# Patient Record
Sex: Female | Born: 1935 | Race: White | Hispanic: No | Smoking: Former smoker
Health system: Southern US, Community
[De-identification: ages and names within clinical notes are randomized; demographics above are authoritative.]

## PROBLEM LIST (undated history)

## (undated) DIAGNOSIS — D649 Anemia, unspecified: Secondary | ICD-10-CM

## (undated) DIAGNOSIS — I999 Unspecified disorder of circulatory system: Secondary | ICD-10-CM

## (undated) DIAGNOSIS — H9190 Unspecified hearing loss, unspecified ear: Secondary | ICD-10-CM

## (undated) DIAGNOSIS — R251 Tremor, unspecified: Secondary | ICD-10-CM

## (undated) DIAGNOSIS — M1990 Unspecified osteoarthritis, unspecified site: Secondary | ICD-10-CM

## (undated) DIAGNOSIS — R059 Cough, unspecified: Secondary | ICD-10-CM

## (undated) DIAGNOSIS — E039 Hypothyroidism, unspecified: Secondary | ICD-10-CM

## (undated) DIAGNOSIS — K219 Gastro-esophageal reflux disease without esophagitis: Secondary | ICD-10-CM

## (undated) DIAGNOSIS — I10 Essential (primary) hypertension: Secondary | ICD-10-CM

## (undated) DIAGNOSIS — F0390 Unspecified dementia without behavioral disturbance: Secondary | ICD-10-CM

## (undated) DIAGNOSIS — I4891 Unspecified atrial fibrillation: Secondary | ICD-10-CM

## (undated) DIAGNOSIS — J449 Chronic obstructive pulmonary disease, unspecified: Secondary | ICD-10-CM

## (undated) DIAGNOSIS — R05 Cough: Secondary | ICD-10-CM

## (undated) DIAGNOSIS — I872 Venous insufficiency (chronic) (peripheral): Secondary | ICD-10-CM

## (undated) HISTORY — DX: Unspecified disorder of circulatory system: I99.9

## (undated) HISTORY — DX: Venous insufficiency (chronic) (peripheral): I87.2

## (undated) HISTORY — PX: ABDOMINAL HYSTERECTOMY: SHX81

---

## 2004-01-20 ENCOUNTER — Ambulatory Visit: Payer: Self-pay | Admitting: Unknown Physician Specialty

## 2005-01-20 ENCOUNTER — Ambulatory Visit: Payer: Self-pay | Admitting: Unknown Physician Specialty

## 2005-02-11 ENCOUNTER — Ambulatory Visit: Payer: Self-pay | Admitting: Unknown Physician Specialty

## 2005-02-22 ENCOUNTER — Other Ambulatory Visit: Payer: Self-pay

## 2005-02-22 ENCOUNTER — Emergency Department: Payer: Self-pay | Admitting: Unknown Physician Specialty

## 2006-03-18 ENCOUNTER — Emergency Department: Payer: Self-pay | Admitting: Emergency Medicine

## 2006-03-18 ENCOUNTER — Other Ambulatory Visit: Payer: Self-pay

## 2007-01-01 ENCOUNTER — Emergency Department: Payer: Self-pay | Admitting: Emergency Medicine

## 2007-01-01 ENCOUNTER — Other Ambulatory Visit: Payer: Self-pay

## 2007-03-17 ENCOUNTER — Other Ambulatory Visit: Payer: Self-pay

## 2007-03-17 ENCOUNTER — Inpatient Hospital Stay: Payer: Self-pay | Admitting: Cardiology

## 2007-12-20 ENCOUNTER — Ambulatory Visit: Payer: Self-pay | Admitting: Unknown Physician Specialty

## 2011-05-24 ENCOUNTER — Ambulatory Visit: Payer: Self-pay | Admitting: Unknown Physician Specialty

## 2011-12-30 ENCOUNTER — Ambulatory Visit: Payer: Self-pay | Admitting: Unknown Physician Specialty

## 2013-05-21 ENCOUNTER — Inpatient Hospital Stay: Payer: Self-pay | Admitting: Internal Medicine

## 2013-05-21 ENCOUNTER — Ambulatory Visit: Payer: Self-pay

## 2013-05-21 LAB — URINALYSIS, COMPLETE
BILIRUBIN, UR: NEGATIVE
Glucose,UR: NEGATIVE mg/dL (ref 0–75)
KETONE: NEGATIVE
Leukocyte Esterase: NEGATIVE
Nitrite: NEGATIVE
PH: 6.5 (ref 4.5–8.0)
Protein: 30
SPECIFIC GRAVITY: 1.02 (ref 1.003–1.030)

## 2013-05-21 LAB — CBC
HCT: 32.5 % — AB (ref 35.0–47.0)
HGB: 11.2 g/dL — ABNORMAL LOW (ref 12.0–16.0)
MCH: 31.1 pg (ref 26.0–34.0)
MCHC: 34.6 g/dL (ref 32.0–36.0)
MCV: 90 fL (ref 80–100)
Platelet: 251 10*3/uL (ref 150–440)
RBC: 3.61 10*6/uL — ABNORMAL LOW (ref 3.80–5.20)
RDW: 12.5 % (ref 11.5–14.5)
WBC: 17.8 10*3/uL — ABNORMAL HIGH (ref 3.6–11.0)

## 2013-05-21 LAB — COMPREHENSIVE METABOLIC PANEL
ALK PHOS: 94 U/L
ALT: 18 U/L (ref 12–78)
AST: 15 U/L (ref 15–37)
Albumin: 2.9 g/dL — ABNORMAL LOW (ref 3.4–5.0)
Anion Gap: 5 — ABNORMAL LOW (ref 7–16)
BILIRUBIN TOTAL: 1 mg/dL (ref 0.2–1.0)
BUN: 15 mg/dL (ref 7–18)
CALCIUM: 8.9 mg/dL (ref 8.5–10.1)
CHLORIDE: 96 mmol/L — AB (ref 98–107)
CO2: 30 mmol/L (ref 21–32)
Creatinine: 0.61 mg/dL (ref 0.60–1.30)
EGFR (Non-African Amer.): >60
Glucose: 111 mg/dL — ABNORMAL HIGH (ref 65–99)
OSMOLALITY: 264 (ref 275–301)
Potassium: 2.9 mmol/L — ABNORMAL LOW (ref 3.5–5.1)
Sodium: 131 mmol/L — ABNORMAL LOW (ref 136–145)
Total Protein: 6.5 g/dL (ref 6.4–8.2)

## 2013-05-21 LAB — TSH: Thyroid Stimulating Horm: 0.211 u[IU]/mL — ABNORMAL LOW

## 2013-05-21 LAB — PRO B NATRIURETIC PEPTIDE: B-TYPE NATIURETIC PEPTID: 1760 pg/mL — AB (ref 0–450)

## 2013-05-21 LAB — TROPONIN I

## 2013-05-21 LAB — MAGNESIUM: MAGNESIUM: 1.4 mg/dL — AB

## 2013-05-21 LAB — DIGOXIN LEVEL: Digoxin: 0.82 ng/mL

## 2013-05-21 LAB — RAPID INFLUENZA A&B ANTIGENS (ARMC ONLY)

## 2013-05-21 LAB — T4, FREE: FREE THYROXINE: 1.27 ng/dL (ref 0.76–1.46)

## 2013-05-22 LAB — CBC WITH DIFFERENTIAL/PLATELET
BASOS PCT: 0.3 %
Basophil #: 0 10*3/uL (ref 0.0–0.1)
EOS PCT: 0.4 %
Eosinophil #: 0.1 10*3/uL (ref 0.0–0.7)
HCT: 31.7 % — ABNORMAL LOW (ref 35.0–47.0)
HGB: 11 g/dL — ABNORMAL LOW (ref 12.0–16.0)
LYMPHS ABS: 1.4 10*3/uL (ref 1.0–3.6)
Lymphocyte %: 9.6 %
MCH: 31 pg (ref 26.0–34.0)
MCHC: 34.8 g/dL (ref 32.0–36.0)
MCV: 89 fL (ref 80–100)
MONO ABS: 1.3 x10 3/mm — AB (ref 0.2–0.9)
Monocyte %: 9.3 %
Neutrophil #: 11.4 10*3/uL — ABNORMAL HIGH (ref 1.4–6.5)
Neutrophil %: 80.4 %
Platelet: 245 10*3/uL (ref 150–440)
RBC: 3.56 10*6/uL — ABNORMAL LOW (ref 3.80–5.20)
RDW: 12.5 % (ref 11.5–14.5)
WBC: 14.2 10*3/uL — AB (ref 3.6–11.0)

## 2013-05-22 LAB — BASIC METABOLIC PANEL
Anion Gap: 4 — ABNORMAL LOW (ref 7–16)
BUN: 9 mg/dL (ref 7–18)
CHLORIDE: 99 mmol/L (ref 98–107)
CREATININE: 0.58 mg/dL — AB (ref 0.60–1.30)
Calcium, Total: 8.1 mg/dL — ABNORMAL LOW (ref 8.5–10.1)
Co2: 31 mmol/L (ref 21–32)
EGFR (African American): >60
Glucose: 120 mg/dL — ABNORMAL HIGH (ref 65–99)
Osmolality: 268 (ref 275–301)
Potassium: 3.5 mmol/L (ref 3.5–5.1)
Sodium: 134 mmol/L — ABNORMAL LOW (ref 136–145)

## 2013-05-22 LAB — MAGNESIUM: MAGNESIUM: 2.1 mg/dL

## 2013-05-23 LAB — URINE CULTURE

## 2013-05-23 LAB — BASIC METABOLIC PANEL
ANION GAP: 3 — AB (ref 7–16)
BUN: 5 mg/dL — ABNORMAL LOW (ref 7–18)
CO2: 33 mmol/L — AB (ref 21–32)
CREATININE: 0.48 mg/dL — AB (ref 0.60–1.30)
Calcium, Total: 8.1 mg/dL — ABNORMAL LOW (ref 8.5–10.1)
Chloride: 102 mmol/L (ref 98–107)
EGFR (African American): >60
GLUCOSE: 117 mg/dL — AB (ref 65–99)
Osmolality: 274 (ref 275–301)
Potassium: 3.2 mmol/L — ABNORMAL LOW (ref 3.5–5.1)
Sodium: 138 mmol/L (ref 136–145)

## 2013-05-26 LAB — CULTURE, BLOOD (SINGLE)

## 2013-06-15 ENCOUNTER — Emergency Department: Payer: Self-pay | Admitting: Emergency Medicine

## 2013-06-22 ENCOUNTER — Emergency Department: Payer: Self-pay | Admitting: Emergency Medicine

## 2013-07-25 ENCOUNTER — Ambulatory Visit: Payer: Self-pay | Admitting: Internal Medicine

## 2013-10-11 DIAGNOSIS — Z720 Tobacco use: Secondary | ICD-10-CM | POA: Insufficient documentation

## 2014-01-08 ENCOUNTER — Emergency Department: Payer: Self-pay | Admitting: Emergency Medicine

## 2014-01-08 LAB — BASIC METABOLIC PANEL
Anion Gap: 6 — ABNORMAL LOW (ref 7–16)
BUN: 8 mg/dL (ref 7–18)
CREATININE: 0.54 mg/dL — AB (ref 0.60–1.30)
Calcium, Total: 8.7 mg/dL (ref 8.5–10.1)
Chloride: 102 mmol/L (ref 98–107)
Co2: 31 mmol/L (ref 21–32)
EGFR (African American): >60
EGFR (Non-African Amer.): >60
Glucose: 93 mg/dL (ref 65–99)
OSMOLALITY: 276 (ref 275–301)
Potassium: 3.1 mmol/L — ABNORMAL LOW (ref 3.5–5.1)
SODIUM: 139 mmol/L (ref 136–145)

## 2014-01-08 LAB — CBC WITH DIFFERENTIAL/PLATELET
Basophil #: 0.1 10*3/uL (ref 0.0–0.1)
Basophil %: 0.6 %
Eosinophil #: 0.1 10*3/uL (ref 0.0–0.7)
Eosinophil %: 0.8 %
HCT: 37.2 % (ref 35.0–47.0)
HGB: 12.3 g/dL (ref 12.0–16.0)
LYMPHS ABS: 1.5 10*3/uL (ref 1.0–3.6)
LYMPHS PCT: 16.5 %
MCH: 31.2 pg (ref 26.0–34.0)
MCHC: 33.2 g/dL (ref 32.0–36.0)
MCV: 94 fL (ref 80–100)
MONOS PCT: 10.7 %
Monocyte #: 1 x10 3/mm — ABNORMAL HIGH (ref 0.2–0.9)
NEUTROS ABS: 6.7 10*3/uL — AB (ref 1.4–6.5)
NEUTROS PCT: 71.4 %
Platelet: 181 10*3/uL (ref 150–440)
RBC: 3.95 10*6/uL (ref 3.80–5.20)
RDW: 12.8 % (ref 11.5–14.5)
WBC: 9.3 10*3/uL (ref 3.6–11.0)

## 2014-01-13 LAB — CULTURE, BLOOD (SINGLE)

## 2014-04-10 DIAGNOSIS — D3617 Benign neoplasm of peripheral nerves and autonomic nervous system of trunk, unspecified: Secondary | ICD-10-CM | POA: Diagnosis not present

## 2014-04-10 DIAGNOSIS — L280 Lichen simplex chronicus: Secondary | ICD-10-CM | POA: Diagnosis not present

## 2014-04-10 DIAGNOSIS — L720 Epidermal cyst: Secondary | ICD-10-CM | POA: Diagnosis not present

## 2014-04-10 DIAGNOSIS — D485 Neoplasm of uncertain behavior of skin: Secondary | ICD-10-CM | POA: Diagnosis not present

## 2014-05-08 DIAGNOSIS — H6123 Impacted cerumen, bilateral: Secondary | ICD-10-CM | POA: Diagnosis not present

## 2014-05-22 DIAGNOSIS — E041 Nontoxic single thyroid nodule: Secondary | ICD-10-CM | POA: Diagnosis not present

## 2014-05-29 DIAGNOSIS — E041 Nontoxic single thyroid nodule: Secondary | ICD-10-CM | POA: Diagnosis not present

## 2014-06-08 NOTE — Discharge Summary (Signed)
PATIENT NAME:  Patricia Foley, Patricia Foley MR#:  [id number] DATE OF BIRTH:  [birth year]  DATE OF ADMISSION:  05/21/2013 DATE OF DISCHARGE:  05/23/2013   ADMITTING PHYSICIAN: Ceasar Lund. Anselm Jungling, MD  DISCHARGING PHYSICIAN: Gladstone Lighter, MD  PRIMARY CARE PHYSICIAN: Used to be Dr. Kem Kays in the past, but currently will be seeing a physician from [facility].  CONSULTATIONS IN THE HOSPITAL: Cardiology consultation by Dr. Ubaldo Glassing.   DISCHARGE DIAGNOSES: 1.  Sepsis.  2.  Left lower lobe pneumonia.  3.  Diastolic congestive heart failure.  4.  Paroxysmal atrial fibrillation.  5.  Coronary artery disease.  6.  Severe hypokalemia and hypomagnesemia.   DISCHARGE HOME MEDICATIONS:  1.  Multivitamin 1 tablet p.o. daily.  2.  Calcium carbonate 600 mg p.o. daily.  3.  Fish oil capsule 1 capsule daily.  4.  Cartia 300 mg p.o. daily.  5.  Potassium chloride 20 mEq p.o. daily.  6.  Lasix 20 mg p.o. every other day.  7.  Levaquin 500 mg p.o. once a day for 7 more days.   DISCHARGE OXYGEN: None.   DISCHARGE DIET: Low-sodium diet.   DISCHARGE ACTIVITY: As tolerated.    FOLLOWUP INSTRUCTIONS: 1.  PCP followup in 1 week.  2.  Cardiology followup in 1 week.  3.  Home health physical therapy.  LABORATORY AND IMAGING STUDIES PRIOR TO DISCHARGE: WBC 14.2, hemoglobin 11.0, hematocrit 31.7, platelet count 245.   Sodium 138, potassium 3.2, chloride 102, bicarbonate 33, BUN 5, creatinine 0.48, glucose of 117.  Chest x-ray showing left lower lobe pneumonia and small left pleural effusion.   Urine Legionella antigen is negative.   Echocardiogram showing LV ejection fraction is 70-75%. Concentric LVH  noted. Moderate pleural effusion in the left lateral region noted.  Influenza test is negative. TSH is low at 0.21; however, T4 is within normal limits. Digoxin level is within normal limits. Blood cultures are negative.   BRIEF HOSPITAL COURSE: [name] is an 81 year old Caucasian  female who lives at home by herself. Past medical history of coronary artery disease, chronic atrial fibrillation. Presented to the hospital secondary to sepsis from pneumonia.  1.  Sepsis secondary to left lower lobe pneumonia: Was admitted. Blood cultures were negative. Was on antibiotics with Levaquin. She was initially requiring oxygen at 2 liters, and with the antibiotics, her symptoms have improved significantly. She is off oxygen, ambulating well on room air, and is being discharged on p.o. Levaquin at this time.  2.  Chronic paroxysmal atrial fibrillation: She did have severe electrolyte abnormalities when she came in. Magnesium and potassium were low, which were replaced. Thyroid levels were within normal limits. She is currently converted back to sinus with some arrhythmia and was seen by Dr. Ubaldo Glassing. She is on Cardizem at home and also digoxin, which are being continued. However, her echo shows EF is 70-75%. She did have history of ischemic heart disease. She also has left pleural effusion, for which she is on p.o. Lasix, which has been changed to every other day now. She is also being discharged on potassium supplements.   The patient has done while she is in the hospital. She worked with physical therapy who recommended home health PT.   DISCHARGE CONDITION: Stable.   DISCHARGE DISPOSITION: Home with home health.  TIME SPENT ON DISCHARGE: 40 minutes.   ____________________________ Gladstone Lighter, MD rk:jcm D: 05/23/2013 14:35:02 ET T: 05/23/2013 15:29:16 ET JOB#: [id number]  cc: Gladstone Lighter, MD, <Dictator> Gladstone Lighter MD ELECTRONICALLY  SIGNED 06/05/2013 11:20

## 2014-06-08 NOTE — H&P (Signed)
PATIENT NAME:  Patricia Foley, Patricia Foley MR#:  [id number] DATE OF BIRTH:  [birth year]  DATE OF ADMISSION:  05/21/2013  PRIMARY CARE PHYSICIAN: In the past was seeing Dr. Doy Hutching and Kem Kays, but had not seen them for the last few years; therefore, she is seeing me.    REFERRING PHYSICIAN: Dr. Jasmine December.   CHIEF COMPLAINT: Cough and tachycardia.   HISTORY OF PRESENT ILLNESS: An 81 year old female who has past history of atrial fibrillation who had complaint of feeling fatigued, upper respiratory infection symptoms, like cough and somewhat short of breath, persisted to feel tired, gradually getting worse with some fever now, and so she was taken to [facility] [place] where they did chest x-ray and checked her vitals.  Her chest x-ray was positive for pneumonia and she had fever of 101.4 degrees Fahrenheit. She also had tachycardia over there of 128 heart rate, and so she was sent to Emergency Room for further management. In the ER, because of her tachycardia, which was found on EKG atrial flutter, she was given injection of Cardizem 10 mg and to that she responded; her heart rate came down to 90s and 100, but her blood pressure dropped, systolic up to 80, and so they started her on some IV fluids and antibiotic for pneumonia and called hospitalist service for further management. On further questioning, she said that yes to fever and chills. She is a smoker and was feeling somewhat short of breath for the last few weeks. Denies any chest pain or palpitation history.   REVIEW OF SYSTEMS: CONSTITUTIONAL: Negative for fever, fatigue, weakness. No pain or weight loss.  EYES: No blurring, double vision, discharge or redness.  EARS, NOSE, THROAT: No tinnitus, ear pain. No hearing loss.  RESPIRATORY: The patient had some cough and somewhat tightness in the chest with shortness of breath.  CARDIOVASCULAR: No chest pain, orthopnea, edema, arrhythmia, palpitations.  GASTROINTESTINAL: No nausea, vomiting,  diarrhea or abdominal pain.  GENITOURINARY: No dysuria, hematuria or increased frequency.  ENDOCRINE: No heat or cold intolerance.  SKIN: No acne or rashes or any lesions.  MUSCULOSKELETAL: No pain or swelling in the joints.  NEUROLOGICAL: No numbness, weakness, tremor or vertigo.  PSYCHIATRIC: No anxiety, insomnia, bipolar disorder.   PAST MEDICAL HISTORY:  Atrial fibrillation, history of ischemic heart disease.   SOCIAL HISTORY: Lives at home. She is a smoker but does not require oxygen at home. Denies alcohol or illegal drug use.   FAMILY HISTORY: Sister, mother and father all had coronary artery disease.   HOME MEDICATIONS: As per son:  1.  Aspirin 325 mg once a day.  2.  Cardizem 300 mg 24-hour capsule once a day.  3.  Digoxin 125 mcg oral tablet once a day.  4.  Furosemide 20 mg oral once a day.   PHYSICAL EXAMINATION: VITAL SIGNS: In ER, temperature 98.5, but as per the records from [facility] [place], temperature was 101.4. Pulse rate 132, came down to 90 after injection. Systolic blood pressure 235/57; after injection of Cardizem, came down to 80/43 and currently 109/50. Pulse ox  95% on room air.  GENERAL: The patient is fully alert and oriented to time, place and person. Does not appear in any acute distress.  HEENT: Head and neck atraumatic. Conjunctivae pink. Oral mucosa moist.  NECK: Supple. No JVD.  RESPIRATORY: Bilateral equal air entry. Some crepitation present.  CARDIOVASCULAR: S1, S2 present. Irregular. No murmur.  ABDOMEN: Soft, nontender. Bowel sounds present. No organomegaly.  SKIN: No  rashes.  EXTREMITIES:  Legs: No edema.  NEUROLOGICAL: Power 5/5. Follows commands. Moves all 4 limbs. No tremor.  PSYCHIATRIC: Does not appear in any acute psychiatric illness.  JOINTS:  No swelling or tenderness.   IMPORTANT LABORATORY RESULTS: BNP is 1760. Glucose is 111. BUN is 15, creatinine 0.61, sodium 131, potassium 2.9. Chloride is 96. Magnesium is 1.4.    TSH is  0.211.   WBC  is 17.8. Hemoglobin is 11.2. Platelet count is 251, MCV is 90.    Chest x-ray, PA and lateral, shows suspected left lower lobe pneumonia with volume loss and effusion.   ASSESSMENT AND PLAN: An 81 year old female with a past medical history of atrial fibrillation and ischemic heart disease, who is a current smoker, sent from [facility] [place] with pneumonia, tachycardia and fever.  1. Sepsis secondary to pneumonia. We are going to give her broad-spectrum antibiotics of vancomycin, Zosyn and levaquin.  We will send her blood culture and sputum culture for further guiding the therapy.  2. Atrial flutter with tachycardia. Heart rate came under control now with 1 injection of Cardizem; never required to be on drip. We will continue oral Cardizem and call cardiology consult for further management. She was taking digoxin. We will check digoxin level over here. TSH level is low, so we will check free T3 and free T4 level.  3.  Hypotension. Blood pressure was borderline hypotension in 55V and 74M systolic; currently, it came up to 100, so we will continue monitoring her on telemetry, and if required, then she might need to be started on some gentle hydration to help her keep the blood pressure stable.  4.  Electrolyte imbalance. Her potassium level 2.9 and magnesium is 1.4. We will replace IV and check tomorrow.  5. Elevated BNP. Chest x-ray does not give signs of CHF, but it might be a possibility. We cannot give Lasix right now because of her hypotension, and we will do echocardiogram tomorrow and follow serial troponins.  6. smoking- tobacco abuse- councelled for 4 min to quit.   offered nicotin patch.  CODE STATUS: FULL CODE.   Total critical care time spent on this admission is 60 minutes.    ____________________________ Ceasar Lund Anselm Jungling, MD vgv:[id number] D: 05/21/2013 19:20:00 ET T: 05/21/2013 19:53:04 ET JOB#: [id number]  cc: Ceasar Lund. Anselm Jungling, MD,  <Dictator> Vaughan Basta MD ELECTRONICALLY SIGNED 05/21/2013 20:55

## 2014-06-08 NOTE — Consult Note (Signed)
   Present Illness 81 year old female with history of atrial fibrillation rate is in the being noted to be short of breath with cough and fever. She presented to an acute care which was felt to have possible pneumonia noted to have atrial fibrillation rapid ventricular response. She was sent to the emergency room her electrocardiogram revealed atrial fibrillation with rapid ventricular response. Chest x-ray suggested probable left lower lobe pneumonia. Her rate is improved. She is on empiric antibiotics.she was initially ggiven IV Cardizem. She was kept off of further rate related medications due to relative hypotension. Her blood pressure has improved. She is currently feeling back to her baseline. her chads score is 1.   Physical Exam:  GEN no acute distress   HEENT PERRL, hearing intact to voice   NECK supple   RESP wheezing  rhonchi   CARD Irregular rate and rhythm  Murmur   Murmur Systolic   Systolic Murmur axilla   ABD denies tenderness  no hernia   LYMPH negative neck   EXTR negative cyanosis/clubbing, negative edema   SKIN normal to palpation   NEURO cranial nerves intact, motor/sensory function intact   PSYCH A+O to time, place, person   Review of Systems:  Subjective/Chief Complaint shortness of breath fever and chills   General: Fatigue  Fever/chills   Skin: No Complaints   ENT: No Complaints   Eyes: No Complaints   Neck: No Complaints   Respiratory: Frequent cough  Short of breath   Cardiovascular: Palpitations   Gastrointestinal: No Complaints   Genitourinary: No Complaints   Vascular: No Complaints   Musculoskeletal: No Complaints   Neurologic: No Complaints   Hematologic: No Complaints   Endocrine: No Complaints   Psychiatric: No Complaints   Review of Systems: All other systems were reviewed and found to be negative   EKG:  Interpretation atrial fibrillation with variable ventricular response    Sulfa drugs: Other   Impression  81 year old female with history of a tribulation now admitted with left lower lobe pneumonia. She initially was in atrial fibrillation with a rapid ventricular response. She was given IV diltiazem in the ER with improvement in her heart rate however also had anyr a blood pressure drop. She has not been placed on further antiarrhythmics. She is not currently on anticoagulation. Her chest score is 1 for age. She does not appear to have heart failure. She has no diabetes or previous stroke. We continue to closely follow her rate as the diabetes is being treated. Would defer anticoagulation currently unless further complications occur.   Plan 1. Continue to treat pneumonia with empiric antibiotics 2. . consideration of p.r.n. digoxin use or bradycardia trial of Cardizem should she develop rapid ventricular response 3. Would defer chronic anticoagulation until outpatient to discuss risk and benefits. 4.r will follow her with you   Electronic Signatures: Teodoro Spray (MD)  (Signed 07-Apr-15 15:14)  Authored: General Aspect/Present Illness, History and Physical Exam, Review of System, EKG , Allergies, Impression/Plan   Last Updated: 07-Apr-15 15:14 by Teodoro Spray (MD)

## 2014-07-17 ENCOUNTER — Ambulatory Visit
Admission: EM | Admit: 2014-07-17 | Discharge: 2014-07-17 | Disposition: A | Discharge status: Home / Self Care | Payer: Commercial Managed Care - HMO | Attending: Family Medicine | Admitting: Family Medicine

## 2014-07-17 DIAGNOSIS — L03115 Cellulitis of right lower limb: Secondary | ICD-10-CM | POA: Diagnosis not present

## 2014-07-17 MED ORDER — CEFAZOLIN (ANCEF) 1 G IV SOLR
1.0000 g | Freq: Once | INTRAVENOUS | Status: AC
  Administered 2014-07-17: 1 g

## 2014-07-17 MED ORDER — CEPHALEXIN 500 MG PO CAPS: 500.0000 mg | ORAL_CAPSULE | Freq: Three times a day (TID) | ORAL | Status: DC

## 2014-07-17 NOTE — Discharge Instructions (Signed)
Elevated right leg/foot Warm compresses to areaCellulitis Cellulitis is an infection of the skin and the tissue beneath it. The infected area is usually red and tender. Cellulitis occurs most often in the arms and lower legs.  CAUSES  Cellulitis is caused by bacteria that enter the skin through cracks or cuts in the skin. The most common types of bacteria that cause cellulitis are staphylococci and streptococci. SIGNS AND SYMPTOMS   Redness and warmth.  Swelling.  Tenderness or pain.  Fever. DIAGNOSIS  Your health care provider can usually determine what is wrong based on a physical exam. Blood tests may also be done. TREATMENT  Treatment usually involves taking an antibiotic medicine. HOME CARE INSTRUCTIONS   Take your antibiotic medicine as directed by your health care provider. Finish the antibiotic even if you start to feel better.  Keep the infected arm or leg elevated to reduce swelling.  Apply a warm cloth to the affected area up to 4 times per day to relieve pain.  Take medicines only as directed by your health care provider.  Keep all follow-up visits as directed by your health care provider. SEEK MEDICAL CARE IF:   You notice red streaks coming from the infected area.  Your red area gets larger or turns dark in color.  Your bone or joint underneath the infected area becomes painful after the skin has healed.  Your infection returns in the same area or another area.  You notice a swollen bump in the infected area.  You develop new symptoms.  You have a fever. SEEK IMMEDIATE MEDICAL CARE IF:   You feel very sleepy.  You develop vomiting or diarrhea.  You have a general ill feeling (malaise) with muscle aches and pains. MAKE SURE YOU:   Understand these instructions.  Will watch your condition.  Will get help right away if you are not doing well or get worse. Document Released: 11/11/2004 Document Revised: 06/18/2013 Document Reviewed:  04/19/2011 [facility] Patient Information 2015 [place], [place]. This information is not intended to replace advice given to you by your health care provider. Make sure you discuss any questions you have with your health care provider.

## 2014-07-17 NOTE — ED Notes (Signed)
X 3 days, associated with erythremia and pain with ambulation. Pt denies trauma to the affected area.

## 2014-07-17 NOTE — ED Provider Notes (Addendum)
CSN: [id number]     Arrival date & time 07/17/14  1641 History   First MD Initiated Contact with Patient 07/17/14 1742     Chief Complaint  Patient presents with  . Leg Swelling   (Consider location/radiation/quality/duration/timing/severity/associated sxs/prior Treatment) HPI Comments: 81 yo female with a 3-4 days h/o right lower leg and foot pain, redness and swelling. Denies any trauma, injuries, falls, fevers, chills, drainage.  The history is provided by the patient.    No past medical history on file. Past Surgical History  Procedure Laterality Date  . Abdominal hysterectomy    . Carotid stent     No family history on file. History  Substance Use Topics  . Smoking status: Current Every Day Smoker -- 1.00 packs/day for 50 years  . Smokeless tobacco: Never Used  . Alcohol Use: No   OB History    No data available     Review of Systems  Allergies  Review of patient's allergies indicates no known allergies.  Home Medications   Prior to Admission medications   Medication Sig Start Date End Date Taking? Authorizing Provider  aspirin 81 MG tablet Take 81 mg by mouth daily.   Yes Historical Provider, MD  Calcium Carbonate-Vitamin D (CALCIUM + D PO) Take by mouth.   Yes Historical Provider, MD  Diltiazem HCl Coated Beads (CARDIZEM CD PO) Take by mouth.   Yes Historical Provider, MD  FUROSEMIDE PO Take by mouth.   Yes Historical Provider, MD  Multiple Vitamins-Minerals (WOMENS MULTIVITAMIN PLUS PO) Take by mouth.   Yes Historical Provider, MD  Omega-3 Fatty Acids (FISH OIL PO) Take by mouth.   Yes Historical Provider, MD  potassium chloride SA (K-DUR,KLOR-CON) 20 MEQ tablet Take 20 mEq by mouth 2 (two) times daily.   Yes Historical Provider, MD  cephALEXin (KEFLEX) 500 MG capsule Take 1 capsule (500 mg total) by mouth 3 (three) times daily. 07/17/14   Norval Gable, MD   BP 139/55 mmHg  Temp(Src) 97.6 F (36.4 C) (Oral)  Resp 16  Ht 5\' 3"  (1.6 m)  Wt 121 lb (54.885 kg)   BMI 21.44 kg/m2  SpO2 96% Physical Exam  Constitutional: She appears well-developed and well-nourished. No distress.  Skin: She is not diaphoretic. There is erythema.  Warmth, blanchable erythema and tenderness to palpation on the right dorsum of foot and lower leg skin to approx 6cm above ankle joint  Nursing note and vitals reviewed.   ED Course  Procedures (including critical care time) Labs Review Labs Reviewed - No data to display  Imaging Review No results found.   MDM   1. Cellulitis of leg, right   2. Cellulitis of foot, right    Discharge Medication List as of 07/17/2014  6:18 PM    START taking these medications   Details  cephALEXin (KEFLEX) 500 MG capsule Take 1 capsule (500 mg total) by mouth 3 (three) times daily., Starting 07/17/2014, Until Discontinued, Normal        Plan: 1. Diagnosis reviewed with patient 2. rx as per orders; risks, benefits, potential side effects reviewed with patient 3. Recommend supportive treatment with warm compresses to area 4. Patient given Ancef 1gm IM x 1 in clinic 5. F/u prn if symptoms worsen or don't improve  Norval Gable, MD 07/17/14 [place], MD 07/17/14 [place], MD 07/19/14 [phone]

## 2014-07-19 ENCOUNTER — Other Ambulatory Visit: Payer: Self-pay | Admitting: Physician Assistant

## 2014-07-19 ENCOUNTER — Ambulatory Visit: Payer: Commercial Managed Care - HMO

## 2014-07-19 DIAGNOSIS — M25571 Pain in right ankle and joints of right foot: Secondary | ICD-10-CM | POA: Diagnosis not present

## 2014-07-19 DIAGNOSIS — R600 Localized edema: Secondary | ICD-10-CM | POA: Diagnosis not present

## 2014-07-19 DIAGNOSIS — M19071 Primary osteoarthritis, right ankle and foot: Secondary | ICD-10-CM | POA: Diagnosis not present

## 2014-07-19 DIAGNOSIS — M79671 Pain in right foot: Secondary | ICD-10-CM | POA: Diagnosis not present

## 2014-07-20 ENCOUNTER — Encounter: Payer: Self-pay | Admitting: Emergency Medicine

## 2014-07-20 ENCOUNTER — Emergency Department: Payer: Commercial Managed Care - HMO

## 2014-07-20 ENCOUNTER — Other Ambulatory Visit: Payer: Self-pay

## 2014-07-20 ENCOUNTER — Emergency Department
Admission: EM | Admit: 2014-07-20 | Discharge: 2014-07-20 | Disposition: A | Discharge status: Home / Self Care | Payer: Commercial Managed Care - HMO | Attending: Emergency Medicine | Admitting: Emergency Medicine

## 2014-07-20 DIAGNOSIS — J449 Chronic obstructive pulmonary disease, unspecified: Secondary | ICD-10-CM | POA: Diagnosis not present

## 2014-07-20 DIAGNOSIS — Z79899 Other long term (current) drug therapy: Secondary | ICD-10-CM | POA: Insufficient documentation

## 2014-07-20 DIAGNOSIS — Z720 Tobacco use: Secondary | ICD-10-CM | POA: Diagnosis not present

## 2014-07-20 DIAGNOSIS — R079 Chest pain, unspecified: Secondary | ICD-10-CM | POA: Insufficient documentation

## 2014-07-20 DIAGNOSIS — Z792 Long term (current) use of antibiotics: Secondary | ICD-10-CM | POA: Diagnosis not present

## 2014-07-20 DIAGNOSIS — L03115 Cellulitis of right lower limb: Secondary | ICD-10-CM | POA: Insufficient documentation

## 2014-07-20 DIAGNOSIS — Z7982 Long term (current) use of aspirin: Secondary | ICD-10-CM | POA: Insufficient documentation

## 2014-07-20 DIAGNOSIS — R2241 Localized swelling, mass and lump, right lower limb: Secondary | ICD-10-CM | POA: Diagnosis not present

## 2014-07-20 HISTORY — DX: Unspecified atrial fibrillation: I48.91

## 2014-07-20 HISTORY — DX: Chronic obstructive pulmonary disease, unspecified: J44.9

## 2014-07-20 LAB — BASIC METABOLIC PANEL
ANION GAP: 8 (ref 5–15)
BUN: 11 mg/dL (ref 6–20)
CALCIUM: 9.6 mg/dL (ref 8.9–10.3)
CO2: 32 mmol/L (ref 22–32)
CREATININE: 0.57 mg/dL (ref 0.44–1.00)
Chloride: 103 mmol/L (ref 101–111)
GFR calc Af Amer: >60 mL/min (ref >60)
GLUCOSE: 92 mg/dL (ref 65–99)
Potassium: 3.2 mmol/L — ABNORMAL LOW (ref 3.5–5.1)
Sodium: 143 mmol/L (ref 135–145)

## 2014-07-20 LAB — CBC
HEMATOCRIT: 37 % (ref 35.0–47.0)
Hemoglobin: 12.7 g/dL (ref 12.0–16.0)
MCH: 31.4 pg (ref 26.0–34.0)
MCHC: 34.5 g/dL (ref 32.0–36.0)
MCV: 91 fL (ref 80.0–100.0)
Platelets: 222 10*3/uL (ref 150–440)
RBC: 4.07 MIL/uL (ref 3.80–5.20)
RDW: 13 % (ref 11.5–14.5)
WBC: 8.2 10*3/uL (ref 3.6–11.0)

## 2014-07-20 LAB — TROPONIN I: Troponin I: <0.03 ng/mL (ref <0.031)

## 2014-07-20 MED ORDER — CLINDAMYCIN HCL 150 MG PO CAPS: 450.0000 mg | ORAL_CAPSULE | Freq: Four times a day (QID) | ORAL | Status: AC

## 2014-07-20 NOTE — ED Notes (Signed)
Finishing triaging pt, about to draw blood when she puts her hand up to her chest; when asked if pt is having chest pain she says she was but it's already gone; "I think my heartbeat is just changing";

## 2014-07-20 NOTE — Discharge Instructions (Signed)
Please seek medical attention for any high fevers, chest pain, shortness of breath, change in behavior, persistent vomiting, bloody stool or any other new or concerning symptoms.  Cellulitis Cellulitis is an infection of the skin and the tissue beneath it. The infected area is usually red and tender. Cellulitis occurs most often in the arms and lower legs.  CAUSES  Cellulitis is caused by bacteria that enter the skin through cracks or cuts in the skin. The most common types of bacteria that cause cellulitis are staphylococci and streptococci. SIGNS AND SYMPTOMS   Redness and warmth.  Swelling.  Tenderness or pain.  Fever. DIAGNOSIS  Your health care provider can usually determine what is wrong based on a physical exam. Blood tests may also be done. TREATMENT  Treatment usually involves taking an antibiotic medicine. HOME CARE INSTRUCTIONS   Take your antibiotic medicine as directed by your health care provider. Finish the antibiotic even if you start to feel better.  Keep the infected arm or leg elevated to reduce swelling.  Apply a warm cloth to the affected area up to 4 times per day to relieve pain.  Take medicines only as directed by your health care provider.  Keep all follow-up visits as directed by your health care provider. SEEK MEDICAL CARE IF:   You notice red streaks coming from the infected area.  Your red area gets larger or turns dark in color.  Your bone or joint underneath the infected area becomes painful after the skin has healed.  Your infection returns in the same area or another area.  You notice a swollen bump in the infected area.  You develop new symptoms.  You have a fever. SEEK IMMEDIATE MEDICAL CARE IF:   You feel very sleepy.  You develop vomiting or diarrhea.  You have a general ill feeling (malaise) with muscle aches and pains. MAKE SURE YOU:   Understand these instructions.  Will watch your condition.  Will get help right away  if you are not doing well or get worse. Document Released: 11/11/2004 Document Revised: 06/18/2013 Document Reviewed: 04/19/2011 [facility] Patient Information 2015 [place], [place]. This information is not intended to replace advice given to you by your health care provider. Make sure you discuss any questions you have with your health care provider.

## 2014-07-20 NOTE — ED Notes (Addendum)
Pt with right foot pain and right leg swelling for a week, slowly worsening over the last few days; seen yesterday at [facility] and was told to come to the ED to be evaluated for DVT after her husband's funeral today; pt says nothing specific worsens the pain; pt awake and alert; talking in complete coherent sentences; pt denies shortness of breath; pt is to be taking Furosemide 20mg  daily but has not taken in several days

## 2014-07-20 NOTE — ED Provider Notes (Signed)
[facility] Emergency Department Provider Note   ____________________________________________  Time seen: 2030  I have reviewed the triage vital signs and the nursing notes.   HISTORY  Chief Complaint Leg Swelling; Foot Pain; and Chest Pain   History limited by: Not Limited   HPI Patricia Foley is a 81 y.o. female presents to the emergency department with right foot swelling. The patient has been noticing right foot pain and swelling for roughly 1 week. It has been getting worse. She saw her primary care doctor and was prescribed Keflex. Additionally primary care doctor obtained an x-ray however tpatient does not know the results.the patient denies any fevers, vomiting or shortness of breath. In terms of chest pain patient states she simply feels that her heart skips a beat from time to time. Denies any true chest pain or shortness of breath.     Past Medical History  Diagnosis Date  . Atrial fibrillation   . COPD (chronic obstructive pulmonary disease)     There are no active problems to display for this patient.   Past Surgical History  Procedure Laterality Date  . Abdominal hysterectomy    . Carotid stent      Current Outpatient Rx  Name  Route  Sig  Dispense  Refill  . digoxin (LANOXIN) 0.125 MG tablet   Oral   Take by mouth daily.         Marland Kitchen aspirin 81 MG tablet   Oral   Take 81 mg by mouth daily.         . Calcium Carbonate-Vitamin D (CALCIUM + D PO)   Oral   Take by mouth.         . cephALEXin (KEFLEX) 500 MG capsule   Oral   Take 1 capsule (500 mg total) by mouth 3 (three) times daily.   40 capsule   0   . clindamycin (CLEOCIN) 150 MG capsule   Oral   Take 3 capsules (450 mg total) by mouth 4 (four) times daily.   84 capsule   0   . Diltiazem HCl Coated Beads (CARDIZEM CD PO)   Oral   Take by mouth.         . FUROSEMIDE PO   Oral   Take 20 mg by mouth daily.          . Multiple Vitamins-Minerals  (WOMENS MULTIVITAMIN PLUS PO)   Oral   Take by mouth.         . Omega-3 Fatty Acids (FISH OIL PO)   Oral   Take by mouth.         . potassium chloride SA (K-DUR,KLOR-CON) 20 MEQ tablet   Oral   Take 20 mEq by mouth 2 (two) times daily.           Allergies Sulfa antibiotics and Coumadin  History reviewed. No pertinent family history.  Social History History  Substance Use Topics  . Smoking status: Current Every Day Smoker -- 1.00 packs/day for 50 years  . Smokeless tobacco: Never Used  . Alcohol Use: No    Review of Systems  Constitutional: Negative for fever. Cardiovascular: Negative for chest pain. Respiratory: Negative for shortness of breath. Gastrointestinal: Negative for abdominal pain, vomiting and diarrhea. Genitourinary: Negative for dysuria. Musculoskeletal: rightfoot pain Skin: Negative for rash. Neurological: Negative for headaches, focal weakness or numbness.   10-point ROS otherwise negative.  ____________________________________________   PHYSICAL EXAM:  VITAL SIGNS: ED Triage Vitals  Enc Vitals Group  BP 07/20/14 1935 147/60 mmHg     Pulse Rate 07/20/14 1935 66     Resp 07/20/14 1935 18     Temp 07/20/14 1935 98.2 F (36.8 C)     Temp Source 07/20/14 1935 Oral     SpO2 07/20/14 1935 98 %     Weight 07/20/14 1935 121 lb (54.885 kg)     Height 07/20/14 1935 5\' 2"  (1.575 m)     Head Cir --      Peak Flow --      Pain Score 07/20/14 1936 3   Constitutional: Alert and oriented. Well appearing and in no distress. Eyes: Conjunctivae are normal. PERRL. Normal extraocular movements. ENT   Head: Normocephalic and atraumatic.   Nose: No congestion/rhinnorhea.   Mouth/Throat: Mucous membranes are moist.   Neck: No stridor. Hematological/Lymphatic/Immunilogical: No cervical lymphadenopathy. Cardiovascular: Normal rate, regular rhythm.  No murmurs, rubs, or gallops. Respiratory: Normal respiratory effort without tachypnea  nor retractions. Breath sounds are clear and equal bilaterally. No wheezes/rales/rhonchi. Gastrointestinal: Soft and nontender. No distention. . Genitourinary: Deferred Musculoskeletal: Normal range of motion in all extremities. No joint effusions.  Some redness around right anterior ankle and lower leg. No tenderness. No pain with passive range of motion.dorsalis pedis 2+ Neurologic:  Normal speech and language. No gross focal neurologic deficits are appreciated. Speech is normal.  Skin:  Skin is warm, dry and intact. No rash noted. Psychiatric: Mood and affect are normal. Speech and behavior are normal. Patient exhibits appropriate insight and judgment.  ____________________________________________    LABS (pertinent positives/negatives)  Labs Reviewed  BASIC METABOLIC PANEL - Abnormal; Notable for the following:    Potassium 3.2 (*)    All other components within normal limits  CBC  BRAIN NATRIURETIC PEPTIDE  TROPONIN I     ____________________________________________   EKG  I, Nance Pear, attending physician, personally viewed and interpreted this EKG  EKG Time: 2000 Rate: 57 Rhythm: sinus brady Axis: normal Intervals: qtc 408 QRS: normal ST changes: no st elevation    ____________________________________________    RADIOLOGY  Lower ext doppler IMPRESSION: 1. No evidence of deep venous thrombosis. 2. Enlarged right inguinal node, measuring 2.5 cm in short axis, of uncertain significance. This would be amenable to biopsy.  cxr IMPRESSION: No acute abnormality. Emphysema.  ____________________________________________   PROCEDURES  Procedure(s) performed: None  Critical Care performed: No  ____________________________________________   INITIAL IMPRESSION / ASSESSMENT AND PLAN / ED COURSE  Pertinent labs & imaging results that were available during my care of the patient were reviewed by me and considered in my medical decision making (see  chart for details).  Patient presents with redness and swelling to right foot and lower leg. Exam physical findings consistent with cellulitis. This point I highly doubt a septic joint given no tenderness, Effusions or pain with range of motion.the patient did have a Doppler study done of her left leg which did not show any clots. Will give patient prescription for clindamycin. Encourage follow-up with primary care doctor.  ____________________________________________   FINAL CLINICAL IMPRESSION(S) / ED DIAGNOSES  Final diagnoses:  Cellulitis of right lower extremity     Nance Pear, MD 07/21/14 8502

## 2014-07-21 LAB — BRAIN NATRIURETIC PEPTIDE: B Natriuretic Peptide: 92 pg/mL (ref 0.0–100.0)

## 2014-07-22 ENCOUNTER — Ambulatory Visit: Payer: Commercial Managed Care - HMO

## 2014-07-24 DIAGNOSIS — L03115 Cellulitis of right lower limb: Secondary | ICD-10-CM | POA: Diagnosis not present

## 2014-07-24 DIAGNOSIS — I480 Paroxysmal atrial fibrillation: Secondary | ICD-10-CM | POA: Diagnosis not present

## 2014-07-24 DIAGNOSIS — R590 Localized enlarged lymph nodes: Secondary | ICD-10-CM | POA: Diagnosis not present

## 2014-07-24 DIAGNOSIS — I10 Essential (primary) hypertension: Secondary | ICD-10-CM | POA: Diagnosis not present

## 2014-07-29 DIAGNOSIS — L03115 Cellulitis of right lower limb: Secondary | ICD-10-CM | POA: Diagnosis not present

## 2014-09-05 DIAGNOSIS — I10 Essential (primary) hypertension: Secondary | ICD-10-CM | POA: Diagnosis not present

## 2014-09-05 DIAGNOSIS — I83893 Varicose veins of bilateral lower extremities with other complications: Secondary | ICD-10-CM | POA: Diagnosis not present

## 2014-09-05 DIAGNOSIS — R600 Localized edema: Secondary | ICD-10-CM | POA: Diagnosis not present

## 2014-09-05 DIAGNOSIS — I480 Paroxysmal atrial fibrillation: Secondary | ICD-10-CM | POA: Diagnosis not present

## 2014-09-07 DIAGNOSIS — I83893 Varicose veins of bilateral lower extremities with other complications: Secondary | ICD-10-CM | POA: Insufficient documentation

## 2014-10-03 DIAGNOSIS — I890 Lymphedema, not elsewhere classified: Secondary | ICD-10-CM | POA: Diagnosis not present

## 2014-10-03 DIAGNOSIS — M79609 Pain in unspecified limb: Secondary | ICD-10-CM | POA: Diagnosis not present

## 2014-10-03 DIAGNOSIS — I872 Venous insufficiency (chronic) (peripheral): Secondary | ICD-10-CM | POA: Diagnosis not present

## 2014-10-03 DIAGNOSIS — M7989 Other specified soft tissue disorders: Secondary | ICD-10-CM | POA: Diagnosis not present

## 2014-10-03 DIAGNOSIS — I8310 Varicose veins of unspecified lower extremity with inflammation: Secondary | ICD-10-CM | POA: Diagnosis not present

## 2014-10-18 DIAGNOSIS — I10 Essential (primary) hypertension: Secondary | ICD-10-CM | POA: Diagnosis not present

## 2014-10-18 DIAGNOSIS — R946 Abnormal results of thyroid function studies: Secondary | ICD-10-CM | POA: Diagnosis not present

## 2014-10-23 DIAGNOSIS — I10 Essential (primary) hypertension: Secondary | ICD-10-CM | POA: Diagnosis not present

## 2014-10-23 DIAGNOSIS — R590 Localized enlarged lymph nodes: Secondary | ICD-10-CM | POA: Diagnosis not present

## 2014-10-23 DIAGNOSIS — I83893 Varicose veins of bilateral lower extremities with other complications: Secondary | ICD-10-CM | POA: Diagnosis not present

## 2014-10-23 DIAGNOSIS — I480 Paroxysmal atrial fibrillation: Secondary | ICD-10-CM | POA: Diagnosis not present

## 2014-11-14 DIAGNOSIS — I890 Lymphedema, not elsewhere classified: Secondary | ICD-10-CM | POA: Diagnosis not present

## 2014-11-14 DIAGNOSIS — I872 Venous insufficiency (chronic) (peripheral): Secondary | ICD-10-CM | POA: Diagnosis not present

## 2014-11-14 DIAGNOSIS — I8310 Varicose veins of unspecified lower extremity with inflammation: Secondary | ICD-10-CM | POA: Diagnosis not present

## 2014-11-14 DIAGNOSIS — M79609 Pain in unspecified limb: Secondary | ICD-10-CM | POA: Diagnosis not present

## 2014-11-14 DIAGNOSIS — M7989 Other specified soft tissue disorders: Secondary | ICD-10-CM | POA: Diagnosis not present

## 2015-02-28 DIAGNOSIS — I480 Paroxysmal atrial fibrillation: Secondary | ICD-10-CM | POA: Diagnosis not present

## 2015-02-28 DIAGNOSIS — E041 Nontoxic single thyroid nodule: Secondary | ICD-10-CM | POA: Diagnosis not present

## 2015-03-05 DIAGNOSIS — G301 Alzheimer's disease with late onset: Secondary | ICD-10-CM

## 2015-03-05 DIAGNOSIS — I10 Essential (primary) hypertension: Secondary | ICD-10-CM | POA: Diagnosis not present

## 2015-03-05 DIAGNOSIS — R600 Localized edema: Secondary | ICD-10-CM | POA: Insufficient documentation

## 2015-03-05 DIAGNOSIS — I480 Paroxysmal atrial fibrillation: Secondary | ICD-10-CM | POA: Diagnosis not present

## 2015-03-05 DIAGNOSIS — F0280 Dementia in other diseases classified elsewhere without behavioral disturbance: Secondary | ICD-10-CM | POA: Insufficient documentation

## 2015-03-12 DIAGNOSIS — E0590 Thyrotoxicosis, unspecified without thyrotoxic crisis or storm: Secondary | ICD-10-CM | POA: Diagnosis not present

## 2015-03-12 DIAGNOSIS — E042 Nontoxic multinodular goiter: Secondary | ICD-10-CM | POA: Diagnosis not present

## 2015-04-09 DIAGNOSIS — I10 Essential (primary) hypertension: Secondary | ICD-10-CM | POA: Diagnosis not present

## 2015-04-09 DIAGNOSIS — I480 Paroxysmal atrial fibrillation: Secondary | ICD-10-CM | POA: Diagnosis not present

## 2015-04-16 DIAGNOSIS — I10 Essential (primary) hypertension: Secondary | ICD-10-CM | POA: Diagnosis not present

## 2015-04-16 DIAGNOSIS — I480 Paroxysmal atrial fibrillation: Secondary | ICD-10-CM | POA: Diagnosis not present

## 2015-04-16 DIAGNOSIS — F0280 Dementia in other diseases classified elsewhere without behavioral disturbance: Secondary | ICD-10-CM | POA: Diagnosis not present

## 2015-04-16 DIAGNOSIS — R600 Localized edema: Secondary | ICD-10-CM | POA: Diagnosis not present

## 2015-04-16 DIAGNOSIS — G301 Alzheimer's disease with late onset: Secondary | ICD-10-CM | POA: Diagnosis not present

## 2015-05-28 DIAGNOSIS — M79675 Pain in left toe(s): Secondary | ICD-10-CM | POA: Diagnosis not present

## 2015-05-28 DIAGNOSIS — L851 Acquired keratosis [keratoderma] palmaris et plantaris: Secondary | ICD-10-CM | POA: Diagnosis not present

## 2015-05-28 DIAGNOSIS — M2011 Hallux valgus (acquired), right foot: Secondary | ICD-10-CM | POA: Diagnosis not present

## 2015-05-28 DIAGNOSIS — M79674 Pain in right toe(s): Secondary | ICD-10-CM | POA: Diagnosis not present

## 2015-05-28 DIAGNOSIS — B351 Tinea unguium: Secondary | ICD-10-CM | POA: Diagnosis not present

## 2015-07-23 DIAGNOSIS — F0280 Dementia in other diseases classified elsewhere without behavioral disturbance: Secondary | ICD-10-CM | POA: Diagnosis not present

## 2015-07-23 DIAGNOSIS — G301 Alzheimer's disease with late onset: Secondary | ICD-10-CM | POA: Diagnosis not present

## 2015-07-23 DIAGNOSIS — K649 Unspecified hemorrhoids: Secondary | ICD-10-CM | POA: Diagnosis not present

## 2015-07-23 DIAGNOSIS — I10 Essential (primary) hypertension: Secondary | ICD-10-CM | POA: Diagnosis not present

## 2015-07-23 DIAGNOSIS — R600 Localized edema: Secondary | ICD-10-CM | POA: Diagnosis not present

## 2015-07-26 DIAGNOSIS — K649 Unspecified hemorrhoids: Secondary | ICD-10-CM | POA: Insufficient documentation

## 2015-10-04 ENCOUNTER — Encounter (INDEPENDENT_AMBULATORY_CARE_PROVIDER_SITE_OTHER): Payer: Self-pay

## 2015-10-04 DIAGNOSIS — M79606 Pain in leg, unspecified: Secondary | ICD-10-CM

## 2015-10-04 DIAGNOSIS — M79609 Pain in unspecified limb: Secondary | ICD-10-CM | POA: Insufficient documentation

## 2015-10-04 DIAGNOSIS — I890 Lymphedema, not elsewhere classified: Secondary | ICD-10-CM

## 2015-10-04 DIAGNOSIS — I872 Venous insufficiency (chronic) (peripheral): Secondary | ICD-10-CM | POA: Insufficient documentation

## 2015-10-23 DIAGNOSIS — K649 Unspecified hemorrhoids: Secondary | ICD-10-CM | POA: Diagnosis not present

## 2015-10-23 DIAGNOSIS — F0280 Dementia in other diseases classified elsewhere without behavioral disturbance: Secondary | ICD-10-CM | POA: Diagnosis not present

## 2015-10-23 DIAGNOSIS — I480 Paroxysmal atrial fibrillation: Secondary | ICD-10-CM | POA: Diagnosis not present

## 2015-10-23 DIAGNOSIS — G301 Alzheimer's disease with late onset: Secondary | ICD-10-CM | POA: Diagnosis not present

## 2015-10-23 DIAGNOSIS — I10 Essential (primary) hypertension: Secondary | ICD-10-CM | POA: Diagnosis not present

## 2015-10-23 DIAGNOSIS — Z131 Encounter for screening for diabetes mellitus: Secondary | ICD-10-CM | POA: Diagnosis not present

## 2015-11-20 ENCOUNTER — Ambulatory Visit (INDEPENDENT_AMBULATORY_CARE_PROVIDER_SITE_OTHER): Payer: Self-pay | Admitting: Vascular Surgery

## 2016-02-04 DIAGNOSIS — G301 Alzheimer's disease with late onset: Secondary | ICD-10-CM | POA: Diagnosis not present

## 2016-02-04 DIAGNOSIS — R0982 Postnasal drip: Secondary | ICD-10-CM | POA: Diagnosis not present

## 2016-02-04 DIAGNOSIS — I10 Essential (primary) hypertension: Secondary | ICD-10-CM | POA: Diagnosis not present

## 2016-02-04 DIAGNOSIS — I480 Paroxysmal atrial fibrillation: Secondary | ICD-10-CM | POA: Diagnosis not present

## 2016-02-04 DIAGNOSIS — G250 Essential tremor: Secondary | ICD-10-CM | POA: Diagnosis not present

## 2016-02-04 DIAGNOSIS — F0280 Dementia in other diseases classified elsewhere without behavioral disturbance: Secondary | ICD-10-CM | POA: Diagnosis not present

## 2016-03-18 DIAGNOSIS — I10 Essential (primary) hypertension: Secondary | ICD-10-CM | POA: Diagnosis not present

## 2016-03-18 DIAGNOSIS — R7309 Other abnormal glucose: Secondary | ICD-10-CM | POA: Diagnosis not present

## 2016-03-18 DIAGNOSIS — I480 Paroxysmal atrial fibrillation: Secondary | ICD-10-CM | POA: Diagnosis not present

## 2016-03-18 DIAGNOSIS — Z131 Encounter for screening for diabetes mellitus: Secondary | ICD-10-CM | POA: Diagnosis not present

## 2016-03-26 DIAGNOSIS — G250 Essential tremor: Secondary | ICD-10-CM | POA: Diagnosis not present

## 2016-03-26 DIAGNOSIS — F0280 Dementia in other diseases classified elsewhere without behavioral disturbance: Secondary | ICD-10-CM | POA: Diagnosis not present

## 2016-03-26 DIAGNOSIS — R946 Abnormal results of thyroid function studies: Secondary | ICD-10-CM | POA: Diagnosis not present

## 2016-03-26 DIAGNOSIS — R441 Visual hallucinations: Secondary | ICD-10-CM | POA: Diagnosis not present

## 2016-03-26 DIAGNOSIS — I10 Essential (primary) hypertension: Secondary | ICD-10-CM | POA: Diagnosis not present

## 2016-03-26 DIAGNOSIS — G301 Alzheimer's disease with late onset: Secondary | ICD-10-CM | POA: Diagnosis not present

## 2016-03-26 DIAGNOSIS — F5104 Psychophysiologic insomnia: Secondary | ICD-10-CM | POA: Diagnosis not present

## 2016-03-27 DIAGNOSIS — F5104 Psychophysiologic insomnia: Secondary | ICD-10-CM | POA: Insufficient documentation

## 2016-03-27 DIAGNOSIS — G250 Essential tremor: Secondary | ICD-10-CM | POA: Insufficient documentation

## 2016-03-30 ENCOUNTER — Other Ambulatory Visit: Payer: Self-pay | Admitting: Internal Medicine

## 2016-03-30 DIAGNOSIS — G301 Alzheimer's disease with late onset: Principal | ICD-10-CM

## 2016-03-30 DIAGNOSIS — F0280 Dementia in other diseases classified elsewhere without behavioral disturbance: Secondary | ICD-10-CM

## 2016-03-30 DIAGNOSIS — R441 Visual hallucinations: Secondary | ICD-10-CM

## 2016-04-01 DIAGNOSIS — H2589 Other age-related cataract: Secondary | ICD-10-CM | POA: Diagnosis not present

## 2016-04-08 ENCOUNTER — Ambulatory Visit
Admission: RE | Admit: 2016-04-08 | Discharge: 2016-04-08 | Disposition: A | Discharge status: Home / Self Care | Payer: Medicare HMO | Source: Ambulatory Visit | Attending: Internal Medicine | Admitting: Internal Medicine

## 2016-04-08 DIAGNOSIS — D320 Benign neoplasm of cerebral meninges: Secondary | ICD-10-CM | POA: Insufficient documentation

## 2016-04-08 DIAGNOSIS — F0390 Unspecified dementia without behavioral disturbance: Secondary | ICD-10-CM | POA: Diagnosis not present

## 2016-04-08 DIAGNOSIS — R443 Hallucinations, unspecified: Secondary | ICD-10-CM | POA: Diagnosis not present

## 2016-04-08 DIAGNOSIS — G301 Alzheimer's disease with late onset: Secondary | ICD-10-CM | POA: Diagnosis not present

## 2016-04-08 DIAGNOSIS — R441 Visual hallucinations: Secondary | ICD-10-CM | POA: Diagnosis not present

## 2016-04-08 DIAGNOSIS — F0280 Dementia in other diseases classified elsewhere without behavioral disturbance: Secondary | ICD-10-CM | POA: Diagnosis not present

## 2016-04-13 DIAGNOSIS — I480 Paroxysmal atrial fibrillation: Secondary | ICD-10-CM | POA: Diagnosis not present

## 2016-04-13 DIAGNOSIS — R441 Visual hallucinations: Secondary | ICD-10-CM | POA: Diagnosis not present

## 2016-04-13 DIAGNOSIS — G301 Alzheimer's disease with late onset: Secondary | ICD-10-CM | POA: Diagnosis not present

## 2016-04-13 DIAGNOSIS — F0280 Dementia in other diseases classified elsewhere without behavioral disturbance: Secondary | ICD-10-CM | POA: Diagnosis not present

## 2016-04-13 DIAGNOSIS — R413 Other amnesia: Secondary | ICD-10-CM | POA: Diagnosis not present

## 2016-04-13 DIAGNOSIS — F5104 Psychophysiologic insomnia: Secondary | ICD-10-CM | POA: Diagnosis not present

## 2016-04-23 DIAGNOSIS — H2589 Other age-related cataract: Secondary | ICD-10-CM | POA: Diagnosis not present

## 2016-04-29 ENCOUNTER — Encounter: Payer: Self-pay | Admitting: *Deleted

## 2016-05-04 ENCOUNTER — Encounter
Admission: RE | Disposition: A | Discharge status: Home / Self Care | Payer: Self-pay | Source: Ambulatory Visit | Attending: Ophthalmology

## 2016-05-04 ENCOUNTER — Ambulatory Visit: Payer: Medicare HMO | Admitting: Anesthesiology

## 2016-05-04 ENCOUNTER — Encounter: Payer: Self-pay | Admitting: *Deleted

## 2016-05-04 ENCOUNTER — Ambulatory Visit
Admission: RE | Admit: 2016-05-04 | Discharge: 2016-05-04 | Disposition: A | Discharge status: Home / Self Care | Payer: Medicare HMO | Source: Ambulatory Visit | Attending: Ophthalmology | Admitting: Ophthalmology

## 2016-05-04 DIAGNOSIS — H2511 Age-related nuclear cataract, right eye: Secondary | ICD-10-CM | POA: Insufficient documentation

## 2016-05-04 DIAGNOSIS — J449 Chronic obstructive pulmonary disease, unspecified: Secondary | ICD-10-CM | POA: Diagnosis not present

## 2016-05-04 DIAGNOSIS — F17210 Nicotine dependence, cigarettes, uncomplicated: Secondary | ICD-10-CM | POA: Insufficient documentation

## 2016-05-04 DIAGNOSIS — M1990 Unspecified osteoarthritis, unspecified site: Secondary | ICD-10-CM | POA: Diagnosis not present

## 2016-05-04 DIAGNOSIS — E039 Hypothyroidism, unspecified: Secondary | ICD-10-CM | POA: Insufficient documentation

## 2016-05-04 DIAGNOSIS — H2589 Other age-related cataract: Secondary | ICD-10-CM | POA: Diagnosis not present

## 2016-05-04 DIAGNOSIS — I10 Essential (primary) hypertension: Secondary | ICD-10-CM | POA: Diagnosis not present

## 2016-05-04 DIAGNOSIS — I4891 Unspecified atrial fibrillation: Secondary | ICD-10-CM | POA: Diagnosis not present

## 2016-05-04 DIAGNOSIS — K219 Gastro-esophageal reflux disease without esophagitis: Secondary | ICD-10-CM | POA: Diagnosis not present

## 2016-05-04 HISTORY — DX: Hypothyroidism, unspecified: E03.9

## 2016-05-04 HISTORY — DX: Unspecified hearing loss, unspecified ear: H91.90

## 2016-05-04 HISTORY — PX: CATARACT EXTRACTION W/PHACO: SHX586

## 2016-05-04 HISTORY — DX: Anemia, unspecified: D64.9

## 2016-05-04 HISTORY — DX: Essential (primary) hypertension: I10

## 2016-05-04 HISTORY — DX: Tremor, unspecified: R25.1

## 2016-05-04 HISTORY — DX: Unspecified dementia, unspecified severity, without behavioral disturbance, psychotic disturbance, mood disturbance, and anxiety: F03.90

## 2016-05-04 HISTORY — DX: Gastro-esophageal reflux disease without esophagitis: K21.9

## 2016-05-04 HISTORY — DX: Unspecified osteoarthritis, unspecified site: M19.90

## 2016-05-04 SURGERY — PHACOEMULSIFICATION, CATARACT, WITH IOL INSERTION
Anesthesia: Monitor Anesthesia Care | Site: Eye | Laterality: Right | Wound class: Clean

## 2016-05-04 MED ORDER — SODIUM CHLORIDE 0.9 % IV SOLN
INTRAVENOUS | Status: DC
  Administered 2016-05-04: 07:00:00 via INTRAVENOUS

## 2016-05-04 MED ORDER — NA CHONDROIT SULF-NA HYALURON 40-17 MG/ML IO SOLN
INTRAOCULAR | Status: AC
  Filled 2016-05-04: qty 1

## 2016-05-04 MED ORDER — TRYPAN BLUE 0.06 % OP SOLN
OPHTHALMIC | Status: AC
  Filled 2016-05-04: qty 0.5

## 2016-05-04 MED ORDER — POVIDONE-IODINE 5 % OP SOLN
OPHTHALMIC | Status: DC | PRN
  Administered 2016-05-04: 1 via OPHTHALMIC

## 2016-05-04 MED ORDER — EPINEPHRINE PF 1 MG/ML IJ SOLN
INTRAOCULAR | Status: DC | PRN
  Administered 2016-05-04: 08:00:00 via OPHTHALMIC

## 2016-05-04 MED ORDER — NA CHONDROIT SULF-NA HYALURON 40-17 MG/ML IO SOLN
INTRAOCULAR | Status: DC | PRN
  Administered 2016-05-04: 1 mL via INTRAOCULAR

## 2016-05-04 MED ORDER — LIDOCAINE HCL (PF) 2 % IJ SOLN
INTRAMUSCULAR | Status: AC
  Filled 2016-05-04: qty 2

## 2016-05-04 MED ORDER — ARMC OPHTHALMIC DILATING DROPS
OPHTHALMIC | Status: AC
  Filled 2016-05-04: qty 0.4

## 2016-05-04 MED ORDER — MOXIFLOXACIN HCL 0.5 % OP SOLN
OPHTHALMIC | Status: DC | PRN
  Administered 2016-05-04: 0.2 mL via OPHTHALMIC

## 2016-05-04 MED ORDER — CARBACHOL 0.01 % IO SOLN
INTRAOCULAR | Status: DC | PRN
  Administered 2016-05-04: 0.5 mL via INTRAOCULAR

## 2016-05-04 MED ORDER — POVIDONE-IODINE 5 % OP SOLN
OPHTHALMIC | Status: AC
  Filled 2016-05-04: qty 30

## 2016-05-04 MED ORDER — ARMC OPHTHALMIC DILATING DROPS
1.0000 "application " | OPHTHALMIC | Status: AC
  Administered 2016-05-04 (×3): 1 via OPHTHALMIC

## 2016-05-04 MED ORDER — EPINEPHRINE PF 1 MG/ML IJ SOLN
INTRAMUSCULAR | Status: AC
  Filled 2016-05-04: qty 2

## 2016-05-04 MED ORDER — MOXIFLOXACIN HCL 0.5 % OP SOLN
OPHTHALMIC | Status: AC
  Filled 2016-05-04: qty 3

## 2016-05-04 MED ORDER — BSS IO SOLN
INTRAOCULAR | Status: DC | PRN
  Administered 2016-05-04: 4 mL via OPHTHALMIC

## 2016-05-04 MED ORDER — MOXIFLOXACIN HCL 0.5 % OP SOLN: 1.0000 [drp] | OPHTHALMIC | Status: DC | PRN

## 2016-05-04 MED ORDER — TRYPAN BLUE 0.06 % OP SOLN
OPHTHALMIC | Status: DC | PRN
  Administered 2016-05-04: 0.5 mL via INTRAOCULAR

## 2016-05-04 MED ORDER — MIDAZOLAM HCL 2 MG/2ML IJ SOLN
INTRAMUSCULAR | Status: AC
  Filled 2016-05-04: qty 2

## 2016-05-04 SURGICAL SUPPLY — 21 items
CANNULA ANT/CHMB 27GA (MISCELLANEOUS) ×3 IMPLANT
CUP MEDICINE 2OZ PLAST GRAD ST (MISCELLANEOUS) ×3 IMPLANT
GLOVE BIO SURGEON STRL SZ8 (GLOVE) ×3 IMPLANT
GLOVE BIOGEL M 6.5 STRL (GLOVE) ×3 IMPLANT
GLOVE SURG LX 8.0 MICRO (GLOVE) ×2
GLOVE SURG LX STRL 8.0 MICRO (GLOVE) ×1 IMPLANT
GOWN STRL REUS W/ TWL LRG LVL3 (GOWN DISPOSABLE) ×2 IMPLANT
GOWN STRL REUS W/TWL LRG LVL3 (GOWN DISPOSABLE) ×4
LENS IOL TECNIS ITEC 19.0 (Intraocular Lens) ×3 IMPLANT
PACK CATARACT (MISCELLANEOUS) ×3 IMPLANT
PACK CATARACT BRASINGTON LX (MISCELLANEOUS) ×3 IMPLANT
PACK EYE AFTER SURG (MISCELLANEOUS) ×3 IMPLANT
SOL BSS BAG (MISCELLANEOUS) ×3
SOL PREP PVP 2OZ (MISCELLANEOUS) ×3
SOLUTION BSS BAG (MISCELLANEOUS) ×1 IMPLANT
SOLUTION PREP PVP 2OZ (MISCELLANEOUS) ×1 IMPLANT
SYR 3ML LL SCALE MARK (SYRINGE) ×3 IMPLANT
SYR 5ML LL (SYRINGE) ×3 IMPLANT
SYR TB 1ML 27GX1/2 LL (SYRINGE) ×3 IMPLANT
WATER STERILE IRR 250ML POUR (IV SOLUTION) ×3 IMPLANT
WIPE NON LINTING 3.25X3.25 (MISCELLANEOUS) ×3 IMPLANT

## 2016-05-04 NOTE — Anesthesia Post-op Follow-up Note (Cosign Needed)
Anesthesia QCDR form completed.        

## 2016-05-04 NOTE — Transfer of Care (Signed)
Immediate Anesthesia Transfer of Care Note  Patient: Patricia Foley  Procedure(s) Performed: Procedure(s) with comments: CATARACT EXTRACTION PHACO AND INTRAOCULAR LENS PLACEMENT ([place]) (Right) - [place] 3:02.7 AP% 23.1 CDE 42.15 Fluid pack lot # [id number]  Patient Location: PACU  Anesthesia Type:MAC  Level of Consciousness: awake and alert   Airway & Oxygen Therapy: Patient Spontanous Breathing  Post-op Assessment: Post -op Vital signs reviewed and stable  Post vital signs: stable  Last Vitals:  Vitals:   05/04/16 0653 05/04/16 0821  BP: (!) 154/62 (!) 155/51  Pulse: (!) 55 (!) 41  Resp: 18 12  Temp: (!) 35.9 C 36.4 C    Last Pain:  Vitals:   05/04/16 0821  TempSrc: Tympanic         Complications: No apparent anesthesia complications

## 2016-05-04 NOTE — Discharge Instructions (Signed)
Eye Surgery Discharge Instructions  Expect mild scratchy sensation or mild soreness. DO NOT RUB YOUR EYE!  The day of surgery:  Minimal physical activity, but bed rest is not required  No reading, computer work, or close hand work  No bending, lifting, or straining.  May watch TV  For 24 hours:  No driving, legal decisions, or alcoholic beverages  Safety precautions  Eat anything you prefer: It is better to start with liquids, then soup then solid foods.  _____ Eye patch should be worn until postoperative exam tomorrow.  ____ Solar shield eyeglasses should be worn for comfort in the sunlight/patch while sleeping  Resume all regular medications including aspirin or Coumadin if these were discontinued prior to surgery. You may shower, bathe, shave, or wash your hair. Tylenol may be taken for mild discomfort.  Call your doctor if you experience significant pain, nausea, or vomiting, fever > 101 or other signs of infection. [phone] or [phone] Specific instructions:  Follow-up Information    Patricia Foley,Patricia LOUIS, MD Follow up.   Specialty:  Ophthalmology Why:  March 21 at [facility] information: [address] [place] [address] [phone]

## 2016-05-04 NOTE — H&P (Signed)
All labs reviewed. Abnormal studies sent to patients PCP when indicated.  Previous H&P reviewed, patient examined, there are NO CHANGES.  Patricia Foley LOUIS3/20/20187:41 AM

## 2016-05-04 NOTE — Op Note (Signed)
PREOPERATIVE DIAGNOSIS:  Nuclear sclerotic cataract of the right eye.   POSTOPERATIVE DIAGNOSIS:  NUCLEAR SCLEROTIC CATARACT RIGHT EYE   OPERATIVE PROCEDURE: Procedure(s): CATARACT EXTRACTION PHACO AND INTRAOCULAR LENS PLACEMENT (IOC)   SURGEON:  Birder Robson, MD.   ANESTHESIA:  Anesthesiologist: Andria Frames, MD CRNA: Aline Brochure, CRNA  1.      Managed anesthesia care. 2.      0.49ml of Shugarcaine was instilled in the eye following the paracentesis.   COMPLICATIONS:  None.   TECHNIQUE:   Stop and chop   DESCRIPTION OF PROCEDURE:  The patient was examined and consented in the preoperative holding area where the aforementioned topical anesthesia was applied to the right eye and then brought back to the Operating Room where the right eye was prepped and draped in the usual sterile ophthalmic fashion and a lid speculum was placed. A paracentesis was created with the side port blade and the anterior chamber was filled with viscoelastic. A near clear corneal incision was performed with the steel keratome. A continuous curvilinear capsulorrhexis was performed with a cystotome followed by the capsulorrhexis forceps. Hydrodissection and hydrodelineation were carried out with BSS on a blunt cannula. The lens was removed in a stop and chop  technique and the remaining cortical material was removed with the irrigation-aspiration handpiece. The capsular bag was inflated with viscoelastic and the Technis [id number]  lens was placed in the capsular bag without complication. The remaining viscoelastic was removed from the eye with the irrigation-aspiration handpiece. The wounds were hydrated. The anterior chamber was flushed with Miostat and the eye was inflated to physiologic pressure. 0.1ml of Vigamox was placed in the anterior chamber. The wounds were found to be water tight. The eye was dressed with Vigamox. The patient was given protective glasses to wear throughout the day and a shield with  which to sleep tonight. The patient was also given drops with which to begin a drop regimen today and will follow-up with me in one day.  Implant Name Type Inv. Item Serial No. Manufacturer Lot No. LRB No. Used  LENS IOL DIOP 19.0 - [id number] Intraocular Lens LENS IOL DIOP 19.0 [id number] AMO   Right 1   Procedure(s) with comments: CATARACT EXTRACTION PHACO AND INTRAOCULAR LENS PLACEMENT (IOC) (Right) - [place] 3:02.7 AP% 23.1 CDE 42.15 Fluid pack lot # [id number]  Electronically signed: [place] 05/04/2016 8:18 AM

## 2016-05-04 NOTE — Anesthesia Preprocedure Evaluation (Signed)
Anesthesia Evaluation  Patient identified by MRN, date of birth, ID band Patient confused    Reviewed: Allergy & Precautions, H&P , NPO status , Patient's Chart, lab work & pertinent test results  History of Anesthesia Complications Negative for: history of anesthetic complications  Airway Mallampati: III  TM Distance: >3 FB Neck ROM: full    Dental  (+) Poor Dentition, Chipped, Caps   Pulmonary COPD, Current Smoker,    Pulmonary exam normal breath sounds clear to auscultation       Cardiovascular Exercise Tolerance: Good hypertension, (-) angina+ Peripheral Vascular Disease  (-) Past MI and (-) DOE Normal cardiovascular exam+ dysrhythmias Atrial Fibrillation  Rhythm:regular Rate:Normal     Neuro/Psych PSYCHIATRIC DISORDERS negative neurological ROS     GI/Hepatic Neg liver ROS, GERD  Controlled,  Endo/Other  negative endocrine ROSHypothyroidism   Renal/GU      Musculoskeletal  (+) Arthritis ,   Abdominal   Peds  Hematology negative hematology ROS (+)   Anesthesia Other Findings Past Medical History: No date: Anemia No date: Arthritis No date: Atrial fibrillation (HCC) No date: Circulation problem No date: COPD (chronic obstructive pulmonary disease) (* No date: Dementia No date: Dysrhythmia     Comment: AFIB No date: GERD (gastroesophageal reflux disease) No date: HOH (hard of hearing) No date: Hypertension No date: Hypothyroidism No date: Lymphedema No date: Swelling of limb No date: Tremors of nervous system No date: Venous insufficiency No date: Wheezing  Past Surgical History: No date: ABDOMINAL HYSTERECTOMY No date: CAROTID STENT  BMI    Body Mass Index:  22.22 kg/m      Reproductive/Obstetrics negative OB ROS                             Anesthesia Physical Anesthesia Plan  ASA: III  Anesthesia Plan: MAC   Post-op Pain Management:    Induction:    Airway Management Planned:   Additional Equipment:   Intra-op Plan:   Post-operative Plan:   Informed Consent: I have reviewed the patients History and Physical, chart, labs and discussed the procedure including the risks, benefits and alternatives for the proposed anesthesia with the patient or authorized representative who has indicated his/her understanding and acceptance.     Plan Discussed with: Anesthesiologist, CRNA and Surgeon  Anesthesia Plan Comments:         Anesthesia Quick Evaluation

## 2016-05-04 NOTE — Anesthesia Postprocedure Evaluation (Signed)
Anesthesia Post Note  Patient: Patricia Foley  Procedure(s) Performed: Procedure(s) (LRB): CATARACT EXTRACTION PHACO AND INTRAOCULAR LENS PLACEMENT ([place]) (Right)  Patient location during evaluation: PACU Anesthesia Type: MAC Level of consciousness: awake and alert Pain management: pain level controlled Vital Signs Assessment: post-procedure vital signs reviewed and stable Respiratory status: spontaneous breathing Cardiovascular status: stable Postop Assessment: no signs of nausea or vomiting Anesthetic complications: no     Last Vitals:  Vitals:   05/04/16 0818 05/04/16 0821  BP: (!) 155/51 (!) 155/51  Pulse: (!) 47 (!) 41  Resp: 16 12  Temp: 36.4 C 36.4 C    Last Pain:  Vitals:   05/04/16 0821  TempSrc: Tympanic                 Franchon Ketterman,  Clearnce Sorrel

## 2016-05-05 ENCOUNTER — Encounter: Payer: Self-pay | Admitting: Ophthalmology

## 2016-05-19 DIAGNOSIS — G8929 Other chronic pain: Secondary | ICD-10-CM | POA: Diagnosis not present

## 2016-05-19 DIAGNOSIS — M19071 Primary osteoarthritis, right ankle and foot: Secondary | ICD-10-CM | POA: Diagnosis not present

## 2016-05-19 DIAGNOSIS — M2011 Hallux valgus (acquired), right foot: Secondary | ICD-10-CM | POA: Diagnosis not present

## 2016-05-19 DIAGNOSIS — M25571 Pain in right ankle and joints of right foot: Secondary | ICD-10-CM | POA: Diagnosis not present

## 2016-05-19 DIAGNOSIS — M79671 Pain in right foot: Secondary | ICD-10-CM | POA: Diagnosis not present

## 2016-05-22 ENCOUNTER — Encounter: Payer: Self-pay | Admitting: Emergency Medicine

## 2016-05-22 ENCOUNTER — Emergency Department: Payer: Medicare HMO

## 2016-05-22 ENCOUNTER — Emergency Department
Admission: EM | Admit: 2016-05-22 | Discharge: 2016-05-22 | Disposition: A | Discharge status: Home / Self Care | Payer: Medicare HMO | Source: Home / Self Care | Attending: Emergency Medicine | Admitting: Emergency Medicine

## 2016-05-22 DIAGNOSIS — E876 Hypokalemia: Secondary | ICD-10-CM | POA: Diagnosis not present

## 2016-05-22 DIAGNOSIS — T502X5A Adverse effect of carbonic-anhydrase inhibitors, benzothiadiazides and other diuretics, initial encounter: Secondary | ICD-10-CM | POA: Diagnosis present

## 2016-05-22 DIAGNOSIS — R001 Bradycardia, unspecified: Secondary | ICD-10-CM | POA: Diagnosis not present

## 2016-05-22 DIAGNOSIS — M79604 Pain in right leg: Secondary | ICD-10-CM | POA: Diagnosis present

## 2016-05-22 DIAGNOSIS — L03115 Cellulitis of right lower limb: Secondary | ICD-10-CM

## 2016-05-22 DIAGNOSIS — Z90710 Acquired absence of both cervix and uterus: Secondary | ICD-10-CM | POA: Diagnosis not present

## 2016-05-22 DIAGNOSIS — J449 Chronic obstructive pulmonary disease, unspecified: Secondary | ICD-10-CM | POA: Insufficient documentation

## 2016-05-22 DIAGNOSIS — Z66 Do not resuscitate: Secondary | ICD-10-CM | POA: Diagnosis not present

## 2016-05-22 DIAGNOSIS — K219 Gastro-esophageal reflux disease without esophagitis: Secondary | ICD-10-CM | POA: Diagnosis present

## 2016-05-22 DIAGNOSIS — I081 Rheumatic disorders of both mitral and tricuspid valves: Secondary | ICD-10-CM | POA: Diagnosis present

## 2016-05-22 DIAGNOSIS — F17200 Nicotine dependence, unspecified, uncomplicated: Secondary | ICD-10-CM

## 2016-05-22 DIAGNOSIS — F0390 Unspecified dementia without behavioral disturbance: Secondary | ICD-10-CM | POA: Diagnosis not present

## 2016-05-22 DIAGNOSIS — I5032 Chronic diastolic (congestive) heart failure: Secondary | ICD-10-CM | POA: Diagnosis not present

## 2016-05-22 DIAGNOSIS — M25571 Pain in right ankle and joints of right foot: Secondary | ICD-10-CM | POA: Diagnosis not present

## 2016-05-22 DIAGNOSIS — L0390 Cellulitis, unspecified: Secondary | ICD-10-CM | POA: Diagnosis not present

## 2016-05-22 DIAGNOSIS — R2241 Localized swelling, mass and lump, right lower limb: Secondary | ICD-10-CM | POA: Diagnosis not present

## 2016-05-22 DIAGNOSIS — Z5181 Encounter for therapeutic drug level monitoring: Secondary | ICD-10-CM | POA: Insufficient documentation

## 2016-05-22 DIAGNOSIS — I110 Hypertensive heart disease with heart failure: Secondary | ICD-10-CM | POA: Diagnosis present

## 2016-05-22 DIAGNOSIS — I4891 Unspecified atrial fibrillation: Secondary | ICD-10-CM | POA: Diagnosis not present

## 2016-05-22 DIAGNOSIS — Z79899 Other long term (current) drug therapy: Secondary | ICD-10-CM | POA: Insufficient documentation

## 2016-05-22 DIAGNOSIS — I10 Essential (primary) hypertension: Secondary | ICD-10-CM

## 2016-05-22 DIAGNOSIS — I482 Chronic atrial fibrillation: Secondary | ICD-10-CM | POA: Diagnosis not present

## 2016-05-22 DIAGNOSIS — Z959 Presence of cardiac and vascular implant and graft, unspecified: Secondary | ICD-10-CM | POA: Diagnosis not present

## 2016-05-22 DIAGNOSIS — E039 Hypothyroidism, unspecified: Secondary | ICD-10-CM | POA: Diagnosis present

## 2016-05-22 DIAGNOSIS — T40605A Adverse effect of unspecified narcotics, initial encounter: Secondary | ICD-10-CM | POA: Diagnosis not present

## 2016-05-22 DIAGNOSIS — Z7401 Bed confinement status: Secondary | ICD-10-CM | POA: Diagnosis not present

## 2016-05-22 DIAGNOSIS — H9190 Unspecified hearing loss, unspecified ear: Secondary | ICD-10-CM | POA: Diagnosis present

## 2016-05-22 DIAGNOSIS — Z8249 Family history of ischemic heart disease and other diseases of the circulatory system: Secondary | ICD-10-CM | POA: Diagnosis not present

## 2016-05-22 DIAGNOSIS — Z888 Allergy status to other drugs, medicaments and biological substances status: Secondary | ICD-10-CM | POA: Diagnosis not present

## 2016-05-22 DIAGNOSIS — F17210 Nicotine dependence, cigarettes, uncomplicated: Secondary | ICD-10-CM | POA: Diagnosis present

## 2016-05-22 DIAGNOSIS — Z882 Allergy status to sulfonamides status: Secondary | ICD-10-CM | POA: Diagnosis not present

## 2016-05-22 DIAGNOSIS — I872 Venous insufficiency (chronic) (peripheral): Secondary | ICD-10-CM | POA: Diagnosis not present

## 2016-05-22 DIAGNOSIS — M1990 Unspecified osteoarthritis, unspecified site: Secondary | ICD-10-CM | POA: Diagnosis present

## 2016-05-22 DIAGNOSIS — R112 Nausea with vomiting, unspecified: Secondary | ICD-10-CM | POA: Diagnosis not present

## 2016-05-22 DIAGNOSIS — M19071 Primary osteoarthritis, right ankle and foot: Secondary | ICD-10-CM | POA: Diagnosis not present

## 2016-05-22 DIAGNOSIS — R251 Tremor, unspecified: Secondary | ICD-10-CM | POA: Diagnosis present

## 2016-05-22 DIAGNOSIS — M7989 Other specified soft tissue disorders: Secondary | ICD-10-CM | POA: Diagnosis not present

## 2016-05-22 LAB — SEDIMENTATION RATE: SED RATE: 60 mm/h — AB (ref 0–30)

## 2016-05-22 LAB — CBC WITH DIFFERENTIAL/PLATELET
Basophils Absolute: 0.1 10*3/uL (ref 0–0.1)
Basophils Relative: 1 %
EOS ABS: 0.1 10*3/uL (ref 0–0.7)
Eosinophils Relative: 1 %
HEMATOCRIT: 38.6 % (ref 35.0–47.0)
Hemoglobin: 13.2 g/dL (ref 12.0–16.0)
LYMPHS ABS: 2 10*3/uL (ref 1.0–3.6)
LYMPHS PCT: 17 %
MCH: 29.9 pg (ref 26.0–34.0)
MCHC: 34.3 g/dL (ref 32.0–36.0)
MCV: 87.1 fL (ref 80.0–100.0)
MONO ABS: 1.4 10*3/uL — AB (ref 0.2–0.9)
Monocytes Relative: 12 %
Neutro Abs: 8.3 10*3/uL — ABNORMAL HIGH (ref 1.4–6.5)
Neutrophils Relative %: 69 %
Platelets: 230 10*3/uL (ref 150–440)
RBC: 4.43 MIL/uL (ref 3.80–5.20)
RDW: 13.1 % (ref 11.5–14.5)
WBC: 11.9 10*3/uL — ABNORMAL HIGH (ref 3.6–11.0)

## 2016-05-22 LAB — COMPREHENSIVE METABOLIC PANEL
ALT: 11 U/L — ABNORMAL LOW (ref 14–54)
ANION GAP: 9 (ref 5–15)
AST: 21 U/L (ref 15–41)
Albumin: 3.8 g/dL (ref 3.5–5.0)
Alkaline Phosphatase: 76 U/L (ref 38–126)
BILIRUBIN TOTAL: 1.3 mg/dL — AB (ref 0.3–1.2)
BUN: 14 mg/dL (ref 6–20)
CO2: 38 mmol/L — ABNORMAL HIGH (ref 22–32)
Calcium: 9.3 mg/dL (ref 8.9–10.3)
Chloride: 89 mmol/L — ABNORMAL LOW (ref 101–111)
Creatinine, Ser: 0.67 mg/dL (ref 0.44–1.00)
Glucose, Bld: 105 mg/dL — ABNORMAL HIGH (ref 65–99)
POTASSIUM: 2.9 mmol/L — AB (ref 3.5–5.1)
Sodium: 136 mmol/L (ref 135–145)
Total Protein: 7.6 g/dL (ref 6.5–8.1)

## 2016-05-22 LAB — PROCALCITONIN: Procalcitonin: <0.1 ng/mL

## 2016-05-22 LAB — URINALYSIS, COMPLETE (UACMP) WITH MICROSCOPIC
Bacteria, UA: NONE SEEN
Bilirubin Urine: NEGATIVE
GLUCOSE, UA: NEGATIVE mg/dL
Hgb urine dipstick: NEGATIVE
KETONES UR: NEGATIVE mg/dL
Leukocytes, UA: NEGATIVE
Nitrite: NEGATIVE
PH: 6 (ref 5.0–8.0)
PROTEIN: NEGATIVE mg/dL
Specific Gravity, Urine: 1.018 (ref 1.005–1.030)
Squamous Epithelial / HPF: NONE SEEN

## 2016-05-22 LAB — PROTIME-INR
INR: 1.01
Prothrombin Time: 13.3 seconds (ref 11.4–15.2)

## 2016-05-22 MED ORDER — CEPHALEXIN 500 MG PO CAPS
500.0000 mg | ORAL_CAPSULE | Freq: Once | ORAL | Status: AC
  Administered 2016-05-22: 500 mg via ORAL
  Filled 2016-05-22: qty 1

## 2016-05-22 MED ORDER — CLINDAMYCIN HCL 150 MG PO CAPS
300.0000 mg | ORAL_CAPSULE | Freq: Once | ORAL | Status: AC
  Administered 2016-05-22: 300 mg via ORAL
  Filled 2016-05-22: qty 2

## 2016-05-22 MED ORDER — LIDOCAINE-EPINEPHRINE 1 %-1:100000 IJ SOLN
10.0000 mL | Freq: Once | INTRAMUSCULAR | Status: AC
  Administered 2016-05-22: 10 mL via INTRADERMAL
  Filled 2016-05-22: qty 10

## 2016-05-22 MED ORDER — CLINDAMYCIN HCL 150 MG PO CAPS: 300.0000 mg | ORAL_CAPSULE | Freq: Three times a day (TID) | ORAL | 0 refills | Status: AC

## 2016-05-22 MED ORDER — CEPHALEXIN 500 MG PO CAPS: 500.0000 mg | ORAL_CAPSULE | Freq: Four times a day (QID) | ORAL | 0 refills | Status: AC

## 2016-05-22 NOTE — ED Notes (Signed)
EDP at bedside  

## 2016-05-22 NOTE — ED Notes (Signed)
Per family they took patient to a urgent care for her foot and they said everything was fine. Per family swelling has continued without improvement and now patient unable to bare weight on foot/ankle.

## 2016-05-22 NOTE — Discharge Instructions (Signed)
Please take all of your antibiotics as prescribed and follow up with your primary care physician on Monday for a recheck. If you're unable to see her primary care physician it is critically important that you come back and see [place] in our emergency department as I need to have a physician evaluate you on Monday. Return to the ER sooner for any new or worsening symptoms such as worsening pain, fevers, chills, or for any other concerns.  It was a pleasure to take care of you today, and thank you for coming to our emergency department.  If you have any questions or concerns before leaving please ask the nurse to grab me and I'm more than happy to go through your aftercare instructions again.  If you were prescribed any opioid pain medication today such as Norco, Vicodin, Percocet, morphine, hydrocodone, or oxycodone please make sure you do not drive when you are taking this medication as it can alter your ability to drive safely.  If you have any concerns once you are home that you are not improving or are in fact getting worse before you can make it to your follow-up appointment, please do not hesitate to call 911 and come back for further evaluation.  Darel Hong MD  Results for orders placed or performed during the hospital encounter of 05/22/16  Procalcitonin  Result Value Ref Range   Procalcitonin <0.10 ng/mL  CBC WITH DIFFERENTIAL  Result Value Ref Range   WBC 11.9 (H) 3.6 - 11.0 K/uL   RBC 4.43 3.80 - 5.20 MIL/uL   Hemoglobin 13.2 12.0 - 16.0 g/dL   HCT 38.6 35.0 - 47.0 %   MCV 87.1 80.0 - 100.0 fL   MCH 29.9 26.0 - 34.0 pg   MCHC 34.3 32.0 - 36.0 g/dL   RDW 13.1 11.5 - 14.5 %   Platelets 230 150 - 440 K/uL   Neutrophils Relative % 69 %   Neutro Abs 8.3 (H) 1.4 - 6.5 K/uL   Lymphocytes Relative 17 %   Lymphs Abs 2.0 1.0 - 3.6 K/uL   Monocytes Relative 12 %   Monocytes Absolute 1.4 (H) 0.2 - 0.9 K/uL   Eosinophils Relative 1 %   Eosinophils Absolute 0.1 0 - 0.7 K/uL   Basophils  Relative 1 %   Basophils Absolute 0.1 0 - 0.1 K/uL  Comprehensive metabolic panel  Result Value Ref Range   Sodium 136 135 - 145 mmol/L   Potassium 2.9 (L) 3.5 - 5.1 mmol/L   Chloride 89 (L) 101 - 111 mmol/L   CO2 38 (H) 22 - 32 mmol/L   Glucose, Bld 105 (H) 65 - 99 mg/dL   BUN 14 6 - 20 mg/dL   Creatinine, Ser 0.67 0.44 - 1.00 mg/dL   Calcium 9.3 8.9 - 10.3 mg/dL   Total Protein 7.6 6.5 - 8.1 g/dL   Albumin 3.8 3.5 - 5.0 g/dL   AST 21 15 - 41 U/L   ALT 11 (L) 14 - 54 U/L   Alkaline Phosphatase 76 38 - 126 U/L   Total Bilirubin 1.3 (H) 0.3 - 1.2 mg/dL   GFR calc non Af Amer >60 >60 mL/min   GFR calc Af Amer >60 >60 mL/min   Anion gap 9 5 - 15  Protime-INR  Result Value Ref Range   Prothrombin Time 13.3 11.4 - 15.2 seconds   INR 1.01   Sedimentation rate  Result Value Ref Range   Sed Rate 60 (H) 0 - 30 mm/hr  Urinalysis, Complete w Microscopic  Result Value Ref Range   Color, Urine AMBER (A) YELLOW   APPearance HAZY (A) CLEAR   Specific Gravity, Urine 1.018 1.005 - 1.030   pH 6.0 5.0 - 8.0   Glucose, UA NEGATIVE NEGATIVE mg/dL   Hgb urine dipstick NEGATIVE NEGATIVE   Bilirubin Urine NEGATIVE NEGATIVE   Ketones, ur NEGATIVE NEGATIVE mg/dL   Protein, ur NEGATIVE NEGATIVE mg/dL   Nitrite NEGATIVE NEGATIVE   Leukocytes, UA NEGATIVE NEGATIVE   RBC / HPF 0-5 0 - 5 RBC/hpf   WBC, UA 0-5 0 - 5 WBC/hpf   Bacteria, UA NONE SEEN NONE SEEN   Squamous Epithelial / LPF NONE SEEN NONE SEEN   Mucous PRESENT    Dg Ankle Complete Right  Result Date: 05/22/2016 CLINICAL DATA:  Right foot/ankle pain x2 weeks, no known injury EXAM: RIGHT ANKLE - COMPLETE 3+ VIEW COMPARISON:  None. FINDINGS: No fracture or dislocation is seen. The ankle mortise is intact. The base of the fifth metatarsal is unremarkable. Mild diffuse soft tissue swelling. IMPRESSION: No fracture or dislocation is seen. Mild diffuse soft tissue swelling. Electronically Signed   By: Julian Hy M.D.   On: 05/22/2016  18:42   US Venous Img Lower Unilateral Right  Result Date: 05/22/2016 CLINICAL DATA:  Subacute onset of right ankle pain and swelling for 2 weeks. Initial encounter. EXAM: RIGHT LOWER EXTREMITY VENOUS DOPPLER ULTRASOUND TECHNIQUE: Gray-scale sonography with graded compression, as well as color Doppler and duplex ultrasound were performed to evaluate the lower extremity deep venous systems from the level of the common femoral vein and including the common femoral, femoral, profunda femoral, popliteal and calf veins including the posterior tibial, peroneal and gastrocnemius veins when visible. The superficial great saphenous vein was also interrogated. Spectral Doppler was utilized to evaluate flow at rest and with distal augmentation maneuvers in the common femoral, femoral and popliteal veins. COMPARISON:  Right ankle radiographs performed earlier today at 6:03 p.m. FINDINGS: Contralateral Common Femoral Vein: Respiratory phasicity is normal and symmetric with the symptomatic side. No evidence of thrombus. Normal compressibility. Common Femoral Vein: No evidence of thrombus. Normal compressibility, respiratory phasicity and response to augmentation. Saphenofemoral Junction: No evidence of thrombus. Normal compressibility and flow on color Doppler imaging. Profunda Femoral Vein: No evidence of thrombus. Normal compressibility and flow on color Doppler imaging. Femoral Vein: No evidence of thrombus. Normal compressibility, respiratory phasicity and response to augmentation. Popliteal Vein: No evidence of thrombus. Normal compressibility, respiratory phasicity and response to augmentation. Calf Veins: No evidence of thrombus. Normal compressibility and flow on color Doppler imaging. Superficial Great Saphenous Vein: No evidence of thrombus. Normal compressibility and flow on color Doppler imaging. Venous Reflux:  None. Other Findings: A prominent 1.8 cm node is noted at the right inguinal region. IMPRESSION: 1. No  evidence of deep venous thrombosis. 2. Prominent 1.8 cm node at the right inguinal region. Electronically Signed   By: Garald Balding M.D.   On: 05/22/2016 20:11

## 2016-05-22 NOTE — ED Triage Notes (Signed)
R foot pain increasing x 2 weeks, no known injury. States now is severe enough she cant stand.

## 2016-05-22 NOTE — ED Provider Notes (Signed)
[facility] Emergency Department Provider Note  ____________________________________________   First MD Initiated Contact with Patient 05/22/16 1757     (approximate)  I have reviewed the triage vital signs and the nursing notes.   HISTORY  Chief Complaint Foot Pain    HPI Patricia Foley is a 83 y.o. female who comes to the emergency department with 2 weeks of atraumatic right ankle pain.3 days ago she went to an urgent care where she had x-rays done and was told they were negative and she was discharged home. She continued to ambulate at home but her pain worsened in this morning she has been unable to move her ankle very much and unable to stand so her daughter and son-in-law brought her to the emergency department. History obtained largely from family as the patient has dementia. She has had no fevers or chills.   Past Medical History:  Diagnosis Date  . Anemia   . Arthritis   . Atrial fibrillation ([place])   . Circulation problem   . COPD (chronic obstructive pulmonary disease) ([place])   . Dementia   . Dysrhythmia    AFIB  . GERD (gastroesophageal reflux disease)   . HOH (hard of hearing)   . Hypertension   . Hypothyroidism   . Lymphedema   . Swelling of limb   . Tremors of nervous system   . Venous insufficiency   . Wheezing     Patient Active Problem List   Diagnosis Date Noted  . Chronic venous insufficiency 10/04/2015  . Lymphedema 10/04/2015  . Pain in limb 10/04/2015    Past Surgical History:  Procedure Laterality Date  . ABDOMINAL HYSTERECTOMY    . CAROTID STENT    . CATARACT EXTRACTION W/PHACO Right 05/04/2016   Procedure: CATARACT EXTRACTION PHACO AND INTRAOCULAR LENS PLACEMENT (IOC);  Surgeon: Birder Robson, MD;  Location: [facility] ORS;  Service: Ophthalmology;  Laterality: Right;  [place] 3:02.7 AP% 23.1 CDE 42.15 Fluid pack lot # [id number]    Prior to Admission medications   Medication Sig Start Date End Date Taking?  Authorizing Provider  aspirin 81 MG tablet Take 81 mg by mouth daily.   Yes Historical Provider, MD  Calcium Carbonate-Vitamin D (CALCIUM + D PO) Take by mouth.   Yes Historical Provider, MD  digoxin (LANOXIN) 0.125 MG tablet Take 0.125 mg by mouth daily.    Yes Historical Provider, MD  diltiazem (CARDIZEM CD) 300 MG 24 hr capsule Take 300 mg by mouth daily.    Yes Historical Provider, MD  donepezil (ARICEPT) 10 MG tablet Take 10 mg by mouth 2 (two) times daily.    Yes Historical Provider, MD  furosemide (LASIX) 40 MG tablet Take 40 mg by mouth daily.    Yes Historical Provider, MD  potassium chloride SA (K-DUR,KLOR-CON) 20 MEQ tablet Take 20 mEq by mouth daily.    Yes Historical Provider, MD  propranolol (INDERAL) 40 MG tablet Take 40 mg by mouth 2 (two) times daily.   Yes Historical Provider, MD  temazepam (RESTORIL) 7.5 MG capsule Take 7.5 mg by mouth at bedtime as needed for sleep.   Yes Historical Provider, MD  cephALEXin (KEFLEX) 500 MG capsule Take 1 capsule (500 mg total) by mouth 4 (four) times daily. 05/22/16 06/01/16  Darel Hong, MD  clindamycin (CLEOCIN) 150 MG capsule Take 2 capsules (300 mg total) by mouth 3 (three) times daily. 05/22/16 06/01/16  Darel Hong, MD    Allergies Sulfa antibiotics and Coumadin [warfarin]  Family  History  Problem Relation Age of Onset  . Heart disease Father     Social History Social History  Substance Use Topics  . Smoking status: Current Every Day Smoker    Packs/day: 1.00    Years: 50.00  . Smokeless tobacco: Never Used  . Alcohol use No    Review of Systems Constitutional: No fever/chills Eyes: No visual changes. ENT: No sore throat. Cardiovascular: Denies chest pain. Respiratory: Denies shortness of breath. Gastrointestinal: No abdominal pain.  No nausea, no vomiting.  No diarrhea.  No constipation. Genitourinary: Negative for dysuria. Musculoskeletal: Negative for back pain. Skin: Negative for rash. Neurological: Negative for  headaches, focal weakness or numbness.  10-point ROS otherwise negative.  ____________________________________________   PHYSICAL EXAM:  VITAL SIGNS: ED Triage Vitals  Enc Vitals Group     BP 05/22/16 1738 (!) 124/47     Pulse Rate 05/22/16 1738 (!) 55     Resp 05/22/16 1738 18     Temp 05/22/16 1738 97.8 F (36.6 C)     Temp Source 05/22/16 1738 Oral     SpO2 05/22/16 1738 96 %     Weight 05/22/16 1738 103 lb (46.7 kg)     Height 05/22/16 1738 4\' 10"  (1.473 m)     Head Circumference --      Peak Flow --      Pain Score 05/22/16 1742 9     Pain Loc --      Pain Edu? --      Excl. in [place]? --     Constitutional: Alert and oriented x 4 well appearing nontoxic no diaphoresis speaks in full, clear sentencesChronically ill-appearing Eyes: PERRL EOMI. Head: Atraumatic. Nose: No congestion/rhinnorhea. Mouth/Throat: No trismus Neck: No stridor.   Cardiovascular: Normal rate, regular rhythm. Grossly normal heart sounds.  Good peripheral circulation. Respiratory: Normal respiratory effort.  No retractions. Lungs CTAB and moving good air Gastrointestinal: Soft nondistended nontender no rebound no guarding no peritonitis no McBurney's tenderness negative Rovsing's no costovertebral tenderness negative Murphy's Musculoskeletal: Right ankle slightly swollen with erythema roughly 6 cm x 3 cm over the medial aspect of her ankle. She is able to range her ankle although with some pain. No crepitus bulla or blistering or sloughing  Neurologic:  Normal speech and language. No gross focal neurologic deficits are appreciated. Skin:  Skin is warm, dry and intact. No rash noted. Psychiatric: Moderate dementia.    ____________________________________________   DIFFERENTIAL  Cellulitis, deep vein thrombus, septic joint ____________________________________________   LABS (all labs ordered are listed, but only abnormal results are displayed)  Labs Reviewed  CBC WITH DIFFERENTIAL/PLATELET -  Abnormal; Notable for the following:       Result Value   WBC 11.9 (*)    Neutro Abs 8.3 (*)    Monocytes Absolute 1.4 (*)    All other components within normal limits  COMPREHENSIVE METABOLIC PANEL - Abnormal; Notable for the following:    Potassium 2.9 (*)    Chloride 89 (*)    CO2 38 (*)    Glucose, Bld 105 (*)    ALT 11 (*)    Total Bilirubin 1.3 (*)    All other components within normal limits  SEDIMENTATION RATE - Abnormal; Notable for the following:    Sed Rate 60 (*)    All other components within normal limits  URINALYSIS, COMPLETE (UACMP) WITH MICROSCOPIC - Abnormal; Notable for the following:    Color, Urine AMBER (*)    APPearance HAZY (*)  All other components within normal limits  C-REACTIVE PROTEIN - Abnormal; Notable for the following:    CRP 8.5 (*)    All other components within normal limits  BODY FLUID CULTURE  PROCALCITONIN  PROTIME-INR    Slightly elevated white count sedimentation rate and CRP which are quite nonspecific but could represent infection __________________________________________  EKG   ____________________________________________  RADIOLOGY  X-ray with no acute fracture but soft tissue swelling medially and ultrasound with no DVT but reactive lymph nodes ____________________________________________   PROCEDURES  Procedure(s) performed: yes  ARTHOCENTESIS Performed by: Darel Hong Consent: Verbal consent obtained. Risks and benefits: risks, benefits and alternatives were discussed Consent given by: patient Required items: required blood products, implants, devices, and special equipment available Patient identity confirmed: verbally with patient Time out: Immediately prior to procedure a "time out" was called to verify the correct patient, procedure, equipment, support staff and site/side marked as required. Indications: Evaluate for swollen joint  Joint: Right ankle Local anesthesia used: 1% lidocaine with epinephrine    Preparation: Patient was prepped and draped in the usual sterile fashion. Aspirate appearance: Blood-tinged  Aspirate amount: 0.5 ml Patient tolerance: Patient tolerated the procedure well with no immediate complications.     Procedures  Critical Care performed: no  ____________________________________________   INITIAL IMPRESSION / ASSESSMENT AND PLAN / ED COURSE  Pertinent labs & imaging results that were available during my care of the patient were reviewed by me and considered in my medical decision making (see chart for details).  The patient arrives hemodynamically stable and well appearing although with a slightly swollen right ankle. Concern is primarily for septic joint versus cellulitis. Septic joint is less likely as she is able to range her ankle without difficulty and she has had no dental cleaning recently and no other concerns for bacteremia. Regardless to get a definitive diagnosis I consented the patient and family and performed a right ankle arthrocentesis from an anterior medial approach expressing roughly 0.5 mL of blood-tinged fluid. It is not enough to send for cell counts I will send it for culture. At this point the likelihood of septic joint is lower than cellulitis so we'll treat her with oral antibiotics Keflex and clindamycin (she is allergic to Bactrim) and give her strict 2 day follow-up. She understands that if she cannot follow-up with her primary care physician on Monday she is to follow up in our emergency department for reevaluation. At this point the patient is improved and stable for discharge home with her family.      ____________________________________________   FINAL CLINICAL IMPRESSION(S) / ED DIAGNOSES  Final diagnoses:  Cellulitis, unspecified cellulitis site      NEW MEDICATIONS STARTED DURING THIS VISIT:  Discharge Medication List as of 05/22/2016 10:14 PM    START taking these medications   Details  cephALEXin (KEFLEX) 500 MG  capsule Take 1 capsule (500 mg total) by mouth 4 (four) times daily., Starting Sat 05/22/2016, Until Tue 06/01/2016, Print    clindamycin (CLEOCIN) 150 MG capsule Take 2 capsules (300 mg total) by mouth 3 (three) times daily., Starting Sat 05/22/2016, Until Tue 06/01/2016, Print         Note:  This document was prepared using Dragon voice recognition software and may include unintentional dictation errors.     Darel Hong, MD 05/23/16 [phone]

## 2016-05-23 LAB — C-REACTIVE PROTEIN: CRP: 8.5 mg/dL — AB (ref <1.0)

## 2016-05-24 ENCOUNTER — Encounter: Payer: Self-pay | Admitting: *Deleted

## 2016-05-24 ENCOUNTER — Other Ambulatory Visit: Payer: Self-pay

## 2016-05-24 ENCOUNTER — Observation Stay: Payer: Medicare HMO

## 2016-05-24 ENCOUNTER — Inpatient Hospital Stay
Admission: EM | Admit: 2016-05-24 | Discharge: 2016-05-26 | DRG: 603 | Disposition: A | Discharge status: Skilled Nursing Facility | Payer: Medicare HMO | Attending: Internal Medicine | Admitting: Internal Medicine

## 2016-05-24 DIAGNOSIS — I872 Venous insufficiency (chronic) (peripheral): Secondary | ICD-10-CM | POA: Diagnosis present

## 2016-05-24 DIAGNOSIS — R251 Tremor, unspecified: Secondary | ICD-10-CM | POA: Diagnosis present

## 2016-05-24 DIAGNOSIS — R001 Bradycardia, unspecified: Secondary | ICD-10-CM | POA: Diagnosis present

## 2016-05-24 DIAGNOSIS — I5032 Chronic diastolic (congestive) heart failure: Secondary | ICD-10-CM | POA: Diagnosis present

## 2016-05-24 DIAGNOSIS — L0390 Cellulitis, unspecified: Secondary | ICD-10-CM | POA: Diagnosis present

## 2016-05-24 DIAGNOSIS — Z66 Do not resuscitate: Secondary | ICD-10-CM | POA: Diagnosis present

## 2016-05-24 DIAGNOSIS — I110 Hypertensive heart disease with heart failure: Secondary | ICD-10-CM | POA: Diagnosis present

## 2016-05-24 DIAGNOSIS — J449 Chronic obstructive pulmonary disease, unspecified: Secondary | ICD-10-CM | POA: Diagnosis present

## 2016-05-24 DIAGNOSIS — I482 Chronic atrial fibrillation: Secondary | ICD-10-CM | POA: Diagnosis present

## 2016-05-24 DIAGNOSIS — Z882 Allergy status to sulfonamides status: Secondary | ICD-10-CM

## 2016-05-24 DIAGNOSIS — H9190 Unspecified hearing loss, unspecified ear: Secondary | ICD-10-CM | POA: Diagnosis present

## 2016-05-24 DIAGNOSIS — E876 Hypokalemia: Secondary | ICD-10-CM | POA: Diagnosis present

## 2016-05-24 DIAGNOSIS — F0390 Unspecified dementia without behavioral disturbance: Secondary | ICD-10-CM | POA: Diagnosis present

## 2016-05-24 DIAGNOSIS — E039 Hypothyroidism, unspecified: Secondary | ICD-10-CM | POA: Diagnosis present

## 2016-05-24 DIAGNOSIS — L03119 Cellulitis of unspecified part of limb: Secondary | ICD-10-CM

## 2016-05-24 DIAGNOSIS — Z888 Allergy status to other drugs, medicaments and biological substances status: Secondary | ICD-10-CM

## 2016-05-24 DIAGNOSIS — I081 Rheumatic disorders of both mitral and tricuspid valves: Secondary | ICD-10-CM | POA: Diagnosis present

## 2016-05-24 DIAGNOSIS — R112 Nausea with vomiting, unspecified: Secondary | ICD-10-CM | POA: Diagnosis not present

## 2016-05-24 DIAGNOSIS — L03115 Cellulitis of right lower limb: Principal | ICD-10-CM | POA: Diagnosis present

## 2016-05-24 DIAGNOSIS — Z8249 Family history of ischemic heart disease and other diseases of the circulatory system: Secondary | ICD-10-CM | POA: Diagnosis not present

## 2016-05-24 DIAGNOSIS — K219 Gastro-esophageal reflux disease without esophagitis: Secondary | ICD-10-CM | POA: Diagnosis present

## 2016-05-24 DIAGNOSIS — T40605A Adverse effect of unspecified narcotics, initial encounter: Secondary | ICD-10-CM | POA: Diagnosis not present

## 2016-05-24 DIAGNOSIS — T502X5A Adverse effect of carbonic-anhydrase inhibitors, benzothiadiazides and other diuretics, initial encounter: Secondary | ICD-10-CM | POA: Diagnosis present

## 2016-05-24 DIAGNOSIS — M1990 Unspecified osteoarthritis, unspecified site: Secondary | ICD-10-CM | POA: Diagnosis present

## 2016-05-24 DIAGNOSIS — M79604 Pain in right leg: Secondary | ICD-10-CM | POA: Diagnosis present

## 2016-05-24 DIAGNOSIS — Z7982 Long term (current) use of aspirin: Secondary | ICD-10-CM

## 2016-05-24 DIAGNOSIS — Z79899 Other long term (current) drug therapy: Secondary | ICD-10-CM

## 2016-05-24 DIAGNOSIS — R609 Edema, unspecified: Secondary | ICD-10-CM

## 2016-05-24 DIAGNOSIS — Z90710 Acquired absence of both cervix and uterus: Secondary | ICD-10-CM | POA: Diagnosis not present

## 2016-05-24 DIAGNOSIS — F17210 Nicotine dependence, cigarettes, uncomplicated: Secondary | ICD-10-CM | POA: Diagnosis present

## 2016-05-24 LAB — COMPREHENSIVE METABOLIC PANEL
ALK PHOS: 77 U/L (ref 38–126)
ALT: 14 U/L (ref 14–54)
AST: 26 U/L (ref 15–41)
Albumin: 3.4 g/dL — ABNORMAL LOW (ref 3.5–5.0)
Anion gap: 8 (ref 5–15)
BUN: 18 mg/dL (ref 6–20)
CO2: 36 mmol/L — ABNORMAL HIGH (ref 22–32)
CREATININE: 0.5 mg/dL (ref 0.44–1.00)
Calcium: 9 mg/dL (ref 8.9–10.3)
Chloride: 88 mmol/L — ABNORMAL LOW (ref 101–111)
GFR calc Af Amer: >60 mL/min (ref >60)
Glucose, Bld: 189 mg/dL — ABNORMAL HIGH (ref 65–99)
Potassium: 2.7 mmol/L — CL (ref 3.5–5.1)
Sodium: 132 mmol/L — ABNORMAL LOW (ref 135–145)
TOTAL PROTEIN: 7.3 g/dL (ref 6.5–8.1)
Total Bilirubin: 1.8 mg/dL — ABNORMAL HIGH (ref 0.3–1.2)

## 2016-05-24 LAB — CBC
HCT: 35.8 % (ref 35.0–47.0)
Hemoglobin: 12.4 g/dL (ref 12.0–16.0)
MCH: 30.9 pg (ref 26.0–34.0)
MCHC: 34.7 g/dL (ref 32.0–36.0)
MCV: 89 fL (ref 80.0–100.0)
Platelets: 203 10*3/uL (ref 150–440)
RBC: 4.02 MIL/uL (ref 3.80–5.20)
RDW: 13 % (ref 11.5–14.5)
WBC: 9.9 10*3/uL (ref 3.6–11.0)

## 2016-05-24 LAB — POTASSIUM: Potassium: 3.3 mmol/L — ABNORMAL LOW (ref 3.5–5.1)

## 2016-05-24 LAB — URIC ACID: URIC ACID, SERUM: 5.1 mg/dL (ref 2.3–6.6)

## 2016-05-24 MED ORDER — SODIUM CHLORIDE 0.9 % IV SOLN
INTRAVENOUS | Status: DC
  Administered 2016-05-24 – 2016-05-25 (×2): via INTRAVENOUS

## 2016-05-24 MED ORDER — ONDANSETRON HCL 4 MG/2ML IJ SOLN: 4.0000 mg | Freq: Four times a day (QID) | INTRAMUSCULAR | Status: DC | PRN

## 2016-05-24 MED ORDER — HEPARIN SODIUM (PORCINE) 5000 UNIT/ML IJ SOLN
5000.0000 [IU] | Freq: Three times a day (TID) | INTRAMUSCULAR | Status: DC
  Administered 2016-05-24 – 2016-05-26 (×6): 5000 [IU] via SUBCUTANEOUS
  Filled 2016-05-24 (×6): qty 1

## 2016-05-24 MED ORDER — ONDANSETRON HCL 4 MG PO TABS: 4.0000 mg | ORAL_TABLET | Freq: Four times a day (QID) | ORAL | Status: DC | PRN

## 2016-05-24 MED ORDER — VANCOMYCIN HCL IN DEXTROSE 750-5 MG/150ML-% IV SOLN: 750.0000 mg | INTRAVENOUS | Status: DC

## 2016-05-24 MED ORDER — METOCLOPRAMIDE HCL 5 MG/ML IJ SOLN
INTRAMUSCULAR | Status: AC
  Administered 2016-05-24: 5 mg via INTRAVENOUS
  Filled 2016-05-24: qty 2

## 2016-05-24 MED ORDER — CALCIUM CARBONATE-VITAMIN D 500-200 MG-UNIT PO TABS
1.0000 | ORAL_TABLET | Freq: Every day | ORAL | Status: DC
  Administered 2016-05-25 – 2016-05-26 (×2): 1 via ORAL
  Filled 2016-05-24 (×2): qty 1

## 2016-05-24 MED ORDER — SODIUM CHLORIDE 0.9 % IV SOLN
30.0000 meq | Freq: Once | INTRAVENOUS | Status: AC
  Administered 2016-05-24: 30 meq via INTRAVENOUS
  Filled 2016-05-24: qty 15

## 2016-05-24 MED ORDER — POTASSIUM CHLORIDE CRYS ER 20 MEQ PO TBCR
20.0000 meq | EXTENDED_RELEASE_TABLET | Freq: Every day | ORAL | Status: DC
  Administered 2016-05-25 – 2016-05-26 (×2): 20 meq via ORAL
  Filled 2016-05-24 (×2): qty 1

## 2016-05-24 MED ORDER — ASPIRIN EC 81 MG PO TBEC
81.0000 mg | DELAYED_RELEASE_TABLET | Freq: Every day | ORAL | Status: DC
  Administered 2016-05-25 – 2016-05-26 (×2): 81 mg via ORAL
  Filled 2016-05-24 (×2): qty 1

## 2016-05-24 MED ORDER — DONEPEZIL HCL 5 MG PO TABS
10.0000 mg | ORAL_TABLET | Freq: Every day | ORAL | Status: DC
  Administered 2016-05-24 – 2016-05-25 (×2): 10 mg via ORAL
  Filled 2016-05-24 (×2): qty 2

## 2016-05-24 MED ORDER — DONEPEZIL HCL 5 MG PO TABS: 10.0000 mg | ORAL_TABLET | Freq: Two times a day (BID) | ORAL | Status: DC

## 2016-05-24 MED ORDER — ONDANSETRON HCL 4 MG/2ML IJ SOLN
4.0000 mg | Freq: Once | INTRAMUSCULAR | Status: AC
  Administered 2016-05-24: 4 mg via INTRAVENOUS

## 2016-05-24 MED ORDER — TRAZODONE HCL 50 MG PO TABS
25.0000 mg | ORAL_TABLET | Freq: Every evening | ORAL | Status: DC | PRN
  Administered 2016-05-24: 25 mg via ORAL
  Filled 2016-05-24: qty 1

## 2016-05-24 MED ORDER — DOCUSATE SODIUM 100 MG PO CAPS
100.0000 mg | ORAL_CAPSULE | Freq: Two times a day (BID) | ORAL | Status: DC
  Administered 2016-05-24 – 2016-05-26 (×4): 100 mg via ORAL
  Filled 2016-05-24 (×5): qty 1

## 2016-05-24 MED ORDER — ACETAMINOPHEN 325 MG PO TABS
650.0000 mg | ORAL_TABLET | Freq: Four times a day (QID) | ORAL | Status: DC | PRN
  Administered 2016-05-25: 650 mg via ORAL
  Filled 2016-05-24: qty 2

## 2016-05-24 MED ORDER — BISACODYL 5 MG PO TBEC: 5.0000 mg | DELAYED_RELEASE_TABLET | Freq: Every day | ORAL | Status: DC | PRN

## 2016-05-24 MED ORDER — METOCLOPRAMIDE HCL 5 MG/ML IJ SOLN
5.0000 mg | Freq: Once | INTRAMUSCULAR | Status: AC
  Administered 2016-05-24: 5 mg via INTRAVENOUS

## 2016-05-24 MED ORDER — ACETAMINOPHEN 650 MG RE SUPP: 650.0000 mg | Freq: Four times a day (QID) | RECTAL | Status: DC | PRN

## 2016-05-24 MED ORDER — POTASSIUM CHLORIDE CRYS ER 20 MEQ PO TBCR
40.0000 meq | EXTENDED_RELEASE_TABLET | Freq: Once | ORAL | Status: AC
  Administered 2016-05-24: 40 meq via ORAL
  Filled 2016-05-24: qty 2

## 2016-05-24 MED ORDER — OXYCODONE-ACETAMINOPHEN 5-325 MG PO TABS
2.0000 | ORAL_TABLET | Freq: Once | ORAL | Status: AC
  Administered 2016-05-24: 2 via ORAL
  Filled 2016-05-24: qty 2

## 2016-05-24 MED ORDER — VANCOMYCIN HCL 500 MG IV SOLR
500.0000 mg | INTRAVENOUS | Status: DC
  Administered 2016-05-25 – 2016-05-26 (×2): 500 mg via INTRAVENOUS
  Filled 2016-05-24 (×3): qty 500

## 2016-05-24 MED ORDER — ONDANSETRON HCL 4 MG/2ML IJ SOLN
INTRAMUSCULAR | Status: AC
  Administered 2016-05-24: 4 mg via INTRAVENOUS
  Filled 2016-05-24: qty 2

## 2016-05-24 MED ORDER — VANCOMYCIN HCL IN DEXTROSE 1-5 GM/200ML-% IV SOLN
1000.0000 mg | Freq: Once | INTRAVENOUS | Status: AC
  Administered 2016-05-24: 1000 mg via INTRAVENOUS
  Filled 2016-05-24: qty 200

## 2016-05-24 MED ORDER — TEMAZEPAM 7.5 MG PO CAPS
7.5000 mg | ORAL_CAPSULE | Freq: Every evening | ORAL | Status: DC | PRN
  Administered 2016-05-25 (×2): 7.5 mg via ORAL
  Filled 2016-05-24 (×3): qty 1

## 2016-05-24 NOTE — Progress Notes (Signed)
ANTIBIOTIC CONSULT NOTE - INITIAL  Pharmacy Consult for Vancomycin Indication: Cellulitis of right ankle  Allergies  Allergen Reactions  . Sulfa Antibiotics Hives  . Coumadin [Warfarin] Rash    Patient Measurements: Height: 4\' 11"  (149.9 cm) Weight: 103 lb (46.7 kg) IBW/kg (Calculated) : 43.2 Adjusted Body Weight:   Vital Signs: Temp: 98.8 F (37.1 C) (04/09 0758) Temp Source: Oral (04/09 0758) BP: 107/51 (04/09 1300) Pulse Rate: 49 (04/09 1315) Intake/Output from previous day: No intake/output data recorded. Intake/Output from this shift: No intake/output data recorded.  Labs:  Recent Labs  05/22/16 1805 05/24/16 0800  WBC 11.9* 9.9  HGB 13.2 12.4  PLT 230 203  CREATININE 0.67 0.50   Estimated Creatinine Clearance: 36.3 mL/min (by C-G formula based on SCr of 0.5 mg/dL). No results for input(s): VANCOTROUGH, VANCOPEAK, VANCORANDOM, GENTTROUGH, GENTPEAK, GENTRANDOM, TOBRATROUGH, TOBRAPEAK, TOBRARND, AMIKACINPEAK, AMIKACINTROU, AMIKACIN in the last 72 hours.   Microbiology: Recent Results (from the past 720 hour(s))  Body fluid culture     Status: None (Preliminary result)   Collection Time: 05/22/16  8:10 PM  Result Value Ref Range Status   Specimen Description FLUID RIGHT ANKLE  Final   Special Requests NONE  Final   Gram Stain   Final    FEW WBC PRESENT,BOTH PMN AND MONONUCLEAR NO ORGANISMS SEEN    Culture   Final    NO GROWTH 1 DAY Performed at [facility], [address] [place], [place] [address]    Report Status PENDING  Incomplete    Medical History: Past Medical History:  Diagnosis Date  . Anemia   . Arthritis   . Atrial fibrillation ([place])   . Circulation problem   . COPD (chronic obstructive pulmonary disease) ([place])   . Dementia   . Dysrhythmia    AFIB  . GERD (gastroesophageal reflux disease)   . HOH (hard of hearing)   . Hypertension   . Hypothyroidism   . Lymphedema   . Swelling of limb   . Tremors of nervous system   .  Venous insufficiency   . Wheezing     Medications:   (Not in a hospital admission) Scheduled:  . aspirin EC  81 mg Oral Daily  . Calcium Carbonate-Vitamin D  1 tablet Oral Daily  . docusate sodium  100 mg Oral BID  . donepezil  10 mg Oral QHS  . heparin  5,000 Units Subcutaneous Q8H  . potassium chloride (KCL MULTIRUN) 30 mEq in 265 mL IVPB  30 mEq Intravenous Once  . potassium chloride SA  20 mEq Oral Daily   Assessment: Pharmacy consulted to dose and monitor Vancomycin in this 83 year old female being treated for right ankle cellulitis   PK:  ke= 0.0359; VD: 32.7 L; t1/2: 19 hours   Goal of Therapy:  Vancomycin trough level 10-15 mcg/ml  Plan:  Patient received 1 g of Vancomycin in ED. Will start Vancomycin 500 mg IV q24 hours on 4/10 @ 1100. Trough level ordered for 1030 on 4/13 (5 half lives) .   Pharmacy to follow and adjust dosing per protocol  Ilai Hiller D 05/24/2016,1:58 PM

## 2016-05-24 NOTE — ED Notes (Signed)
Pt vomiting again,  MD Vianne Bulls paged at this time

## 2016-05-24 NOTE — H&P (Signed)
[facility] NAME: Patricia Foley    MR#:  [id number]  DATE OF BIRTH:  07-02-1931  DATE OF ADMISSION:  05/24/2016  PRIMARY CARE PHYSICIAN: Glendon Axe, MD   REQUESTING/REFERRING PHYSICIAN: Dr .Jimmye Norman  CHIEF COMPLAINT:Right ankle pain, unable to ambulate with cellulitis.   Chief Complaint  Patient presents with  . Cellulitis    HISTORY OF PRESENT ILLNESS:  Patricia Foley  is a 83 y.o. female with a known history of Dementia, chronic atrial fibrillation was seen to 2 days ago in the emergency room for right ankle cellulitis that time patient had x-ray of the right ankle, ultrasound of the right leg, both of them did not show any evidence of fracture and DVT, patient had the right ankle diagnostic arthrocentesis and sent home with the  Clindamycin,, Keflex. On 05/22/16/. Patient took antibiotics on Sunday, 1 dose today morning but family brought her because of worsening pain, unable to ambulate, no improvement in her cellulitis. Patient also was given pain medicine the emergency room which made her nauseous and she started throwing up now. Found to have bradycardia with heart rate 48 bpm. Patient has completed dementia, spoke with patient's grandkids who take care of her mainly at home. At home patient does not use any walker or cane and walks like normally without any support. Will he complains of right ankle swelling which has been persistent and she is not able to bear any weight. No fever or chills at home. PAST MEDICAL HISTORY:   Past Medical History:  Diagnosis Date  . Anemia   . Arthritis   . Atrial fibrillation ([place])   . Circulation problem   . COPD (chronic obstructive pulmonary disease) ([place])   . Dementia   . Dysrhythmia    AFIB  . GERD (gastroesophageal reflux disease)   . HOH (hard of hearing)   . Hypertension   . Hypothyroidism   . Lymphedema   . Swelling of limb   . Tremors of nervous system   . Venous  insufficiency   . Wheezing     PAST SURGICAL HISTOIRY:   Past Surgical History:  Procedure Laterality Date  . ABDOMINAL HYSTERECTOMY    . CAROTID STENT    . CATARACT EXTRACTION W/PHACO Right 05/04/2016   Procedure: CATARACT EXTRACTION PHACO AND INTRAOCULAR LENS PLACEMENT (IOC);  Surgeon: Birder Robson, MD;  Location: [facility] ORS;  Service: Ophthalmology;  Laterality: Right;  [place] 3:02.7 AP% 23.1 CDE 42.15 Fluid pack lot # [id number]    SOCIAL HISTORY:   Social History  Substance Use Topics  . Smoking status: Current Every Day Smoker    Packs/day: 1.00    Years: 50.00  . Smokeless tobacco: Never Used  . Alcohol use No    FAMILY HISTORY:   Family History  Problem Relation Age of Onset  . Heart disease Father     DRUG ALLERGIES:   Allergies  Allergen Reactions  . Sulfa Antibiotics Hives  . Coumadin [Warfarin] Rash    REVIEW OF SYSTEMS:  Unable to obtain review of systems because of dementia.  MEDICATIONS AT HOME:   Prior to Admission medications   Medication Sig Start Date End Date Taking? Authorizing Provider  aspirin EC 81 MG tablet Take 81 mg by mouth daily.   Yes Historical Provider, MD  Calcium Carbonate-Vitamin D (CALCIUM 600+D) 600-200 MG-UNIT TABS Take 1 tablet by mouth daily.   Yes Historical Provider, MD  cephALEXin (KEFLEX) 500 MG capsule Take 1 capsule (  500 mg total) by mouth 4 (four) times daily. 05/22/16 06/01/16 Yes Darel Hong, MD  clindamycin (CLEOCIN) 150 MG capsule Take 2 capsules (300 mg total) by mouth 3 (three) times daily. 05/22/16 06/01/16 Yes Darel Hong, MD  digoxin (LANOXIN) 0.125 MG tablet Take 0.125 mg by mouth daily.    Yes Historical Provider, MD  diltiazem (CARDIZEM CD) 300 MG 24 hr capsule Take 300 mg by mouth daily.    Yes Historical Provider, MD  donepezil (ARICEPT) 10 MG tablet Take 10 mg by mouth 2 (two) times daily.    Yes Historical Provider, MD  furosemide (LASIX) 40 MG tablet Take 40 mg by mouth daily.    Yes Historical  Provider, MD  potassium chloride SA (K-DUR,KLOR-CON) 20 MEQ tablet Take 20 mEq by mouth daily.    Yes Historical Provider, MD  propranolol (INDERAL) 40 MG tablet Take 40 mg by mouth 2 (two) times daily.   Yes Historical Provider, MD  temazepam (RESTORIL) 7.5 MG capsule Take 7.5 mg by mouth at bedtime as needed for sleep.   Yes Historical Provider, MD      VITAL SIGNS:  Blood pressure (!) 142/68, pulse (!) 48, temperature 98.8 F (37.1 C), temperature source Oral, resp. rate 17, height 4\' 11"  (1.499 m), weight 46.7 kg (103 lb), SpO2 96 %.  PHYSICAL EXAMINATION:  GENERAL:  83 y.o.-year-old patient lying in the bed with no acute distress.  EYES: Pupils equal, round, reactive to light o scleral icterus.  HEENT: Head atraumatic, normocephalic. Oropharynx and nasopharynx clear.  NECK:  Supple, no jugular venous distention. No thyroid enlargement, no tenderness.  LUNGS: Normal breath sounds bilaterally, no wheezing, rales,rhonchi or crepitation. No use of accessory muscles of respiration.  CARDIOVASCULAR: S1, S2 normal. No murmurs, rubs, or gallops.  ABDOMEN: Soft, nontender, nondistended. Bowel sounds present. No organomegaly or mass.  EXTREMITIES: swelling  of right ankle with redness, tenderness to palpation,  NEUROLOGIC: Cranial nerves II through XII are intact. Muscle strength 5/5 in all extremities. Sensation intact. Gait not checked.  PSYCHIATRIC: The patient is alert and oriented x 3.  SKIN: No obvious rash, lesion, or ulcer.   LABORATORY PANEL:   CBC  Recent Labs Lab 05/24/16 0800  WBC 9.9  HGB 12.4  HCT 35.8  PLT 203   ------------------------------------------------------------------------------------------------------------------  Chemistries   Recent Labs Lab 05/24/16 0800  NA 132*  K 2.7*  CL 88*  CO2 36*  GLUCOSE 189*  BUN 18  CREATININE 0.50  CALCIUM 9.0  AST 26  ALT 14  ALKPHOS 77  BILITOT 1.8*    ------------------------------------------------------------------------------------------------------------------  Cardiac Enzymes No results for input(s): TROPONINI in the last 168 hours. ------------------------------------------------------------------------------------------------------------------  RADIOLOGY:  Dg Ankle Complete Right  Result Date: 05/22/2016 CLINICAL DATA:  Right foot/ankle pain x2 weeks, no known injury EXAM: RIGHT ANKLE - COMPLETE 3+ VIEW COMPARISON:  None. FINDINGS: No fracture or dislocation is seen. The ankle mortise is intact. The base of the fifth metatarsal is unremarkable. Mild diffuse soft tissue swelling. IMPRESSION: No fracture or dislocation is seen. Mild diffuse soft tissue swelling. Electronically Signed   By: Julian Hy M.D.   On: 05/22/2016 18:42   US Venous Img Lower Unilateral Right  Result Date: 05/22/2016 CLINICAL DATA:  Subacute onset of right ankle pain and swelling for 2 weeks. Initial encounter. EXAM: RIGHT LOWER EXTREMITY VENOUS DOPPLER ULTRASOUND TECHNIQUE: Gray-scale sonography with graded compression, as well as color Doppler and duplex ultrasound were performed to evaluate the lower extremity deep venous systems from  the level of the common femoral vein and including the common femoral, femoral, profunda femoral, popliteal and calf veins including the posterior tibial, peroneal and gastrocnemius veins when visible. The superficial great saphenous vein was also interrogated. Spectral Doppler was utilized to evaluate flow at rest and with distal augmentation maneuvers in the common femoral, femoral and popliteal veins. COMPARISON:  Right ankle radiographs performed earlier today at 6:03 p.m. FINDINGS: Contralateral Common Femoral Vein: Respiratory phasicity is normal and symmetric with the symptomatic side. No evidence of thrombus. Normal compressibility. Common Femoral Vein: No evidence of thrombus. Normal compressibility, respiratory  phasicity and response to augmentation. Saphenofemoral Junction: No evidence of thrombus. Normal compressibility and flow on color Doppler imaging. Profunda Femoral Vein: No evidence of thrombus. Normal compressibility and flow on color Doppler imaging. Femoral Vein: No evidence of thrombus. Normal compressibility, respiratory phasicity and response to augmentation. Popliteal Vein: No evidence of thrombus. Normal compressibility, respiratory phasicity and response to augmentation. Calf Veins: No evidence of thrombus. Normal compressibility and flow on color Doppler imaging. Superficial Great Saphenous Vein: No evidence of thrombus. Normal compressibility and flow on color Doppler imaging. Venous Reflux:  None. Other Findings: A prominent 1.8 cm node is noted at the right inguinal region. IMPRESSION: 1. No evidence of deep venous thrombosis. 2. Prominent 1.8 cm node at the right inguinal region. Electronically Signed   By: Garald Balding M.D.   On: 05/22/2016 20:11    EKG:   Orders placed or performed in visit on 07/20/14  . EKG 12-Lead  Bradycardia with 48 bpm on the monitor.  Official  EKG is pending.  IMPRESSION AND PLAN:   #61.83 year old the female patient with severe dementia with right ankle cellulitis not getting better with outpatient treatment with the persistent swelling, unable to bear weight. Admitted to hospitalist service started on by vancomycin, elevated the right leg.  #2 hypokalemia, replace the potassium, hypokalemia due to diuretics: Hold the Lasix at this time, replace the potassium, monitor on telemetry. #3, sinus bradycardia likely secondary to medication;hold beta blockers, hold propranolol .cardizem ,digoxin. 4.chronic afib;continue asa alone due to risk for falll with her dementia. 5, although was dementia: Continue Aricept, family unable to tell me about her CODE STATUS, once they get paperwork they can call me for updating the CODE STATUS.Marland Kitchen  All the records are reviewed and  case discussed with ED provider. Management plans discussed with the patient, family and they are in agreement.  CODE STATUS: full  TOTAL TIME TAKING CARE OF THIS PATIENT:55 minutes.    Epifanio Lesches M.D on 05/24/2016 at 12:28 PM  Between 7am to 6pm - Pager - [phone]  After 6pm go to [email or web address] - password [email or web address] [facility]  Office  [phone]  CC: Primary care physician; Glendon Axe, MD  Note: This dictation was prepared with Dragon dictation along with smaller phrase technology. Any transcriptional errors that result from this process are unintentional.

## 2016-05-24 NOTE — ED Notes (Signed)
Potassium 2.7 results called from lab.

## 2016-05-24 NOTE — ED Notes (Signed)
Pt transported to MRI with RN , pt will be on cardiac monitoring during scan

## 2016-05-24 NOTE — ED Provider Notes (Signed)
[facility] Emergency Department Provider Note       Time seen: ----------------------------------------- 9:17 AM on 05/24/2016 -----------------------------------------     I have reviewed the triage vital signs and the nursing notes.   HISTORY   Chief Complaint Cellulitis    HPI Patricia Foley is a 83 y.o. female who presents to the ED for right ankle pain and erythema. Patient was seen Saturday and given antibiotics for cellulitis. Patient and family states she's been taking antibiotics but is not feeling any better. Right ankle has been persistently swollen and she cannot bear weight on it. They deny fevers, chills or other complaints.   Past Medical History:  Diagnosis Date  . Anemia   . Arthritis   . Atrial fibrillation ([place])   . Circulation problem   . COPD (chronic obstructive pulmonary disease) ([place])   . Dementia   . Dysrhythmia    AFIB  . GERD (gastroesophageal reflux disease)   . HOH (hard of hearing)   . Hypertension   . Hypothyroidism   . Lymphedema   . Swelling of limb   . Tremors of nervous system   . Venous insufficiency   . Wheezing     Patient Active Problem List   Diagnosis Date Noted  . Chronic venous insufficiency 10/04/2015  . Lymphedema 10/04/2015  . Pain in limb 10/04/2015    Past Surgical History:  Procedure Laterality Date  . ABDOMINAL HYSTERECTOMY    . CAROTID STENT    . CATARACT EXTRACTION W/PHACO Right 05/04/2016   Procedure: CATARACT EXTRACTION PHACO AND INTRAOCULAR LENS PLACEMENT (IOC);  Surgeon: Birder Robson, MD;  Location: [facility] ORS;  Service: Ophthalmology;  Laterality: Right;  [place] 3:02.7 AP% 23.1 CDE 42.15 Fluid pack lot # [id number]    Allergies Sulfa antibiotics and Coumadin [warfarin]  Social History Social History  Substance Use Topics  . Smoking status: Current Every Day Smoker    Packs/day: 1.00    Years: 50.00  . Smokeless tobacco: Never Used  . Alcohol use No    Review of  Systems Constitutional: Negative for fever. Cardiovascular: Negative for chest pain. Respiratory: Negative for shortness of breath. Gastrointestinal: Negative for abdominal pain, vomiting and diarrhea. Musculoskeletal: Positive for right ankle pain Skin: Positive for right ankle erythema Neurological: Negative for headaches, focal weakness or numbness.  10-point ROS otherwise negative.  ____________________________________________   PHYSICAL EXAM:  VITAL SIGNS: ED Triage Vitals  Enc Vitals Group     BP 05/24/16 0758 (!) 118/50     Pulse Rate 05/24/16 0758 61     Resp 05/24/16 0758 18     Temp 05/24/16 0758 98.8 F (37.1 C)     Temp Source 05/24/16 0758 Oral     SpO2 05/24/16 0758 100 %     Weight 05/24/16 0756 103 lb (46.7 kg)     Height 05/24/16 0756 4\' 11"  (1.499 m)     Head Circumference --      Peak Flow --      Pain Score 05/24/16 0755 9     Pain Loc --      Pain Edu? --      Excl. in [place]? --     Constitutional: Alert and oriented. Well appearing and in no distress. Eyes: Conjunctivae are normal. PERRL. Normal extraocular movements. ENT   Head: Normocephalic and atraumatic.   Nose: No congestion/rhinnorhea.   Mouth/Throat: Mucous membranes are moist.   Neck: No stridor. Cardiovascular: Normal rate, regular rhythm. No murmurs, rubs, or  gallops. Respiratory: Normal respiratory effort without tachypnea nor retractions. Breath sounds are clear and equal bilaterally. No wheezes/rales/rhonchi. Gastrointestinal: Soft and nontender. Normal bowel sounds Musculoskeletal: Tenderness and swelling is noted around the right ankle, particularly medially. There is pain with range of motion of the right ankle. Neurologic:  Normal speech and language. No gross focal neurologic deficits are appreciated.  Skin:  Right ankle erythema extends from the medial malleolus to the forefoot. Psychiatric: Mood and affect are normal. Speech and behavior are normal.   ____________________________________________  ED COURSE:  Pertinent labs & imaging results that were available during my care of the patient were reviewed by me and considered in my medical decision making (see chart for details). Patient presents for right ankle pain and swelling here for a recheck, we will assess with labs and imaging as indicated.   Procedures ____________________________________________   LABS (pertinent positives/negatives)  Labs Reviewed  COMPREHENSIVE METABOLIC PANEL - Abnormal; Notable for the following:       Result Value   Sodium 132 (*)    Potassium 2.7 (*)    Chloride 88 (*)    CO2 36 (*)    Glucose, Bld 189 (*)    Albumin 3.4 (*)    Total Bilirubin 1.8 (*)    All other components within normal limits  CBC  URIC ACID   ____________________________________________  FINAL ASSESSMENT AND PLAN  Ankle swelling, cellulitis  Plan: Patient's labs and imaging were dictated above. Patient had presented for persistent right ankle swelling and pain. She's been taking Keflex and clindamycin without any improvement. She has not had fevers or chills but cannot bear weight on the right ankle. Fluid culture has been negative to this point that was obtained 2 days ago. X-rays were -2 days ago. I will order an MRI, IV antibiotics and she will likely need observation.   Earleen Newport, MD   Note: This note was generated in part or whole with voice recognition software. Voice recognition is usually quite accurate but there are transcription errors that can and very often do occur. I apologize for any typographical errors that were not detected and corrected.     Earleen Newport, MD 05/24/16 1049

## 2016-05-24 NOTE — ED Triage Notes (Signed)
Pt arrives with complaints of continued right ankle and leg pain, states she was seen Saturday and given abx for cellulitis, states she has been taking abx but does not feel any better, right ankle swollen upon assessment

## 2016-05-24 NOTE — ED Notes (Signed)
Patient to Room 10.  Stephaine RN aware of arrival in ED and of potassium level of 2.7.

## 2016-05-25 LAB — BASIC METABOLIC PANEL
Anion gap: 7 (ref 5–15)
BUN: 15 mg/dL (ref 6–20)
CALCIUM: 8.8 mg/dL — AB (ref 8.9–10.3)
CO2: 32 mmol/L (ref 22–32)
Chloride: 96 mmol/L — ABNORMAL LOW (ref 101–111)
Creatinine, Ser: 0.57 mg/dL (ref 0.44–1.00)
GFR calc Af Amer: >60 mL/min (ref >60)
GLUCOSE: 102 mg/dL — AB (ref 65–99)
Potassium: 3.3 mmol/L — ABNORMAL LOW (ref 3.5–5.1)
SODIUM: 135 mmol/L (ref 135–145)

## 2016-05-25 LAB — CBC
HCT: 33.8 % — ABNORMAL LOW (ref 35.0–47.0)
Hemoglobin: 11.7 g/dL — ABNORMAL LOW (ref 12.0–16.0)
MCH: 30.4 pg (ref 26.0–34.0)
MCHC: 34.6 g/dL (ref 32.0–36.0)
MCV: 88.1 fL (ref 80.0–100.0)
Platelets: 205 10*3/uL (ref 150–440)
RBC: 3.83 MIL/uL (ref 3.80–5.20)
RDW: 13 % (ref 11.5–14.5)
WBC: 8.3 10*3/uL (ref 3.6–11.0)

## 2016-05-25 LAB — MAGNESIUM: MAGNESIUM: 1.7 mg/dL (ref 1.7–2.4)

## 2016-05-25 LAB — GLUCOSE, CAPILLARY: Glucose-Capillary: 131 mg/dL — ABNORMAL HIGH (ref 65–99)

## 2016-05-25 MED ORDER — DIAZEPAM 2 MG PO TABS: 2.0000 mg | ORAL_TABLET | Freq: Four times a day (QID) | ORAL | Status: DC | PRN

## 2016-05-25 MED ORDER — POTASSIUM CHLORIDE CRYS ER 20 MEQ PO TBCR
20.0000 meq | EXTENDED_RELEASE_TABLET | Freq: Once | ORAL | Status: AC
  Administered 2016-05-25: 20 meq via ORAL
  Filled 2016-05-25: qty 1

## 2016-05-25 NOTE — Care Management (Signed)
Normally able to ambulate independently. Now, unable to ambulate due to swelling/cellulitis right ankle that failed outpatient treatment.  Have requested PT and OT consults.

## 2016-05-25 NOTE — Progress Notes (Signed)
Family staying at bedside requesting all four bedrails up.

## 2016-05-25 NOTE — Progress Notes (Signed)
MEDICATION RELATED CONSULT NOTE - INITIAL   Pharmacy Consult for Electrolyte Management Indication: hypokalemia  Allergies  Allergen Reactions  . Sulfa Antibiotics Hives  . Coumadin [Warfarin] Rash   Labs: BMP Latest Ref Rng & Units 05/25/2016 05/24/2016 05/24/2016  Glucose 65 - 99 mg/dL 102(H) - 189(H)  BUN 6 - 20 mg/dL 15 - 18  Creatinine 0.44 - 1.00 mg/dL 0.57 - 0.50  Sodium 135 - 145 mmol/L 135 - 132(L)  Potassium 3.5 - 5.1 mmol/L 3.3(L) 3.3(L) 2.7(LL)  Chloride 101 - 111 mmol/L 96(L) - 88(L)  CO2 22 - 32 mmol/L 32 - 36(H)  Calcium 8.9 - 10.3 mg/dL 8.8(L) - 9.0   Medications:  Scheduled:  . aspirin EC  81 mg Oral Daily  . calcium-vitamin D  1 tablet Oral Daily  . docusate sodium  100 mg Oral BID  . donepezil  10 mg Oral QHS  . heparin  5,000 Units Subcutaneous Q8H  . potassium chloride SA  20 mEq Oral Daily  . vancomycin  500 mg Intravenous Q24H    Assessment: Patient with hypokalemia on admission. Received KCl 38mEq IV x 1 on 4/9. Currently ordered KCl 92mEq PO daily as was taking at home along with Lasix.  Goal of Therapy:  Maintain electrolytes WNL  Plan:  Patient with improvement in K level after IV bolus. Currently not ordered furosemide. Will continue with KCl 81mEq PO daily. Will give additional dose of KCl 73mEq PO x 1 today and follow up on AM labs.  Paulina Fusi, PharmD, BCPS 05/25/2016 1:32 PM

## 2016-05-25 NOTE — Progress Notes (Signed)
PT Cancellation Note  Patient Details Name: Patricia Foley MRN: [id number] DOB: 07/26/1931   Cancelled Treatment:    Reason Eval/Treat Not Completed: Medical issues which prohibited therapy; Per Dr. Benjie Karvonen hold PT eval pending results of Ortho consult to confirm no R ankle Fx.  Will evaluate pt at a future date/time as medically appropriate.   Linus Salmons PT, DPT 05/25/16, 2:08 PM

## 2016-05-25 NOTE — Progress Notes (Addendum)
[place] [facility] [place] NAME: Patricia Foley    MR#:  [id number]  DATE OF BIRTH:  11-Jul-1931  SUBJECTIVE:  Patient doing ok she has dementia but able to answer questions this am Has right ankle pain  REVIEW OF SYSTEMS:    Review of Systems  Constitutional: Negative.  Negative for chills, fever and malaise/fatigue.  HENT: Negative.  Negative for ear discharge, ear pain, hearing loss, nosebleeds and sore throat.   Eyes: Negative.  Negative for blurred vision and pain.  Respiratory: Negative.  Negative for cough, hemoptysis, shortness of breath and wheezing.   Cardiovascular: Negative.  Negative for chest pain, palpitations and leg swelling.  Gastrointestinal: Negative.  Negative for abdominal pain, blood in stool, diarrhea, nausea and vomiting.  Genitourinary: Negative.  Negative for dysuria.  Musculoskeletal: Positive for joint pain. Negative for back pain.       Rught ankle swollen   Skin: Negative.   Neurological: Negative for dizziness, tremors, speech change, focal weakness, seizures and headaches.  Endo/Heme/Allergies: Negative.  Does not bruise/bleed easily.  Psychiatric/Behavioral: Positive for memory loss. Negative for depression, hallucinations and suicidal ideas.    Tolerating Diet:yes      DRUG ALLERGIES:   Allergies  Allergen Reactions  . Sulfa Antibiotics Hives  . Coumadin [Warfarin] Rash    VITALS:  Blood pressure (!) 102/48, pulse (!) 49, temperature 97.4 F (36.3 C), temperature source Oral, resp. rate 18, height 4\' 11"  (1.499 m), weight 50.3 kg (111 lb), SpO2 94 %.  PHYSICAL EXAMINATION:   Physical Exam  Constitutional: She is well-developed, well-nourished, and in no distress. No distress.  HENT:  Head: Normocephalic.  Eyes: No scleral icterus.  Neck: Normal range of motion. Neck supple. No JVD present. No tracheal deviation present.  Cardiovascular: Normal heart sounds.  Exam reveals no gallop and no  friction rub.   No murmur heard. irr, irr slow HR  Pulmonary/Chest: Effort normal and breath sounds normal. No respiratory distress. She has no wheezes. She has no rales. She exhibits no tenderness.  Abdominal: Soft. Bowel sounds are normal. She exhibits no distension and no mass. There is no tenderness. There is no rebound and no guarding.  Musculoskeletal: Normal range of motion. She exhibits no edema.  Neurological: She is alert.  Skin: Skin is warm. No rash noted. No erythema.  Right ankle erythema improved Swelling with decreased ROM  Psychiatric: Affect normal.      LABORATORY PANEL:   CBC  Recent Labs Lab 05/25/16 0519  WBC 8.3  HGB 11.7*  HCT 33.8*  PLT 205   ------------------------------------------------------------------------------------------------------------------  Chemistries   Recent Labs Lab 05/24/16 0800  05/25/16 0519  NA 132*  --  135  K 2.7*  < > 3.3*  CL 88*  --  96*  CO2 36*  --  32  GLUCOSE 189*  --  102*  BUN 18  --  15  CREATININE 0.50  --  0.57  CALCIUM 9.0  --  8.8*  AST 26  --   --   ALT 14  --   --   ALKPHOS 77  --   --   BILITOT 1.8*  --   --   < > = values in this interval not displayed. ------------------------------------------------------------------------------------------------------------------  Cardiac Enzymes No results for input(s): TROPONINI in the last 168 hours. ------------------------------------------------------------------------------------------------------------------  RADIOLOGY:  Mr Ankle Right Wo Contrast  Result Date: 05/24/2016 CLINICAL DATA:  The patient was seen 2 days ago in  the emergency room for right ankle pain, swelling and erythema. No known injury. EXAM: MRI OF THE RIGHT ANKLE WITHOUT CONTRAST TECHNIQUE: Multiplanar, multisequence MR imaging of the ankle was performed. No intravenous contrast was administered. COMPARISON:  Plain films right ankle 05/22/2016. FINDINGS: Patient motion on all  sequences degrades the exam. TENDONS Peroneal: Intact. Posteromedial: Intact. There is some fluid along the undersurface of the flexor hallucis longus tendon as it passes deep to the calcaneus and navicular. Anterior: Intact. Achilles: Intact. Plantar Fascia: Unremarkable. LIGAMENTS Lateral: Intact. Medial: Intact. CARTILAGE Ankle Joint: Moderate tibiotalar joint effusion is identified. There is joint space narrowing with some subchondral cyst formation and osteophytosis about the posterior aspect of the joint. Subtalar Joints/Sinus Tarsi: Fluid extending out of the posterior subtalar joint into the lateral soft tissues of the foot is identified. The fluid measures approximately 2.5 cm transverse by 1.7 cm craniocaudal in the subcutaneous position by 1.3 cm long. Bones: No marrow edema to suggest osteomyelitis. Prominent subchondral cysts are seen in the lateral malleolus and subjacent lateral process of the talus. Joint space narrowing and cyst formation are seen about the calcaneocuboid joint. No fracture. Other: Subcutaneous edema is present about the ankle. IMPRESSION: Negative for osteomyelitis. Subcutaneous edema about the ankle is compatible cellulitis or dependent change. Prominent tibiotalar joint effusion cannot be definitively characterized but there is no marrow edema about the joint as is typically seen in a septic joint. Fluid extending out of the posterior subtalar joint into the lateral subcutaneous soft tissues is compatible with a synovial or ganglion cyst. Small amount of fluid along the plantar surface of the flexor hallucis longus is compatible with tenosynovitis. Electronically Signed   By: Inge Rise M.D.   On: 05/24/2016 14:41     ASSESSMENT AND PLAN:   83 year old female with dementia who presents with right ankle pain and found to have cellulitis.  1. Right ankle pain with findings consistent with cellulitis and failed outpatient treatment with clindamycin and  doxycycline. Continue IV vancomycin Patient continues to have significant pain and swelling Recent MRI shows no evidence of fracture however we'll go ahead and have orthopedic evaluation May be due to arthritis  2. Hypokalemia: Potassium is being repleted and will recheck in a.m. Check MG  3. Dementia: Continue Aricept  4. Chronic atrial fibrillation with Bradycardia: Continue to hold digoxin and propanolol Continue aspirin Cardiology consult Dr Ubaldo Glassing   Management plans discussed with the patient and family and they are in agreement.  CODE STATUS: DNR  TOTAL TIME TAKING CARE OF THIS PATIENT: 37 minutes.     POSSIBLE D/C 1-2 days, DEPENDING ON CLINICAL CONDITION.   Gilbert Manolis M.D on 05/25/2016 at 12:00 PM  Between 7am to 6pm - Pager - [phone] After 6pm go to [email or web address] - password [email or web address] [facility]  Office  [phone]  CC: Primary care physician; Glendon Axe, MD  Note: This dictation was prepared with Dragon dictation along with smaller phrase technology. Any transcriptional errors that result from this process are unintentional.

## 2016-05-25 NOTE — Progress Notes (Signed)
Patient napping most of the day as she was given sleeping aids late last night/early morning. Easily arousable, alert to self, baseline dementia. Taking pills whole with water, poor appetite. Granddaughter at bedside all day.

## 2016-05-26 DIAGNOSIS — F0390 Unspecified dementia without behavioral disturbance: Secondary | ICD-10-CM | POA: Diagnosis not present

## 2016-05-26 DIAGNOSIS — F0280 Dementia in other diseases classified elsewhere without behavioral disturbance: Secondary | ICD-10-CM | POA: Diagnosis not present

## 2016-05-26 DIAGNOSIS — L03115 Cellulitis of right lower limb: Secondary | ICD-10-CM | POA: Diagnosis not present

## 2016-05-26 DIAGNOSIS — E876 Hypokalemia: Secondary | ICD-10-CM | POA: Diagnosis not present

## 2016-05-26 DIAGNOSIS — M19071 Primary osteoarthritis, right ankle and foot: Secondary | ICD-10-CM | POA: Diagnosis not present

## 2016-05-26 DIAGNOSIS — Z959 Presence of cardiac and vascular implant and graft, unspecified: Secondary | ICD-10-CM | POA: Diagnosis not present

## 2016-05-26 DIAGNOSIS — Z7401 Bed confinement status: Secondary | ICD-10-CM | POA: Diagnosis not present

## 2016-05-26 DIAGNOSIS — L0390 Cellulitis, unspecified: Secondary | ICD-10-CM | POA: Diagnosis not present

## 2016-05-26 DIAGNOSIS — I480 Paroxysmal atrial fibrillation: Secondary | ICD-10-CM | POA: Diagnosis not present

## 2016-05-26 DIAGNOSIS — J449 Chronic obstructive pulmonary disease, unspecified: Secondary | ICD-10-CM | POA: Diagnosis not present

## 2016-05-26 DIAGNOSIS — I10 Essential (primary) hypertension: Secondary | ICD-10-CM | POA: Diagnosis not present

## 2016-05-26 DIAGNOSIS — I482 Chronic atrial fibrillation: Secondary | ICD-10-CM | POA: Diagnosis not present

## 2016-05-26 DIAGNOSIS — R001 Bradycardia, unspecified: Secondary | ICD-10-CM | POA: Diagnosis not present

## 2016-05-26 DIAGNOSIS — I872 Venous insufficiency (chronic) (peripheral): Secondary | ICD-10-CM | POA: Diagnosis not present

## 2016-05-26 DIAGNOSIS — I4891 Unspecified atrial fibrillation: Secondary | ICD-10-CM | POA: Diagnosis not present

## 2016-05-26 DIAGNOSIS — G301 Alzheimer's disease with late onset: Secondary | ICD-10-CM | POA: Diagnosis not present

## 2016-05-26 LAB — BODY FLUID CULTURE: CULTURE: NO GROWTH

## 2016-05-26 LAB — BASIC METABOLIC PANEL
Anion gap: 4 — ABNORMAL LOW (ref 5–15)
BUN: 13 mg/dL (ref 6–20)
CALCIUM: 8.9 mg/dL (ref 8.9–10.3)
CO2: 33 mmol/L — ABNORMAL HIGH (ref 22–32)
CREATININE: 0.39 mg/dL — AB (ref 0.44–1.00)
Chloride: 102 mmol/L (ref 101–111)
GFR calc Af Amer: >60 mL/min (ref >60)
Glucose, Bld: 92 mg/dL (ref 65–99)
Potassium: 4.2 mmol/L (ref 3.5–5.1)
SODIUM: 139 mmol/L (ref 135–145)

## 2016-05-26 LAB — GLUCOSE, CAPILLARY: GLUCOSE-CAPILLARY: 96 mg/dL (ref 65–99)

## 2016-05-26 MED ORDER — NAPROXEN 500 MG PO TABS: 500.0000 mg | ORAL_TABLET | Freq: Two times a day (BID) | ORAL | 0 refills | Status: DC

## 2016-05-26 MED ORDER — TRAMADOL HCL 50 MG PO TABS: 50.0000 mg | ORAL_TABLET | Freq: Four times a day (QID) | ORAL | 0 refills | Status: DC | PRN

## 2016-05-26 MED ORDER — PROPRANOLOL HCL 20 MG PO TABS
40.0000 mg | ORAL_TABLET | Freq: Two times a day (BID) | ORAL | Status: DC
  Administered 2016-05-26: 40 mg via ORAL
  Filled 2016-05-26: qty 2

## 2016-05-26 MED ORDER — MELOXICAM 7.5 MG PO TABS: 7.5000 mg | ORAL_TABLET | Freq: Two times a day (BID) | ORAL | Status: DC

## 2016-05-26 MED ORDER — ACETAMINOPHEN 325 MG PO TABS: 650.0000 mg | ORAL_TABLET | Freq: Four times a day (QID) | ORAL | 0 refills | Status: DC | PRN

## 2016-05-26 NOTE — Discharge Summary (Signed)
[facility] [place] NAME: Patricia Foley    MR#:  [id number]  DATE OF BIRTH:  Jun 10, 1931  DATE OF ADMISSION:  05/24/2016 ADMITTING PHYSICIAN: Epifanio Lesches, MD  DATE OF DISCHARGE: 05/26/2016  PRIMARY CARE PHYSICIAN: Singh,Jasmine, MD    ADMISSION DIAGNOSIS:  Cellulitis of ankle [T02.111] Hypokalemia [E87.6] Swelling [R60.9] Cellulitis [L03.90]  DISCHARGE DIAGNOSIS:  Active Problems:   Cellulitis of right ankle   Cellulitis   SECONDARY DIAGNOSIS:   Past Medical History:  Diagnosis Date  . Anemia   . Arthritis   . Atrial fibrillation ([place])   . Circulation problem   . COPD (chronic obstructive pulmonary disease) ([place])   . Dementia   . Dysrhythmia    AFIB  . GERD (gastroesophageal reflux disease)   . HOH (hard of hearing)   . Hypertension   . Hypothyroidism   . Lymphedema   . Swelling of limb   . Tremors of nervous system   . Venous insufficiency   . Wheezing     HOSPITAL COURSE:   83 year old female with dementia who presents with right ankle pain and found to have cellulitis.  1. Right ankle pain with findings consistent with cellulitis and failed outpatient treatment with clindamycin and keflex. Her cellulitis is much improved. I feel patient may continue her outpatient dose of clindamycin and Keflex Recent MRI shows no evidence of fracture. Her x-ray shows severe arthritis. She was evaluated by orthopedic surgery as well. This is not felt to be a septic joint. This is due to severe arthritis. Recommendations were for NSAID or meloxicam at discharge.  2. Hypokalemia: Potassium was repleted.  3. Dementia: Continue Aricept  4. Chronic atrial fibrillation with Bradycardia: Her heart rates have improved. She will stop diltiazem and digoxin. She will continue propanolol. She was seen by cardiology service while in the hospital.   DISCHARGE CONDITIONS AND DIET:   Stable for discharge on regular  diet  CONSULTS OBTAINED:  Treatment Team:  Isaias Cowman, MD Teodoro Spray, MD [place], MD  DRUG ALLERGIES:   Allergies  Allergen Reactions  . Sulfa Antibiotics Hives  . Coumadin [Warfarin] Rash    DISCHARGE MEDICATIONS:   Current Discharge Medication List    START taking these medications   Details  acetaminophen (TYLENOL) 325 MG tablet Take 2 tablets (650 mg total) by mouth every 6 (six) hours as needed for mild pain (or Fever >/= 101). Qty: 30 tablet, Refills: 0    naproxen (NAPROSYN) 500 MG tablet Take 1 tablet (500 mg total) by mouth 2 (two) times daily with a meal. Ankle pain swelling Qty: 5 tablet, Refills: 0      CONTINUE these medications which have NOT CHANGED   Details  aspirin EC 81 MG tablet Take 81 mg by mouth daily.    Calcium Carbonate-Vitamin D (CALCIUM 600+D) 600-200 MG-UNIT TABS Take 1 tablet by mouth daily.    cephALEXin (KEFLEX) 500 MG capsule Take 1 capsule (500 mg total) by mouth 4 (four) times daily. Qty: 40 capsule, Refills: 0    clindamycin (CLEOCIN) 150 MG capsule Take 2 capsules (300 mg total) by mouth 3 (three) times daily. Qty: 60 capsule, Refills: 0    donepezil (ARICEPT) 10 MG tablet Take 10 mg by mouth 2 (two) times daily.     furosemide (LASIX) 40 MG tablet Take 40 mg by mouth daily.     potassium chloride SA (K-DUR,KLOR-CON) 20 MEQ tablet Take 20 mEq by mouth daily.  propranolol (INDERAL) 40 MG tablet Take 40 mg by mouth 2 (two) times daily.    temazepam (RESTORIL) 7.5 MG capsule Take 7.5 mg by mouth at bedtime as needed for sleep.      STOP taking these medications     digoxin (LANOXIN) 0.125 MG tablet      diltiazem (CARDIZEM CD) 300 MG 24 hr capsule           Today   CHIEF COMPLAINT:  Patient states that right ankle pain has improved   VITAL SIGNS:  Blood pressure (!) 141/59, pulse 63, temperature 98.9 F (37.2 C), temperature source Oral, resp. rate 18, height 4\' 11"  (1.499 m), weight 51.1  kg (112 lb 11.2 oz), SpO2 94 %.   REVIEW OF SYSTEMS:  Review of Systems  Constitutional: Negative.  Negative for chills, fever and malaise/fatigue.  HENT: Negative.  Negative for ear discharge, ear pain, hearing loss, nosebleeds and sore throat.   Eyes: Negative.  Negative for blurred vision and pain.  Respiratory: Negative.  Negative for cough, hemoptysis, shortness of breath and wheezing.   Cardiovascular: Negative.  Negative for chest pain, palpitations and leg swelling.  Gastrointestinal: Negative.  Negative for abdominal pain, blood in stool, diarrhea, nausea and vomiting.  Genitourinary: Negative.  Negative for dysuria.  Musculoskeletal: Positive for joint pain (better). Negative for back pain.  Skin: Negative.   Neurological: Negative for dizziness, tremors, speech change, focal weakness, seizures and headaches.  Endo/Heme/Allergies: Negative.  Does not bruise/bleed easily.  Psychiatric/Behavioral: Positive for memory loss. Negative for depression, hallucinations and suicidal ideas.     PHYSICAL EXAMINATION:  GENERAL:  83 y.o.-year-old patient lying in the bed with no acute distress.  NECK:  Supple, no jugular venous distention. No thyroid enlargement, no tenderness.  LUNGS: Normal breath sounds bilaterally, no wheezing, rales,rhonchi  No use of accessory muscles of respiration.  CARDIOVASCULAR: S1, S2 normal. No murmurs, rubs, or gallops.  ABDOMEN: Soft, non-tender, non-distended. Bowel sounds present. No organomegaly or mass.  EXTREMITIES: No pedal edema, cyanosis, or clubbing. Right ankle shows severe arthritic changes and edema. Range of motion has improved since yesterday PSYCHIATRIC: The patient is alert and oriented x 2 SKIN: No obvious rash, lesion, or ulcer. No erythema  DATA REVIEW:   CBC  Recent Labs Lab 05/25/16 0519  WBC 8.3  HGB 11.7*  HCT 33.8*  PLT 205    Chemistries   Recent Labs Lab 05/24/16 0800  05/25/16 1222 05/26/16 0510  NA 132*  < >   --  139  K 2.7*  < >  --  4.2  CL 88*  < >  --  102  CO2 36*  < >  --  33*  GLUCOSE 189*  < >  --  92  BUN 18  < >  --  13  CREATININE 0.50  < >  --  0.39*  CALCIUM 9.0  < >  --  8.9  MG  --   --  1.7  --   AST 26  --   --   --   ALT 14  --   --   --   ALKPHOS 77  --   --   --   BILITOT 1.8*  --   --   --   < > = values in this interval not displayed.  Cardiac Enzymes No results for input(s): TROPONINI in the last 168 hours.  Microbiology Results  @MICRORSLT48 @  RADIOLOGY:  Mr Ankle Right Wo Contrast  Result Date: 05/24/2016 CLINICAL DATA:  The patient was seen 2 days ago in the emergency room for right ankle pain, swelling and erythema. No known injury. EXAM: MRI OF THE RIGHT ANKLE WITHOUT CONTRAST TECHNIQUE: Multiplanar, multisequence MR imaging of the ankle was performed. No intravenous contrast was administered. COMPARISON:  Plain films right ankle 05/22/2016. FINDINGS: Patient motion on all sequences degrades the exam. TENDONS Peroneal: Intact. Posteromedial: Intact. There is some fluid along the undersurface of the flexor hallucis longus tendon as it passes deep to the calcaneus and navicular. Anterior: Intact. Achilles: Intact. Plantar Fascia: Unremarkable. LIGAMENTS Lateral: Intact. Medial: Intact. CARTILAGE Ankle Joint: Moderate tibiotalar joint effusion is identified. There is joint space narrowing with some subchondral cyst formation and osteophytosis about the posterior aspect of the joint. Subtalar Joints/Sinus Tarsi: Fluid extending out of the posterior subtalar joint into the lateral soft tissues of the foot is identified. The fluid measures approximately 2.5 cm transverse by 1.7 cm craniocaudal in the subcutaneous position by 1.3 cm long. Bones: No marrow edema to suggest osteomyelitis. Prominent subchondral cysts are seen in the lateral malleolus and subjacent lateral process of the talus. Joint space narrowing and cyst formation are seen about the calcaneocuboid joint. No  fracture. Other: Subcutaneous edema is present about the ankle. IMPRESSION: Negative for osteomyelitis. Subcutaneous edema about the ankle is compatible cellulitis or dependent change. Prominent tibiotalar joint effusion cannot be definitively characterized but there is no marrow edema about the joint as is typically seen in a septic joint. Fluid extending out of the posterior subtalar joint into the lateral subcutaneous soft tissues is compatible with a synovial or ganglion cyst. Small amount of fluid along the plantar surface of the flexor hallucis longus is compatible with tenosynovitis. Electronically Signed   By: Inge Rise M.D.   On: 05/24/2016 14:41      Current Discharge Medication List    START taking these medications   Details  acetaminophen (TYLENOL) 325 MG tablet Take 2 tablets (650 mg total) by mouth every 6 (six) hours as needed for mild pain (or Fever >/= 101). Qty: 30 tablet, Refills: 0    naproxen (NAPROSYN) 500 MG tablet Take 1 tablet (500 mg total) by mouth 2 (two) times daily with a meal. Ankle pain swelling Qty: 5 tablet, Refills: 0      CONTINUE these medications which have NOT CHANGED   Details  aspirin EC 81 MG tablet Take 81 mg by mouth daily.    Calcium Carbonate-Vitamin D (CALCIUM 600+D) 600-200 MG-UNIT TABS Take 1 tablet by mouth daily.    cephALEXin (KEFLEX) 500 MG capsule Take 1 capsule (500 mg total) by mouth 4 (four) times daily. Qty: 40 capsule, Refills: 0    clindamycin (CLEOCIN) 150 MG capsule Take 2 capsules (300 mg total) by mouth 3 (three) times daily. Qty: 60 capsule, Refills: 0    donepezil (ARICEPT) 10 MG tablet Take 10 mg by mouth 2 (two) times daily.     furosemide (LASIX) 40 MG tablet Take 40 mg by mouth daily.     potassium chloride SA (K-DUR,KLOR-CON) 20 MEQ tablet Take 20 mEq by mouth daily.     propranolol (INDERAL) 40 MG tablet Take 40 mg by mouth 2 (two) times daily.    temazepam (RESTORIL) 7.5 MG capsule Take 7.5 mg by  mouth at bedtime as needed for sleep.      STOP taking these medications     digoxin (LANOXIN) 0.125 MG tablet      diltiazem (CARDIZEM  CD) 300 MG 24 hr capsule            Management plans discussed with the patient and family and they arein agreement. Stable for discharge   Patient should follow up with pcp  CODE STATUS:     Code Status Orders        Start     Ordered   05/25/16 1200  Do not attempt resuscitation (DNR)  Continuous    Question Answer Comment  In the event of cardiac or respiratory ARREST Do not call a "code blue"   In the event of cardiac or respiratory ARREST Do not perform Intubation, CPR, defibrillation or ACLS   In the event of cardiac or respiratory ARREST Use medication by any route, position, wound care, and other measures to relive pain and suffering. May use oxygen, suction and manual treatment of airway obstruction as needed for comfort.      05/25/16 1200    Code Status History    Date Active Date Inactive Code Status Order ID Comments User Context   05/24/2016 12:27 PM 05/25/2016 12:00 PM Full Code [id number]  Epifanio Lesches, MD ED    Advance Directive Documentation     Most Recent Value  Type of Advance Directive  Living will  Pre-existing out of facility DNR order (yellow form or pink MOST form)  -  "MOST" Form in Place?  -      TOTAL TIME TAKING CARE OF THIS PATIENT: 37 minutes.    Note: This dictation was prepared with Dragon dictation along with smaller phrase technology. Any transcriptional errors that result from this process are unintentional.  Malaiya Paczkowski M.D on 05/26/2016 at 12:21 PM  Between 7am to 6pm - Pager - [phone] After 6pm go to [email or web address] - password [email or web address]  Office  [phone]  CC: Primary care physician; Glendon Axe, MD

## 2016-05-26 NOTE — Clinical Social Work Note (Signed)
Clinical Social Work Assessment  Patient Details  Name: Patricia Foley MRN: [id number] Date of Birth: 1931/12/11  Date of referral:  05/26/16               Reason for consult:  Facility Placement                Permission sought to share information with:  Chartered certified accountant granted to share information::  Yes, Verbal Permission Granted  Name::      [name]::   [name]   Relationship::     Contact Information:     Housing/Transportation Living arrangements for the past 2 months:  [place] of Information:  Power of Attorney, Other (Comment Required) (Niece and granddaughter ) Patient Interpreter Needed:  None Criminal Activity/Legal Involvement Pertinent to Current Situation/Hospitalization:  No - Comment as needed Significant Relationships:  Adult Children, Spouse, Other Family Members Lives with:  Spouse Do you feel safe going back to the place where you live?  Yes Need for family participation in patient care:  Yes (Comment)  Care giving concerns:  Patient lives in [place] with her husband.    Social Worker assessment / plan:  Holiday representative (CSW) received verbal consult from RN case manager that PT is recommending SNF and patient has a discharge order in for today. CSW Mudlogger approved 5 day LOG for pending [employer] authorization. CSW met with patient's niece [name] and granddaughter [name] and made them are of above. Family is agreeable to SNF search in [place]. CSW presented bed offers that can accept an LOG today and family chose [facility]. Per family patient usually walks at baseline and lives with her husband. Niece and granddaughter live near by.   Patient is medically stable for D/C to [facility] today. Per [facility] admissions coordinator at [facility] they will accept patient today room 502, start [employer] authorization and will accept a 5 day LOG. CSW sent D/C  orders to [facility] via [place]. RN will call report and arrange EMS for transport. Patient's niece and granddaughter are aware that if [facility] denies SNF then patient will have to D/C from [facility] after 5 days. Please reconsult if future social work needs arise. CSW signing off.   Employment status:  Retired Nurse, adult PT Recommendations:  [place] / Referral to community resources:  [place]  Patient/Family's Response to care: Patient's family is agreeable for her to D/C to [facility] today.   Patient/Family's Understanding of and Emotional Response to Diagnosis, Current Treatment, and Prognosis: Patient's family were very pleasant and thanked CSW for assistance.   Emotional Assessment Appearance:  Appears stated age Attitude/Demeanor/Rapport:    Affect (typically observed):  Pleasant Orientation:  Oriented to Self, Fluctuating Orientation (Suspected and/or reported [place]) Alcohol / Substance use:  Not Applicable Psych involvement (Current and /or in the community):  No (Comment)  Discharge Needs  Concerns to be addressed:  Discharge Planning Concerns Readmission within the last 30 days:  No Current discharge risk:  Dependent with Mobility Barriers to Discharge:  No Barriers Identified   Hawken Bielby, Veronia Beets, LCSW 05/26/2016, 4:48 PM

## 2016-05-26 NOTE — Progress Notes (Signed)
Patient presently resting in the bed, family  At bedside , rounded with MD at bedside patient and family updated about POC, continue wit ABt tx possible discharge rehab, will continue to monitor

## 2016-05-26 NOTE — Consult Note (Signed)
[facility] Cardiology  CARDIOLOGY CONSULT NOTE  Patient ID: Patricia Foley MRN: [id number] DOB/AGE: 06/02/1931 83 y.o.  Admit date: 05/24/2016 Referring Physician Vianne Bulls Primary Physician Candiss Norse Primary Cardiologist Fath Reason for Consultation Atrial fibrillation, bradycardia  HPI: 83 year old female referred for atrial fibrillation and bradycardia. The patient has a history of dementia, atrial fibrillation on aspirin, hypertension, COPD, and chronic diastolic congestive heart failure with last echo in 05/2013 revealing LVEF of 70-75%, mild mitral regurgitation, and mild to moderate tricuspid regurgitation. The patient was admitted for further management of cellulitis of right ankle, and was noted to be in sinus bradycardia at a rate of 48 bpm. Her home propanolol, Cardizem, and digoxin are now being held. Currently the patient states she does not have chest pain, experiences occasional palpitations without prolonged heart racing, and denies shortness of breath. She is in sinus rhythm at a rate of 73 bpm.     Past Medical History:  Diagnosis Date  . Anemia   . Arthritis   . Atrial fibrillation ([place])   . Circulation problem   . COPD (chronic obstructive pulmonary disease) ([place])   . Dementia   . Dysrhythmia    AFIB  . GERD (gastroesophageal reflux disease)   . HOH (hard of hearing)   . Hypertension   . Hypothyroidism   . Lymphedema   . Swelling of limb   . Tremors of nervous system   . Venous insufficiency   . Wheezing     Past Surgical History:  Procedure Laterality Date  . ABDOMINAL HYSTERECTOMY    . CAROTID STENT    . CATARACT EXTRACTION W/PHACO Right 05/04/2016   Procedure: CATARACT EXTRACTION PHACO AND INTRAOCULAR LENS PLACEMENT (IOC);  Surgeon: Birder Robson, MD;  Location: [facility] ORS;  Service: Ophthalmology;  Laterality: Right;  [place] 3:02.7 AP% 23.1 CDE 42.15 Fluid pack lot # [id number]    Prescriptions Prior to Admission  Medication Sig Dispense Refill Last Dose  .  aspirin EC 81 MG tablet Take 81 mg by mouth daily.   05/24/2016 at 0730  . Calcium Carbonate-Vitamin D (CALCIUM 600+D) 600-200 MG-UNIT TABS Take 1 tablet by mouth daily.   05/24/2016 at am  . cephALEXin (KEFLEX) 500 MG capsule Take 1 capsule (500 mg total) by mouth 4 (four) times daily. 40 capsule 0 05/24/2016 at am  . clindamycin (CLEOCIN) 150 MG capsule Take 2 capsules (300 mg total) by mouth 3 (three) times daily. 60 capsule 0 05/24/2016 at am  . digoxin (LANOXIN) 0.125 MG tablet Take 0.125 mg by mouth daily.    05/24/2016 at 0730  . diltiazem (CARDIZEM CD) 300 MG 24 hr capsule Take 300 mg by mouth daily.    05/24/2016 at am  . donepezil (ARICEPT) 10 MG tablet Take 10 mg by mouth 2 (two) times daily.    05/24/2016 at am  . furosemide (LASIX) 40 MG tablet Take 40 mg by mouth daily.    05/24/2016 at am  . potassium chloride SA (K-DUR,KLOR-CON) 20 MEQ tablet Take 20 mEq by mouth daily.    05/24/2016 at am  . propranolol (INDERAL) 40 MG tablet Take 40 mg by mouth 2 (two) times daily.   05/24/2016 at 0730  . temazepam (RESTORIL) 7.5 MG capsule Take 7.5 mg by mouth at bedtime as needed for sleep.   PRN at PRN   Social History   Social History  . Marital status: Widowed    Spouse name: N/A  . Number of children: N/A  . Years of education: N/A  Occupational History  . Not on file.   Social History Main Topics  . Smoking status: Current Every Day Smoker    Packs/day: 1.00    Years: 50.00  . Smokeless tobacco: Never Used  . Alcohol use No  . Drug use: No  . Sexual activity: Not on file   Other Topics Concern  . Not on file   Social History Narrative  . No narrative on file    Family History  Problem Relation Age of Onset  . Heart disease Father     Right foot pain     PHYSICAL EXAM  General: In no acute distress HEENT:  Normocephalic and atramatic Neck:  No JVD.  Lungs: Clear bilaterally to auscultation  Heart: HRRR . 3-6/1 systolic murmur heard best at LLSB Abdomen: Bowel sounds are  positive Msk:  Kyphosis. Lying in bed. Able to sit upright without difficulty Extremities: R pedal erythema, swelling Neuro: Alert and oriented  Psych:  Good affect, responds appropriately  Labs:   Lab Results  Component Value Date   WBC 8.3 05/25/2016   HGB 11.7 (L) 05/25/2016   HCT 33.8 (L) 05/25/2016   MCV 88.1 05/25/2016   PLT 205 05/25/2016    Recent Labs Lab 05/24/16 0800  05/26/16 0510  NA 132*  < > 139  K 2.7*  < > 4.2  CL 88*  < > 102  CO2 36*  < > 33*  BUN 18  < > 13  CREATININE 0.50  < > 0.39*  CALCIUM 9.0  < > 8.9  PROT 7.3  --   --   BILITOT 1.8*  --   --   ALKPHOS 77  --   --   ALT 14  --   --   AST 26  --   --   GLUCOSE 189*  < > 92  < > = values in this interval not displayed. Lab Results  Component Value Date   TROPONINI <0.03 07/20/2014   No results found for: CHOL No results found for: HDL No results found for: LDLCALC No results found for: TRIG No results found for: CHOLHDL No results found for: LDLDIRECT    Radiology: Dg Ankle Complete Right  Result Date: 05/22/2016 CLINICAL DATA:  Right foot/ankle pain x2 weeks, no known injury EXAM: RIGHT ANKLE - COMPLETE 3+ VIEW COMPARISON:  None. FINDINGS: No fracture or dislocation is seen. The ankle mortise is intact. The base of the fifth metatarsal is unremarkable. Mild diffuse soft tissue swelling. IMPRESSION: No fracture or dislocation is seen. Mild diffuse soft tissue swelling. Electronically Signed   By: Julian Hy M.D.   On: 05/22/2016 18:42   Mr Ankle Right Wo Contrast  Result Date: 05/24/2016 CLINICAL DATA:  The patient was seen 2 days ago in the emergency room for right ankle pain, swelling and erythema. No known injury. EXAM: MRI OF THE RIGHT ANKLE WITHOUT CONTRAST TECHNIQUE: Multiplanar, multisequence MR imaging of the ankle was performed. No intravenous contrast was administered. COMPARISON:  Plain films right ankle 05/22/2016. FINDINGS: Patient motion on all sequences degrades the exam.  TENDONS Peroneal: Intact. Posteromedial: Intact. There is some fluid along the undersurface of the flexor hallucis longus tendon as it passes deep to the calcaneus and navicular. Anterior: Intact. Achilles: Intact. Plantar Fascia: Unremarkable. LIGAMENTS Lateral: Intact. Medial: Intact. CARTILAGE Ankle Joint: Moderate tibiotalar joint effusion is identified. There is joint space narrowing with some subchondral cyst formation and osteophytosis about the posterior aspect of the joint. Subtalar Joints/Sinus  Tarsi: Fluid extending out of the posterior subtalar joint into the lateral soft tissues of the foot is identified. The fluid measures approximately 2.5 cm transverse by 1.7 cm craniocaudal in the subcutaneous position by 1.3 cm long. Bones: No marrow edema to suggest osteomyelitis. Prominent subchondral cysts are seen in the lateral malleolus and subjacent lateral process of the talus. Joint space narrowing and cyst formation are seen about the calcaneocuboid joint. No fracture. Other: Subcutaneous edema is present about the ankle. IMPRESSION: Negative for osteomyelitis. Subcutaneous edema about the ankle is compatible cellulitis or dependent change. Prominent tibiotalar joint effusion cannot be definitively characterized but there is no marrow edema about the joint as is typically seen in a septic joint. Fluid extending out of the posterior subtalar joint into the lateral subcutaneous soft tissues is compatible with a synovial or ganglion cyst. Small amount of fluid along the plantar surface of the flexor hallucis longus is compatible with tenosynovitis. Electronically Signed   By: Inge Rise M.D.   On: 05/24/2016 14:41   US Venous Img Lower Unilateral Right  Result Date: 05/22/2016 CLINICAL DATA:  Subacute onset of right ankle pain and swelling for 2 weeks. Initial encounter. EXAM: RIGHT LOWER EXTREMITY VENOUS DOPPLER ULTRASOUND TECHNIQUE: Gray-scale sonography with graded compression, as well as color  Doppler and duplex ultrasound were performed to evaluate the lower extremity deep venous systems from the level of the common femoral vein and including the common femoral, femoral, profunda femoral, popliteal and calf veins including the posterior tibial, peroneal and gastrocnemius veins when visible. The superficial great saphenous vein was also interrogated. Spectral Doppler was utilized to evaluate flow at rest and with distal augmentation maneuvers in the common femoral, femoral and popliteal veins. COMPARISON:  Right ankle radiographs performed earlier today at 6:03 p.m. FINDINGS: Contralateral Common Femoral Vein: Respiratory phasicity is normal and symmetric with the symptomatic side. No evidence of thrombus. Normal compressibility. Common Femoral Vein: No evidence of thrombus. Normal compressibility, respiratory phasicity and response to augmentation. Saphenofemoral Junction: No evidence of thrombus. Normal compressibility and flow on color Doppler imaging. Profunda Femoral Vein: No evidence of thrombus. Normal compressibility and flow on color Doppler imaging. Femoral Vein: No evidence of thrombus. Normal compressibility, respiratory phasicity and response to augmentation. Popliteal Vein: No evidence of thrombus. Normal compressibility, respiratory phasicity and response to augmentation. Calf Veins: No evidence of thrombus. Normal compressibility and flow on color Doppler imaging. Superficial Great Saphenous Vein: No evidence of thrombus. Normal compressibility and flow on color Doppler imaging. Venous Reflux:  None. Other Findings: A prominent 1.8 cm node is noted at the right inguinal region. IMPRESSION: 1. No evidence of deep venous thrombosis. 2. Prominent 1.8 cm node at the right inguinal region. Electronically Signed   By: Garald Balding M.D.   On: 05/22/2016 20:11    EKG: Sinus rhythm, 74 bpm  ASSESSMENT AND PLAN:  1. Atrial fibrillation, currently in sinus rhythm despite holding medications.  On aspirin for stroke prevention due to patient's dementia and increased falls risk 2. Bradycardia, now resolved with holding of medications   Recommendations: 1. Resume home propanolol 40 mg BID to prevent reflex tachycardia, with careful monitoring of heart rate and BP 2. Recommend repeat echocardiogram as outpatient. 3. Further recommendations pending patient's initial course  Signed: Clabe Seal , PA-C 05/26/2016, 8:02 AM

## 2016-05-26 NOTE — Progress Notes (Signed)
Pt DC'd to [facility] with non-emergent EMS. Report called to facility by previous nurse. Pt's granddaughter with pt. T/C made to [place] at [facility] that pt is enroute.

## 2016-05-26 NOTE — Evaluation (Signed)
Occupational Therapy Evaluation Patient Details Name: Patricia Foley MRN: [id number] DOB: Sep 24, 1931 Today's Date: 05/26/2016    History of Present Illness pt is an 83 yo female who presented to [facility] w/ increased pain of the R ankle/foot when WBing, imaging and orthopedic consult negative for fracture, admitted w/ cellulitis of the R ankle. PMH includes; anemia, OA, A-fib, COPD, dementia, GERD, HTN, Hypothyroid, swelling, Lymphedema, and tremors   Clinical Impression   Pt seen for OT evaluation this date, niece and granddaughter present for duration of session. Pt was living at home with 24hr supervision and occasional assist for self care tasks and assist for med mgt, meal prep, and household tasks. Granddaughter reports plans to install a ramp at home. Pt and family both report pt ambulates without assistive device however, touches or holds onto furniture and walls for security. Pt presents with pain in R ankle with weightbearing, decreased strength/activity tolerance/safety awareness, need for increased assist with ADL and increased falls risk. Pt would benefit from skilled OT services to address noted impairments and functional deficits in order to maximize return to PLOF. Recommend STR prior to return home to maximize functional independence and safety.     Follow Up Recommendations  SNF    Equipment Recommendations  Tub/shower seat;Toilet riser;Other (comment) (grab bars for shower and toilet, sock aid, reacher)    Recommendations for Other Services       Precautions / Restrictions Precautions Precautions: Fall Restrictions Weight Bearing Restrictions: Yes RLE Weight Bearing: Weight bearing as tolerated      Mobility Bed Mobility Overal bed mobility: Needs Assistance Bed Mobility: Supine to Sit;Sit to Supine     Supine to sit: Min guard Sit to supine: Min guard     Transfers         General transfer comment: deferred due to pt safety, pain with weightbearing    Balance Overall balance assessment: History of Falls;Needs assistance Sitting-balance support: Feet supported;Single extremity supported Sitting balance-Leahy Scale: Fair Sitting balance - Comments: pt able to maintain sitting posture for only brief intervals before leaning back or to the right, requires UE support when sitting w/o back support  Postural control: Posterior lean;Right lateral lean                           ADL either performed or assessed with clinical judgement   ADL Overall ADL's : Needs assistance/impaired Eating/Feeding: Set up;Bed level   Grooming: Bed level;Set up   Upper Body Bathing: Minimal assistance;Bed level   Lower Body Bathing: Bed level;Moderate assistance   Upper Body Dressing : Set up;Sitting   Lower Body Dressing: Bed level;Moderate assistance                 General ADL Comments: Pt generally mod assist for LB ADL, however all out of bed activity deferred due to safety/pain with weightbearing      Vision Baseline Vision/History: Wears glasses;Cataracts (rescheduled L cataract surgery for future) Wears Glasses: At all times Patient Visual Report: No change from baseline Vision Assessment?: No apparent visual deficits     Perception     Praxis Praxis Praxis tested?: Within functional limits    Pertinent Vitals/Pain Pain Assessment: No/denies pain Faces Pain Scale: Hurts even more Pain Location: R ankle and foot during WBing Pain Descriptors / Indicators: Aching;Discomfort Pain Intervention(s): Limited activity within patient's tolerance;Monitored during session;Repositioned     Hand Dominance     Extremity/Trunk Assessment Upper Extremity Assessment Upper  Extremity Assessment: Generalized weakness (4-/5 shoulder flexion bilaterally, all else 4/5, ROM WFL)   Lower Extremity Assessment Lower Extremity Assessment: Defer to PT evaluation RLE Deficits / Details: decreased strength grossly 3+/5  RLE: Unable to fully  assess due to pain   Cervical / Trunk Assessment Cervical / Trunk Assessment: Kyphotic   Communication Communication Communication: HOH   Cognition Arousal/Alertness: Awake/alert Behavior During Therapy: WFL for tasks assessed/performed Overall Cognitive Status: History of cognitive impairments - at baseline                                 General Comments: pt has dementia but is able to follow basic commands   General Comments       Exercises Other Exercises Other Exercises: pt/family educated in home modifications for bathroom to improve functional independence and safety including grab bars, shower chair, toilet riser   Shoulder Instructions      Home Living Family/patient expects to be discharged to:: Private residence Living Arrangements: Alone Available Help at Discharge: Personal care attendant;Family Type of Home: House Home Access: Stairs to enter [employer] of Steps: 3; niece reports going to install ramp soon Entrance Stairs-Rails: Right Home Layout: One level (step into kitchen)     Bathroom Shower/Tub: Teacher, early years/pre: Handicapped height Bathroom Accessibility: No   Home Equipment: None   Additional Comments: pt has a personal care attendant who stays with her during the day and her granddaughter or niece stay with her at night       Prior Functioning/Environment Level of Independence: Needs assistance  Gait / Transfers Assistance Needed: pt normally independent in household ambulation w/o any AD, however, 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, civil (consulting) ADL's / Homemaking Assistance Needed: has a personal care attendant who assists w/ cooking, cleaning and niece helps with some bathing/dressing tasks due to the patient's hisotry of dementia   Comments: normally independent in household mobility tasks        OT Problem List: Decreased strength;Pain;Decreased cognition;Decreased activity tolerance;Decreased  safety awareness;Decreased knowledge of use of DME or AE;Impaired balance (sitting and/or standing);Impaired vision/perception      OT Treatment/Interventions: Self-care/ADL training;Therapeutic exercise;Therapeutic activities;Energy conservation;DME and/or AE instruction;Patient/family education    OT Goals(Current goals can be found in the care plan section) Acute Rehab OT Goals Patient Stated Goal: get better OT Goal Formulation: With patient/family Time For Goal Achievement: 06/09/16 Potential to Achieve Goals: Good  OT Frequency: Min 1X/week   Barriers to D/C: Inaccessible home environment          Co-evaluation              End of Session    Activity Tolerance: Patient limited by fatigue Patient left: in bed;with call bell/phone within reach;with bed alarm set;with family/visitor present  OT Visit Diagnosis: Other abnormalities of gait and mobility (R26.89);Repeated falls (R29.6);Muscle weakness (generalized) (M62.81);History of falling (Z91.81);Pain Pain - Right/Left: Right Pain - part of body: Ankle and joints of foot                Time: 4818-5631 OT Time Calculation (min): 35 min Charges:  OT General Charges $OT Visit: 1 Procedure OT Evaluation $OT Eval Moderate Complexity: 1 Procedure OT Treatments $Self Care/Home Management : 23-37 mins G-Codes:     Jeni Salles, MPH, MS, OTR/L ascom [phone] 05/26/16, 4:33 PM

## 2016-05-26 NOTE — Care Management Important Message (Signed)
Important Message  Patient Details  Name: Patricia Foley MRN: [id number] Date of Birth: [birth year]   Medicare Important Message Given:  N/A - LOS <3 / Initial given by admissions    Katrina Stack, RN 05/26/2016, 4:40 PM

## 2016-05-26 NOTE — Progress Notes (Signed)
Patient is being discharge to rehab center , report called to receiving nurse, family notified, EMS called patient waiting for transport.

## 2016-05-26 NOTE — Care Management (Signed)
Spoke with patient and her niece.  Patient wishes to return home at discharge with home health and no agency preference.  Discussed need for light weight transport wheelchair, BSC and walker.  Awaiting PT and ortho consults.  Was later called to patient's room when granddaughter [name] arrived .  Tim- Jennifer's husband and niece that is present are patient's HCPOA.  After talking amongst themselves it is felt that if physical therapy recommends skilled nursing facility placement then will follow those recommendations because patient is now having to be carried to the bathroom since Friday- which is a marked decline in her functional status.  Notified attending.

## 2016-05-26 NOTE — Consult Note (Signed)
ORTHOPAEDIC CONSULTATION  REQUESTING PHYSICIAN: Bettey Costa, MD  Chief Complaint: Right ankle pain and swelling  HPI: Patricia Foley is a 83 y.o. female who explains that she has had right ankle pain which began last Wednesday. She was noted to have swelling and redness in the right ankle. She was taken to an urgent care and then later to the ER/   The ER felt she had cellulitis and she was started on clindamycin and Keflex. When the patient did not see improvement on the oral antibiotics she presented back to the hospital on Monday and was admitted for IV treatment of cellulitis. Patient is seen in the room today. She states she is feeling much better. Her daughter is at the bedside. She denies fevers or other systemic symptoms of infection.  Past Medical History:  Diagnosis Date  . Anemia   . Arthritis   . Atrial fibrillation ([place])   . Circulation problem   . COPD (chronic obstructive pulmonary disease) ([place])   . Dementia   . Dysrhythmia    AFIB  . GERD (gastroesophageal reflux disease)   . HOH (hard of hearing)   . Hypertension   . Hypothyroidism   . Lymphedema   . Swelling of limb   . Tremors of nervous system   . Venous insufficiency   . Wheezing    Past Surgical History:  Procedure Laterality Date  . ABDOMINAL HYSTERECTOMY    . CAROTID STENT    . CATARACT EXTRACTION W/PHACO Right 05/04/2016   Procedure: CATARACT EXTRACTION PHACO AND INTRAOCULAR LENS PLACEMENT (IOC);  Surgeon: Birder Robson, MD;  Location: [facility] ORS;  Service: Ophthalmology;  Laterality: Right;  [place] 3:02.7 AP% 23.1 CDE 42.15 Fluid pack lot # [id number]   Social History   Social History  . Marital status: Widowed    Spouse name: N/A  . Number of children: N/A  . Years of education: N/A   Social History Main Topics  . Smoking status: Current Every Day Smoker    Packs/day: 1.00    Years: 50.00  . Smokeless tobacco: Never Used  . Alcohol use No  . Drug use: No  . Sexual activity: Not Asked    Other Topics Concern  . None   Social History Narrative  . None   Family History  Problem Relation Age of Onset  . Heart disease Father    Allergies  Allergen Reactions  . Sulfa Antibiotics Hives  . Coumadin [Warfarin] Rash   Prior to Admission medications   Medication Sig Start Date End Date Taking? Authorizing Provider  aspirin EC 81 MG tablet Take 81 mg by mouth daily.   Yes Historical Provider, MD  Calcium Carbonate-Vitamin D (CALCIUM 600+D) 600-200 MG-UNIT TABS Take 1 tablet by mouth daily.   Yes Historical Provider, MD  cephALEXin (KEFLEX) 500 MG capsule Take 1 capsule (500 mg total) by mouth 4 (four) times daily. 05/22/16 06/01/16 Yes Darel Hong, MD  clindamycin (CLEOCIN) 150 MG capsule Take 2 capsules (300 mg total) by mouth 3 (three) times daily. 05/22/16 06/01/16 Yes Darel Hong, MD  digoxin (LANOXIN) 0.125 MG tablet Take 0.125 mg by mouth daily.    Yes Historical Provider, MD  diltiazem (CARDIZEM CD) 300 MG 24 hr capsule Take 300 mg by mouth daily.    Yes Historical Provider, MD  donepezil (ARICEPT) 10 MG tablet Take 10 mg by mouth 2 (two) times daily.    Yes Historical Provider, MD  furosemide (LASIX) 40 MG tablet Take 40 mg by mouth  daily.    Yes Historical Provider, MD  potassium chloride SA (K-DUR,KLOR-CON) 20 MEQ tablet Take 20 mEq by mouth daily.    Yes Historical Provider, MD  propranolol (INDERAL) 40 MG tablet Take 40 mg by mouth 2 (two) times daily.   Yes Historical Provider, MD  temazepam (RESTORIL) 7.5 MG capsule Take 7.5 mg by mouth at bedtime as needed for sleep.   Yes Historical Provider, MD  acetaminophen (TYLENOL) 325 MG tablet Take 2 tablets (650 mg total) by mouth every 6 (six) hours as needed for mild pain (or Fever >/= 101). 05/26/16   Bettey Costa, MD  naproxen (NAPROSYN) 500 MG tablet Take 1 tablet (500 mg total) by mouth 2 (two) times daily with a meal. Ankle pain swelling 05/26/16   Bettey Costa, MD   Mr Ankle Right Wo Contrast  Result Date:  05/24/2016 CLINICAL DATA:  The patient was seen 2 days ago in the emergency room for right ankle pain, swelling and erythema. No known injury. EXAM: MRI OF THE RIGHT ANKLE WITHOUT CONTRAST TECHNIQUE: Multiplanar, multisequence MR imaging of the ankle was performed. No intravenous contrast was administered. COMPARISON:  Plain films right ankle 05/22/2016. FINDINGS: Patient motion on all sequences degrades the exam. TENDONS Peroneal: Intact. Posteromedial: Intact. There is some fluid along the undersurface of the flexor hallucis longus tendon as it passes deep to the calcaneus and navicular. Anterior: Intact. Achilles: Intact. Plantar Fascia: Unremarkable. LIGAMENTS Lateral: Intact. Medial: Intact. CARTILAGE Ankle Joint: Moderate tibiotalar joint effusion is identified. There is joint space narrowing with some subchondral cyst formation and osteophytosis about the posterior aspect of the joint. Subtalar Joints/Sinus Tarsi: Fluid extending out of the posterior subtalar joint into the lateral soft tissues of the foot is identified. The fluid measures approximately 2.5 cm transverse by 1.7 cm craniocaudal in the subcutaneous position by 1.3 cm long. Bones: No marrow edema to suggest osteomyelitis. Prominent subchondral cysts are seen in the lateral malleolus and subjacent lateral process of the talus. Joint space narrowing and cyst formation are seen about the calcaneocuboid joint. No fracture. Other: Subcutaneous edema is present about the ankle. IMPRESSION: Negative for osteomyelitis. Subcutaneous edema about the ankle is compatible cellulitis or dependent change. Prominent tibiotalar joint effusion cannot be definitively characterized but there is no marrow edema about the joint as is typically seen in a septic joint. Fluid extending out of the posterior subtalar joint into the lateral subcutaneous soft tissues is compatible with a synovial or ganglion cyst. Small amount of fluid along the plantar surface of the flexor  hallucis longus is compatible with tenosynovitis. Electronically Signed   By: Inge Rise M.D.   On: 05/24/2016 14:41    Positive ROS: All other systems have been reviewed and were otherwise negative with the exception of those mentioned in the HPI and as above.  Physical Exam: General: Alert, no acute distress  MUSCULOSKELETAL: Right ankle: Patient has no overlying erythema. She has a small effusion of the right ankle.  She has tenderness over the anterior ankle to palpation no tenderness over the medial or lateral malleoli. There is no crepitus to palpation. She has a palpable dorsalis pedis pulse. She can flex and extend her toes and dorsiflex and plantarflex her ankle without pain. Passive dorsiflexion and plantarflexion the ankle do not cause her pain. She has no tenderness over the right foot. There is no calf tenderness or lower leg edema. She has no ascending lymphangitis.  Assessment: Right ankle pain likely due to underlying osteoarthritis  Plan: Patient is improving on IV antibiotics. She shows no evidence of erythema and according to the patient and her daughter the erythema is dramatically improved. Patient has no pain with active or passive motion of the right ankle and she is having no systemic signs of infection to suggest a septic joint. I believe the swelling she is having is due to inflammation likely secondary to underlying osteoarthritis. I recommend physical therapy evaluation to determine the patient's ability to weight-bear on the right lower extremity. She will likely need a walker for assistance with ambulation. PT evaluation will help determine whether the patient to be discharged home and will need a skilled nursing facility upon discharge. Patient does not have a walker at home and will likely need to be ordered for a rolling walker. Patient may follow up with me in 1-2 weeks in the office.  Discussed this plan with the patient and her daughter and they were in  agreement with the plan. I also discussed this plan with Dr. Benjie Karvonen.  If medically appropriate, I recommended the patient try a short course of an oral anti-inflammatory such as meloxicam 7.5 mg by mouth twice a day or naproxen 500 mg by mouth twice a day 8. Patient has atrial fibrillation but is not on an anticoagulant other than aspirin.  She has no underlying kidney disease, history of bleeding ulcer or cardiac stents.    Thornton Park, MD    05/26/2016 12:23 PM

## 2016-05-26 NOTE — Progress Notes (Signed)
PT Cancellation Note  Patient Details Name: Patricia Foley MRN: [id number] DOB: 11-17-1931   Cancelled Treatment:    Reason Eval/Treat Not Completed: Medical issues which prohibited therapy (Chart reviewed for re-attempt at PT evaluation.  Ortho consult still pending.  Will hold until patient cleared for activity/WBing efforts (to ensure no fracture).  will continue to follow and initiate as medically appropriate.)  Steffan Caniglia H. Owens Shark, PT, DPT, NCS 05/26/16, 10:03 AM [phone]

## 2016-05-26 NOTE — Progress Notes (Signed)
MEDICATION RELATED CONSULT NOTE - INITIAL   Pharmacy Consult for Electrolyte Management Indication: hypokalemia  Allergies  Allergen Reactions  . Sulfa Antibiotics Hives  . Coumadin [Warfarin] Rash   Labs: BMP Latest Ref Rng & Units 05/26/2016 05/25/2016 05/24/2016  Glucose 65 - 99 mg/dL 92 102(H) -  BUN 6 - 20 mg/dL 13 15 -  Creatinine 0.44 - 1.00 mg/dL 0.39(L) 0.57 -  Sodium 135 - 145 mmol/L 139 135 -  Potassium 3.5 - 5.1 mmol/L 4.2 3.3(L) 3.3(L)  Chloride 101 - 111 mmol/L 102 96(L) -  CO2 22 - 32 mmol/L 33(H) 32 -  Calcium 8.9 - 10.3 mg/dL 8.9 8.8(L) -   Medications:  Scheduled:  . aspirin EC  81 mg Oral Daily  . calcium-vitamin D  1 tablet Oral Daily  . docusate sodium  100 mg Oral BID  . donepezil  10 mg Oral QHS  . heparin  5,000 Units Subcutaneous Q8H  . potassium chloride SA  20 mEq Oral Daily  . propranolol  40 mg Oral BID  . vancomycin  500 mg Intravenous Q24H    Assessment: Patient with hypokalemia on admission. Currently ordered KCl 40mEq PO daily as was taking at home along with Lasix. Currently not on Lasix   Goal of Therapy:  Maintain electrolytes WNL  Plan:   No supplementation warranted @ this time. Will recheck electrolytes in 2 days.   Larene Beach, PharmD  05/26/2016 11:08 AM

## 2016-05-26 NOTE — Clinical Social Work Placement (Signed)
   CLINICAL SOCIAL WORK PLACEMENT  NOTE  Date:  05/26/2016  Patient Details  Name: Patricia Foley MRN: [id number] Date of Birth: 1931/10/21  Clinical Social Work is seeking post-discharge placement for this patient at the [place] level of care (*CSW will initial, date and re-position this form in  chart as items are completed):  Yes   Patient/family provided with [facility] Work Department's list of facilities offering this level of care within the geographic area requested by the patient (or if unable, by the patient's family).  Yes   Patient/family informed of their freedom to choose among providers that offer the needed level of care, that participate in Medicare, Medicaid or managed care program needed by the patient, have an available bed and are willing to accept the patient.  Yes   Patient/family informed of [facility]'s ownership interest in [facility] and [facility], as well as of the fact that they are under no obligation to receive care at these facilities.  PASRR submitted to EDS on 05/26/16     PASRR number received on 05/26/16     Existing PASRR number confirmed on       FL2 transmitted to all facilities in geographic area requested by pt/family on 05/26/16     FL2 transmitted to all facilities within larger geographic area on       Patient informed that his/her managed care company has contracts with or will negotiate with certain facilities, including the following:        Yes   Patient/family informed of bed offers received.  Patient chooses bed at  ([facility] (SNF) )     Physician recommends and patient chooses bed at      Patient to be transferred to  ([facility] (SNF) ) on 05/26/16.  Patient to be transferred to facility by  [facility] )     Patient family notified on 05/26/16 of transfer.  Name of family member notified:   (Patient's niece Veto Kemps and granddaughter [name] are aware  of D/C today. )     PHYSICIAN       Additional Comment:    _______________________________________________ Nettie Wyffels, Veronia Beets, LCSW 05/26/2016, 4:46 PM

## 2016-05-26 NOTE — Evaluation (Signed)
Physical Therapy Evaluation Patient Details Name: Patricia Foley MRN: [id number] DOB: 04-Mar-1931 Today's Date: 05/26/2016   History of Present Illness  pt is an 83 yo female who presented to [facility] w/ increased pain of the R ankle/foot when WBing, imaging and orthopedic consult negative for fracture, admitted w/ cellulitis of the R ankle. PMH includes; anemia, OA, A-fib, COPD, dementia, GERD, HTN, Hypothyroid, swelling, Lymphedema, and tremors    Clinical Impression  Pt was lying in bed w/ family at bedside and reported pain as moderate. Pt has a history of dementia, lives at home alone w/ a care attendant who is there during the day and her grand daughter or niece stay with her during the night. Pt is normally ambulatory w/o AD for household distances and is able to use the bathroom and bathe independently but recently has been unable to ambulate for the past week due to significant pain of the R ankle during WBing.  During PT evaluation, the patient displayed decreased strength throughout and required mod assistance to perform bed mobility, she transferred w/ min assist to standing but requires a RW and min assistance when standing due to unsteadiness. Attempted ambulating w/ a RW but patient appeared unsteady after taking two steps and began falling backwards when stepping back towards the bed requiring mod assist from PT to safely transfer to sitting at EOB. Pt currently presents w/ decreased strength, balance, increased pain and a history of cognitive deficits that prevent her from performing safe household mobility. She will benefit from skilled PT to correct deficits and return to her PLOF, recommend pt transition to STR following her acute hospital stay.     Follow Up Recommendations SNF    Equipment Recommendations  Rolling walker with 5" wheels;3in1 (PT)    Recommendations for Other Services OT consult     Precautions / Restrictions Precautions Precautions:  Fall Restrictions Weight Bearing Restrictions: Yes RLE Weight Bearing: Weight bearing as tolerated      Mobility  Bed Mobility Overal bed mobility: Needs Assistance Bed Mobility: Supine to Sit;Sit to Supine     Supine to sit: Mod assist Sit to supine: Mod assist   General bed mobility comments: pt requires some assistance to move LE's over EOB and scoot to bring feet on the ground,   Transfers Overall transfer level: Needs assistance Equipment used: Rolling walker (2 wheeled) Transfers: Sit to/from Stand Sit to Stand: Min assist         General transfer comment: pt able to transfer to standing w/ RW but is forward flexed throughout and leans hips posteriorly, requires hands on assist for safety and cuing to stand up tall  Ambulation/Gait Ambulation/Gait assistance: Mod assist Ambulation Distance (Feet): 1 Feet Assistive device: Rolling walker (2 wheeled) Gait Pattern/deviations: Step-to pattern;Trunk flexed   Gait velocity interpretation: <1.8 ft/sec, indicative of risk for recurrent falls General Gait Details: attempted to ambulate forward and backward for two steps and patient takes small steps and leans her trunk far forward, she became unsteady walking backwards w/ RW and required mod assist to help her return to the bed and prevent falling   Stairs            Wheelchair Mobility    Modified Rankin (Stroke Patients Only)       Balance Overall balance assessment: History of Falls;Needs assistance Sitting-balance support: Feet supported;Single extremity supported Sitting balance-Leahy Scale: Fair Sitting balance - Comments: pt able to maintain sitting posture for only brief intervals before leaning back or to  the right, requires UE support when sitting w/o back support and occasional min to mod assist Postural control: Posterior lean;Right lateral lean Standing balance support: Bilateral upper extremity supported Standing balance-Leahy Scale:  Poor Standing balance comment: pt requires B UE support and min to mod assistance to maintain standing                              Pertinent Vitals/Pain Pain Assessment: Faces Faces Pain Scale: Hurts even more Pain Location: R ankle and foot during WBing Pain Descriptors / Indicators: Aching;Discomfort Pain Intervention(s): Limited activity within patient's tolerance;Monitored during session;Repositioned    Home Living Family/patient expects to be discharged to:: Private residence Living Arrangements: Alone Available Help at Discharge: Personal care attendant;Family Type of Home: House Home Access: Stairs to enter     Home Layout: One level (has step into the kitchen) Home Equipment: None Additional Comments: pt has a personal care attendant who stays with her during the day and her granddaughter or niece stay with her at night     Prior Function Level of Independence: Needs assistance   Gait / Transfers Assistance Needed: pt normally independent in household ambulation w/o any AD, and performs bathing and dressing   ADL's / Homemaking Assistance Needed: has a personal care attendant who assists w/ some ADLs due to the patient's hisotry of dementia  Comments: normally independent in household mobility tasks     Hand Dominance        Extremity/Trunk Assessment   Upper Extremity Assessment Upper Extremity Assessment: Defer to OT evaluation    Lower Extremity Assessment Lower Extremity Assessment: RLE deficits/detail RLE Deficits / Details: decreased strength grossly 3+/5  RLE: Unable to fully assess due to pain       Communication   Communication: HOH  Cognition Arousal/Alertness: Awake/alert Behavior During Therapy: WFL for tasks assessed/performed Overall Cognitive Status: History of cognitive impairments - at baseline                                 General Comments: pt has dementia but is able to follow basic commands       General Comments      Exercises     Assessment/Plan    PT Assessment Patient needs continued PT services  PT Problem List Decreased strength;Decreased activity tolerance;Decreased balance;Decreased range of motion;Decreased mobility;Decreased cognition;Decreased knowledge of use of DME;Decreased safety awareness;Pain       PT Treatment Interventions Therapeutic activities;Therapeutic exercise;Gait training;DME instruction;Stair training;Functional mobility training;Balance training;Patient/family education    PT Goals (Current goals can be found in the Care Plan section)  Acute Rehab PT Goals Patient Stated Goal: To be able to walk again PT Goal Formulation: With patient/family Time For Goal Achievement: 06/09/16 Potential to Achieve Goals: Good    Frequency Min 2X/week   Barriers to discharge Inaccessible home environment;Decreased caregiver support pt unable to manuever her home environment and does not have adequate DME, pt's grand daughter and niece unable to provide a lot of physical assistance    Co-evaluation               End of Session Equipment Utilized During Treatment: Gait belt Activity Tolerance: Patient limited by pain Patient left: in bed;with call bell/phone within reach;with family/visitor present;with bed alarm set Nurse Communication: Mobility status PT Visit Diagnosis: Unsteadiness on feet (R26.81);History of falling (Z91.81);Muscle weakness (generalized) (M62.81);Pain Pain -  Right/Left: Right Pain - part of body: Ankle and joints of foot    Time: 1354-1411 PT Time Calculation (min) (ACUTE ONLY): 17 min   Charges:         PT G Codes:        [employer] Student PT 05/26/16, 3:22 PM [phone]   Ludene Stokke 05/26/2016, 3:12 PM

## 2016-05-26 NOTE — Progress Notes (Signed)
[facility] [place] NAME: Patricia Foley    MR#:  [id number]  DATE OF BIRTH:  Jan 21, 1932  SUBJECTIVE:  Patient ankle pain better  REVIEW OF SYSTEMS:    Review of Systems  Constitutional: Negative.  Negative for chills, fever and malaise/fatigue.  HENT: Negative.  Negative for ear discharge, ear pain, hearing loss, nosebleeds and sore throat.   Eyes: Negative.  Negative for blurred vision and pain.  Respiratory: Negative.  Negative for cough, hemoptysis, shortness of breath and wheezing.   Cardiovascular: Negative.  Negative for chest pain, palpitations and leg swelling.  Gastrointestinal: Negative.  Negative for abdominal pain, blood in stool, diarrhea, nausea and vomiting.  Genitourinary: Negative.  Negative for dysuria.  Musculoskeletal: Positive for joint pain. Negative for back pain.       Rught ankle swollen   Skin: Negative.   Neurological: Negative for dizziness, tremors, speech change, focal weakness, seizures and headaches.  Endo/Heme/Allergies: Negative.  Does not bruise/bleed easily.  Psychiatric/Behavioral: Positive for memory loss. Negative for depression, hallucinations and suicidal ideas.    Tolerating Diet:yes      DRUG ALLERGIES:   Allergies  Allergen Reactions  . Sulfa Antibiotics Hives  . Coumadin [Warfarin] Rash    VITALS:  Blood pressure (!) 141/59, pulse 63, temperature 98.9 F (37.2 C), temperature source Oral, resp. rate 18, height 4\' 11"  (1.499 m), weight 51.1 kg (112 lb 11.2 oz), SpO2 94 %.  PHYSICAL EXAMINATION:   Physical Exam  Constitutional: She is well-developed, well-nourished, and in no distress. No distress.  HENT:  Head: Normocephalic.  Eyes: No scleral icterus.  Neck: Normal range of motion. Neck supple. No JVD present. No tracheal deviation present.  Cardiovascular: Normal heart sounds.  Exam reveals no gallop and no friction rub.   No murmur heard. irr, irr slow HR   Pulmonary/Chest: Effort normal and breath sounds normal. No respiratory distress. She has no wheezes. She has no rales. She exhibits no tenderness.  Abdominal: Soft. Bowel sounds are normal. She exhibits no distension and no mass. There is no tenderness. There is no rebound and no guarding.  Musculoskeletal: Normal range of motion. She exhibits no edema.  Neurological: She is alert.  Skin: Skin is warm. No rash noted. No erythema.  Right ankle erythema improved Swelling with decreased ROM  Psychiatric: Affect normal.      LABORATORY PANEL:   CBC  Recent Labs Lab 05/25/16 0519  WBC 8.3  HGB 11.7*  HCT 33.8*  PLT 205   ------------------------------------------------------------------------------------------------------------------  Chemistries   Recent Labs Lab 05/24/16 0800  05/25/16 1222 05/26/16 0510  NA 132*  < >  --  139  K 2.7*  < >  --  4.2  CL 88*  < >  --  102  CO2 36*  < >  --  33*  GLUCOSE 189*  < >  --  92  BUN 18  < >  --  13  CREATININE 0.50  < >  --  0.39*  CALCIUM 9.0  < >  --  8.9  MG  --   --  1.7  --   AST 26  --   --   --   ALT 14  --   --   --   ALKPHOS 77  --   --   --   BILITOT 1.8*  --   --   --   < > = values in this interval not displayed. ------------------------------------------------------------------------------------------------------------------  Cardiac Enzymes No results for input(s): TROPONINI in the last 168 hours. ------------------------------------------------------------------------------------------------------------------  RADIOLOGY:  Mr Ankle Right Wo Contrast  Result Date: 05/24/2016 CLINICAL DATA:  The patient was seen 2 days ago in the emergency room for right ankle pain, swelling and erythema. No known injury. EXAM: MRI OF THE RIGHT ANKLE WITHOUT CONTRAST TECHNIQUE: Multiplanar, multisequence MR imaging of the ankle was performed. No intravenous contrast was administered. COMPARISON:  Plain films right ankle  05/22/2016. FINDINGS: Patient motion on all sequences degrades the exam. TENDONS Peroneal: Intact. Posteromedial: Intact. There is some fluid along the undersurface of the flexor hallucis longus tendon as it passes deep to the calcaneus and navicular. Anterior: Intact. Achilles: Intact. Plantar Fascia: Unremarkable. LIGAMENTS Lateral: Intact. Medial: Intact. CARTILAGE Ankle Joint: Moderate tibiotalar joint effusion is identified. There is joint space narrowing with some subchondral cyst formation and osteophytosis about the posterior aspect of the joint. Subtalar Joints/Sinus Tarsi: Fluid extending out of the posterior subtalar joint into the lateral soft tissues of the foot is identified. The fluid measures approximately 2.5 cm transverse by 1.7 cm craniocaudal in the subcutaneous position by 1.3 cm long. Bones: No marrow edema to suggest osteomyelitis. Prominent subchondral cysts are seen in the lateral malleolus and subjacent lateral process of the talus. Joint space narrowing and cyst formation are seen about the calcaneocuboid joint. No fracture. Other: Subcutaneous edema is present about the ankle. IMPRESSION: Negative for osteomyelitis. Subcutaneous edema about the ankle is compatible cellulitis or dependent change. Prominent tibiotalar joint effusion cannot be definitively characterized but there is no marrow edema about the joint as is typically seen in a septic joint. Fluid extending out of the posterior subtalar joint into the lateral subcutaneous soft tissues is compatible with a synovial or ganglion cyst. Small amount of fluid along the plantar surface of the flexor hallucis longus is compatible with tenosynovitis. Electronically Signed   By: Inge Rise M.D.   On: 05/24/2016 14:41     ASSESSMENT AND PLAN:   83 year old female with dementia who presents with right ankle pain and found to have cellulitis.  1. Right ankle pain with findings consistent with cellulitis and failed outpatient  treatment with clindamycin and keflex. Her cellulitis is much improved. I feel patient may continue her outpatient dose of clindamycin and Keflex Recent MRI shows no evidence of fracture. Her x-ray shows severe arthritis. She was evaluated by orthopedic surgery as well. This is not felt to be a septic joint. This is due to severe arthritis. Recommendations were for NSAID or meloxicam at discharge. await PT evaluation for d/c  2. Hypokalemia: Potassium was repleted.  3. Dementia: Continue Aricept  4. Chronic atrial fibrillation with Bradycardia: Her heart rates have improved. She will stop diltiazem and digoxin. She will continue propanolol. She was seen by cardiology service while in the hospital.    Management plans discussed with the patient and family and they are in agreement.  CODE STATUS: DNR  TOTAL TIME TAKING CARE OF THIS PATIENT: 37minutes.     POSSIBLE D/C today, DEPENDING ON CLINICAL CONDITION.   Anie Juniel M.D on 05/26/2016 at 1:45 PM  Between 7am to 6pm - Pager - [phone] After 6pm go to [email or web address] - password [email or web address] [facility]  Office  [phone]  CC: Primary care physician; Glendon Axe, MD  Note: This dictation was prepared with Dragon dictation along with smaller phrase technology. Any transcriptional errors that result from this process are unintentional.

## 2016-05-26 NOTE — NC FL2 (Signed)
[place] LEVEL OF CARE SCREENING TOOL     IDENTIFICATION  Patient Name: Patricia Foley Birthdate: [birth year] Sex: female Admission Date (Current Location): 05/24/2016  [place] and [place] Number:  Engineering geologist and Address:  [facility], [address], [place],  [address]      Provider Number: [id number]  Attending Physician Name and Address:  Bettey Costa, MD  Relative Name and Phone Number:       Current Level of Care: Hospital Recommended Level of Care: [facility] Prior Approval Number:    Date Approved/Denied:   PASRR Number:  [id number])  Discharge Plan: SNF    Current Diagnoses: Patient Active Problem List   Diagnosis Date Noted  . Cellulitis of right ankle 05/24/2016  . Cellulitis 05/24/2016  . Chronic venous insufficiency 10/04/2015  . Lymphedema 10/04/2015  . Pain in limb 10/04/2015    Orientation RESPIRATION BLADDER Height & Weight     Self, Time  Normal Continent Weight: 112 lb 11.2 oz (51.1 kg) Height:  4\' 11"  (149.9 cm)  BEHAVIORAL SYMPTOMS/MOOD NEUROLOGICAL BOWEL NUTRITION STATUS   (none)  (none) Continent Diet (Diet: 2 grams soduim. )  AMBULATORY STATUS COMMUNICATION OF NEEDS Skin   Extensive Assist Verbally Normal                       Personal Care Assistance Level of Assistance  Bathing, Feeding, Dressing Bathing Assistance: Limited assistance Feeding assistance: Independent Dressing Assistance: Limited assistance     Functional Limitations Info  Sight, Hearing, Speech Sight Info: Adequate Hearing Info: Adequate Speech Info: Adequate    SPECIAL CARE FACTORS FREQUENCY  PT (By licensed PT), OT (By licensed OT)     PT Frequency:  (5) OT Frequency:  (5)            Contractures      Additional Factors Info  Code Status, Allergies Code Status Info:  (DNR ) Allergies Info:  (Sulfa Antibiotics, Coumadin Warfarin)           Current Medications  (05/26/2016):  This is the current hospital active medication list Current Facility-Administered Medications  Medication Dose Route Frequency Provider Last Rate Last Dose  . 0.9 %  sodium chloride infusion   Intravenous Continuous Bettey Costa, MD 50 mL/hr at 05/25/16 1204    . acetaminophen (TYLENOL) tablet 650 mg  650 mg Oral Q6H PRN Epifanio Lesches, MD   650 mg at 05/25/16 2133   Or  . acetaminophen (TYLENOL) suppository 650 mg  650 mg Rectal Q6H PRN Epifanio Lesches, MD      . aspirin EC tablet 81 mg  81 mg Oral Daily Epifanio Lesches, MD   81 mg at 05/26/16 0957  . bisacodyl (DULCOLAX) EC tablet 5 mg  5 mg Oral Daily PRN Epifanio Lesches, MD      . calcium-vitamin D (OSCAL WITH D) 500-200 MG-UNIT per tablet 1 tablet  1 tablet Oral Daily Epifanio Lesches, MD   1 tablet at 05/26/16 0956  . diazepam (VALIUM) tablet 2 mg  2 mg Oral Q6H PRN Bettey Costa, MD      . docusate sodium (COLACE) capsule 100 mg  100 mg Oral BID Epifanio Lesches, MD   100 mg at 05/26/16 0956  . donepezil (ARICEPT) tablet 10 mg  10 mg Oral QHS Epifanio Lesches, MD   10 mg at 05/25/16 2133  . heparin injection 5,000 Units  5,000 Units Subcutaneous Q8H Epifanio Lesches, MD  5,000 Units at 05/26/16 1450  . ondansetron (ZOFRAN) tablet 4 mg  4 mg Oral Q6H PRN Epifanio Lesches, MD       Or  . ondansetron (ZOFRAN) injection 4 mg  4 mg Intravenous Q6H PRN Epifanio Lesches, MD      . potassium chloride SA (K-DUR,KLOR-CON) CR tablet 20 mEq  20 mEq Oral Daily Epifanio Lesches, MD   20 mEq at 05/26/16 0956  . propranolol (INDERAL) tablet 40 mg  40 mg Oral BID Clabe Seal, PA-C   40 mg at 05/26/16 0956  . temazepam (RESTORIL) capsule 7.5 mg  7.5 mg Oral QHS PRN Epifanio Lesches, MD   7.5 mg at 05/25/16 2133  . traZODone (DESYREL) tablet 25 mg  25 mg Oral QHS PRN Epifanio Lesches, MD   25 mg at 05/24/16 2315  . vancomycin (VANCOCIN) 500 mg in sodium chloride 0.9 % 100 mL IVPB  500 mg Intravenous Q24H  Epifanio Lesches, MD   500 mg at 05/26/16 1003     Discharge Medications: Please see discharge summary for a list of discharge medications.  Relevant Imaging Results:  Relevant Lab Results:   Additional Information  (SSN: [id number])  Tarquin Welcher, Veronia Beets, LCSW

## 2016-05-28 DIAGNOSIS — I480 Paroxysmal atrial fibrillation: Secondary | ICD-10-CM | POA: Diagnosis not present

## 2016-05-28 DIAGNOSIS — G301 Alzheimer's disease with late onset: Secondary | ICD-10-CM | POA: Diagnosis not present

## 2016-05-28 DIAGNOSIS — M19071 Primary osteoarthritis, right ankle and foot: Secondary | ICD-10-CM | POA: Insufficient documentation

## 2016-05-28 DIAGNOSIS — F0280 Dementia in other diseases classified elsewhere without behavioral disturbance: Secondary | ICD-10-CM | POA: Diagnosis not present

## 2016-06-01 ENCOUNTER — Ambulatory Visit: Admission: RE | Admit: 2016-06-01 | Payer: Medicare HMO | Source: Ambulatory Visit | Admitting: Ophthalmology

## 2016-06-01 ENCOUNTER — Encounter: Admission: RE | Payer: Self-pay | Source: Ambulatory Visit

## 2016-06-01 SURGERY — PHACOEMULSIFICATION, CATARACT, WITH IOL INSERTION
Anesthesia: Choice | Laterality: Left

## 2016-06-03 ENCOUNTER — Other Ambulatory Visit: Payer: Self-pay | Admitting: *Deleted

## 2016-06-03 NOTE — Patient Outreach (Signed)
[facility]) Care Management  06/03/2016  Patricia Foley 1931-06-27 [id number]   Met withTeresa Burt Knack, SW at facility. She reports patient will discharge this weekend.  She states patient has 24hour care between sitters and family, she lives with granddaughter and grandson-in-law.   She states that she will order home care and rolling walker.   No [facility] community care management needs identified at this time. Will sign off. Royetta Crochet. Laymond Purser, RN, BSN, [place] [phone]) Business Cell  [phone]) Toll Free Office

## 2016-06-10 DIAGNOSIS — G250 Essential tremor: Secondary | ICD-10-CM | POA: Diagnosis not present

## 2016-06-10 DIAGNOSIS — M19071 Primary osteoarthritis, right ankle and foot: Secondary | ICD-10-CM | POA: Diagnosis not present

## 2016-06-10 DIAGNOSIS — I482 Chronic atrial fibrillation: Secondary | ICD-10-CM | POA: Diagnosis not present

## 2016-06-10 DIAGNOSIS — L03115 Cellulitis of right lower limb: Secondary | ICD-10-CM | POA: Diagnosis not present

## 2016-06-10 DIAGNOSIS — G301 Alzheimer's disease with late onset: Secondary | ICD-10-CM | POA: Diagnosis not present

## 2016-06-10 DIAGNOSIS — J449 Chronic obstructive pulmonary disease, unspecified: Secondary | ICD-10-CM | POA: Diagnosis not present

## 2016-06-10 DIAGNOSIS — I509 Heart failure, unspecified: Secondary | ICD-10-CM | POA: Diagnosis not present

## 2016-06-10 DIAGNOSIS — F0280 Dementia in other diseases classified elsewhere without behavioral disturbance: Secondary | ICD-10-CM | POA: Diagnosis not present

## 2016-06-10 DIAGNOSIS — I110 Hypertensive heart disease with heart failure: Secondary | ICD-10-CM | POA: Diagnosis not present

## 2016-06-10 DIAGNOSIS — I872 Venous insufficiency (chronic) (peripheral): Secondary | ICD-10-CM | POA: Diagnosis not present

## 2016-06-21 DIAGNOSIS — R251 Tremor, unspecified: Secondary | ICD-10-CM | POA: Diagnosis not present

## 2016-06-21 DIAGNOSIS — H2512 Age-related nuclear cataract, left eye: Secondary | ICD-10-CM | POA: Diagnosis not present

## 2016-06-21 DIAGNOSIS — F0280 Dementia in other diseases classified elsewhere without behavioral disturbance: Secondary | ICD-10-CM | POA: Diagnosis not present

## 2016-06-21 DIAGNOSIS — I480 Paroxysmal atrial fibrillation: Secondary | ICD-10-CM | POA: Diagnosis not present

## 2016-06-21 DIAGNOSIS — D320 Benign neoplasm of cerebral meninges: Secondary | ICD-10-CM | POA: Diagnosis not present

## 2016-06-21 DIAGNOSIS — F5104 Psychophysiologic insomnia: Secondary | ICD-10-CM | POA: Diagnosis not present

## 2016-06-21 DIAGNOSIS — G301 Alzheimer's disease with late onset: Secondary | ICD-10-CM | POA: Diagnosis not present

## 2016-06-23 ENCOUNTER — Encounter: Payer: Self-pay | Admitting: *Deleted

## 2016-06-24 DIAGNOSIS — I83893 Varicose veins of bilateral lower extremities with other complications: Secondary | ICD-10-CM | POA: Diagnosis not present

## 2016-06-24 DIAGNOSIS — M545 Low back pain: Secondary | ICD-10-CM | POA: Diagnosis not present

## 2016-06-24 DIAGNOSIS — G8929 Other chronic pain: Secondary | ICD-10-CM | POA: Diagnosis not present

## 2016-06-24 DIAGNOSIS — R197 Diarrhea, unspecified: Secondary | ICD-10-CM | POA: Diagnosis not present

## 2016-06-29 ENCOUNTER — Ambulatory Visit: Payer: Medicare HMO | Admitting: Certified Registered Nurse Anesthetist

## 2016-06-29 ENCOUNTER — Encounter: Payer: Self-pay | Admitting: *Deleted

## 2016-06-29 ENCOUNTER — Encounter
Admission: RE | Disposition: A | Discharge status: Home / Self Care | Payer: Self-pay | Source: Ambulatory Visit | Attending: Ophthalmology

## 2016-06-29 ENCOUNTER — Ambulatory Visit
Admission: RE | Admit: 2016-06-29 | Discharge: 2016-06-29 | Disposition: A | Discharge status: Home / Self Care | Payer: Medicare HMO | Source: Ambulatory Visit | Attending: Ophthalmology | Admitting: Ophthalmology

## 2016-06-29 DIAGNOSIS — E039 Hypothyroidism, unspecified: Secondary | ICD-10-CM | POA: Insufficient documentation

## 2016-06-29 DIAGNOSIS — I4891 Unspecified atrial fibrillation: Secondary | ICD-10-CM | POA: Insufficient documentation

## 2016-06-29 DIAGNOSIS — J449 Chronic obstructive pulmonary disease, unspecified: Secondary | ICD-10-CM | POA: Insufficient documentation

## 2016-06-29 DIAGNOSIS — D649 Anemia, unspecified: Secondary | ICD-10-CM | POA: Insufficient documentation

## 2016-06-29 DIAGNOSIS — H2512 Age-related nuclear cataract, left eye: Secondary | ICD-10-CM | POA: Insufficient documentation

## 2016-06-29 DIAGNOSIS — I10 Essential (primary) hypertension: Secondary | ICD-10-CM | POA: Diagnosis not present

## 2016-06-29 DIAGNOSIS — F17200 Nicotine dependence, unspecified, uncomplicated: Secondary | ICD-10-CM | POA: Insufficient documentation

## 2016-06-29 DIAGNOSIS — I739 Peripheral vascular disease, unspecified: Secondary | ICD-10-CM | POA: Insufficient documentation

## 2016-06-29 DIAGNOSIS — Z955 Presence of coronary angioplasty implant and graft: Secondary | ICD-10-CM | POA: Insufficient documentation

## 2016-06-29 DIAGNOSIS — M1990 Unspecified osteoarthritis, unspecified site: Secondary | ICD-10-CM | POA: Insufficient documentation

## 2016-06-29 DIAGNOSIS — K219 Gastro-esophageal reflux disease without esophagitis: Secondary | ICD-10-CM | POA: Insufficient documentation

## 2016-06-29 HISTORY — DX: Cough, unspecified: R05.9

## 2016-06-29 HISTORY — PX: CATARACT EXTRACTION W/PHACO: SHX586

## 2016-06-29 HISTORY — DX: Cough: R05

## 2016-06-29 SURGERY — PHACOEMULSIFICATION, CATARACT, WITH IOL INSERTION
Anesthesia: Monitor Anesthesia Care | Site: Eye | Laterality: Left | Wound class: Clean

## 2016-06-29 MED ORDER — EPINEPHRINE PF 1 MG/ML IJ SOLN
INTRAOCULAR | Status: DC | PRN
  Administered 2016-06-29: 11:00:00 via OPHTHALMIC

## 2016-06-29 MED ORDER — LIDOCAINE HCL (PF) 4 % IJ SOLN
INTRAOCULAR | Status: DC | PRN
  Administered 2016-06-29: 4 mL via OPHTHALMIC

## 2016-06-29 MED ORDER — MOXIFLOXACIN HCL 0.5 % OP SOLN
OPHTHALMIC | Status: AC
  Filled 2016-06-29: qty 3

## 2016-06-29 MED ORDER — MOXIFLOXACIN HCL 0.5 % OP SOLN: 1.0000 [drp] | OPHTHALMIC | Status: DC | PRN

## 2016-06-29 MED ORDER — CARBACHOL 0.01 % IO SOLN
INTRAOCULAR | Status: DC | PRN
  Administered 2016-06-29: 0.5 mL via INTRAOCULAR

## 2016-06-29 MED ORDER — NA CHONDROIT SULF-NA HYALURON 40-17 MG/ML IO SOLN
INTRAOCULAR | Status: DC | PRN
  Administered 2016-06-29: 1 mL via INTRAOCULAR

## 2016-06-29 MED ORDER — TRYPAN BLUE 0.06 % OP SOLN
OPHTHALMIC | Status: AC
  Filled 2016-06-29: qty 0.5

## 2016-06-29 MED ORDER — POVIDONE-IODINE 5 % OP SOLN
OPHTHALMIC | Status: AC
  Filled 2016-06-29: qty 30

## 2016-06-29 MED ORDER — EPINEPHRINE PF 1 MG/ML IJ SOLN
INTRAMUSCULAR | Status: AC
  Filled 2016-06-29: qty 2

## 2016-06-29 MED ORDER — POVIDONE-IODINE 5 % OP SOLN
OPHTHALMIC | Status: DC | PRN
  Administered 2016-06-29: 1 via OPHTHALMIC

## 2016-06-29 MED ORDER — SODIUM CHLORIDE 0.9 % IV SOLN
INTRAVENOUS | Status: DC
  Administered 2016-06-29: 09:00:00 via INTRAVENOUS

## 2016-06-29 MED ORDER — LIDOCAINE HCL (PF) 2 % IJ SOLN
INTRAMUSCULAR | Status: AC
  Filled 2016-06-29: qty 2

## 2016-06-29 MED ORDER — ARMC OPHTHALMIC DILATING DROPS
1.0000 "application " | OPHTHALMIC | Status: AC
  Administered 2016-06-29 (×3): 1 via OPHTHALMIC

## 2016-06-29 MED ORDER — ARMC OPHTHALMIC DILATING DROPS
OPHTHALMIC | Status: AC
  Filled 2016-06-29: qty 0.4

## 2016-06-29 MED ORDER — TRYPAN BLUE 0.06 % OP SOLN
OPHTHALMIC | Status: DC | PRN
  Administered 2016-06-29: 0.5 mL via INTRAOCULAR

## 2016-06-29 MED ORDER — NA CHONDROIT SULF-NA HYALURON 40-17 MG/ML IO SOLN
INTRAOCULAR | Status: AC
  Filled 2016-06-29: qty 1

## 2016-06-29 MED ORDER — MOXIFLOXACIN HCL 0.5 % OP SOLN
OPHTHALMIC | Status: DC | PRN
  Administered 2016-06-29: 0.2 mL via OPHTHALMIC

## 2016-06-29 MED ORDER — FENTANYL CITRATE (PF) 100 MCG/2ML IJ SOLN
INTRAMUSCULAR | Status: AC
  Filled 2016-06-29: qty 2

## 2016-06-29 SURGICAL SUPPLY — 16 items
CANNULA ANT/CHMB 27GA (MISCELLANEOUS) ×6 IMPLANT
GLOVE BIO SURGEON STRL SZ8 (GLOVE) ×3 IMPLANT
GLOVE BIOGEL M 6.5 STRL (GLOVE) ×3 IMPLANT
GLOVE SURG LX 8.0 MICRO (GLOVE) ×2
GLOVE SURG LX STRL 8.0 MICRO (GLOVE) ×1 IMPLANT
GOWN STRL REUS W/ TWL LRG LVL3 (GOWN DISPOSABLE) ×2 IMPLANT
GOWN STRL REUS W/TWL LRG LVL3 (GOWN DISPOSABLE) ×4
LENS IOL TECNIS ITEC 19.5 (Intraocular Lens) ×3 IMPLANT
PACK CATARACT (MISCELLANEOUS) ×3 IMPLANT
PACK CATARACT BRASINGTON LX (MISCELLANEOUS) ×3 IMPLANT
SOL BSS BAG (MISCELLANEOUS) ×3
SOLUTION BSS BAG (MISCELLANEOUS) ×1 IMPLANT
SYR 3ML LL SCALE MARK (SYRINGE) ×3 IMPLANT
SYR 5ML LL (SYRINGE) ×3 IMPLANT
WATER STERILE IRR 250ML POUR (IV SOLUTION) ×3 IMPLANT
WIPE NON LINTING 3.25X3.25 (MISCELLANEOUS) ×3 IMPLANT

## 2016-06-29 NOTE — Op Note (Signed)
PREOPERATIVE DIAGNOSIS:  Nuclear sclerotic cataract of the left eye.   POSTOPERATIVE DIAGNOSIS:  Nuclear sclerotic cataract of the left eye.   OPERATIVE PROCEDURE: Procedure(s): CATARACT EXTRACTION PHACO AND INTRAOCULAR LENS PLACEMENT (IOC)   SURGEON:  Birder Robson, MD.   ANESTHESIA:  Anesthesiologist: Martha Clan, MD CRNA: Demetrius Charity, CRNA  1.      Managed anesthesia care. 2.     0.55ml of Shugarcaine was instilled following the paracentesis   COMPLICATIONS:  None.   TECHNIQUE:   Stop and chop   DESCRIPTION OF PROCEDURE:  The patient was examined and consented in the preoperative holding area where the aforementioned topical anesthesia was applied to the left eye and then brought back to the Operating Room where the left eye was prepped and draped in the usual sterile ophthalmic fashion and a lid speculum was placed. A paracentesis was created with the side port blade and the anterior chamber was filled with viscoelastic. A near clear corneal incision was performed with the steel keratome. A continuous curvilinear capsulorrhexis was performed with a cystotome followed by the capsulorrhexis forceps. Hydrodissection and hydrodelineation were carried out with BSS on a blunt cannula. The lens was removed in a stop and chop  technique and the remaining cortical material was removed with the irrigation-aspiration handpiece. The capsular bag was inflated with viscoelastic and the Technis [id number] lens was placed in the capsular bag without complication. The remaining viscoelastic was removed from the eye with the irrigation-aspiration handpiece. The wounds were hydrated. The anterior chamber was flushed with Miostat and the eye was inflated to physiologic pressure. 0.85ml Vigamox was placed in the anterior chamber. The wounds were found to be water tight. The eye was dressed with Vigamox. The patient was given protective glasses to wear throughout the day and a shield with which to sleep  tonight. The patient was also given drops with which to begin a drop regimen today and will follow-up with me in one day.  Implant Name Type Inv. Item Serial No. Manufacturer Lot No. LRB No. Used  LENS IOL DIOP 19.5 - [id number] Intraocular Lens LENS IOL DIOP 19.5 [id number] AMO   Left 1    Procedure(s) with comments: CATARACT EXTRACTION PHACO AND INTRAOCULAR LENS PLACEMENT (IOC) (Left) - [place] 2:53.3 AP% 27.7 CDE 48.16 Fluid Pack Lot # [id number]  Electronically signed: Marvin 06/29/2016 11:14 AM

## 2016-06-29 NOTE — Transfer of Care (Signed)
Immediate Anesthesia Transfer of Care Note  Patient: LANNETTE AVELLINO  Procedure(s) Performed: Procedure(s) with comments: CATARACT EXTRACTION PHACO AND INTRAOCULAR LENS PLACEMENT ([place]) (Left) - [place] 2:53.3 AP% 27.7 CDE 48.16 Fluid Pack Lot # [id number]  Patient Location: PACU  Anesthesia Type:MAC  Level of Consciousness: awake, alert  and oriented  Airway & Oxygen Therapy: Patient Spontanous Breathing  Post-op Assessment: Report given to RN and Post -op Vital signs reviewed and stable  Post vital signs: Reviewed and stable  Last Vitals:  Vitals:   06/29/16 0915  BP: (!) 150/55  Pulse: (!) 51  Resp: 16  Temp: 36.4 C    Last Pain:  Vitals:   06/29/16 0915  TempSrc: Tympanic         Complications: No apparent anesthesia complications

## 2016-06-29 NOTE — Anesthesia Postprocedure Evaluation (Signed)
Anesthesia Post Note  Patient: Patricia Foley  Procedure(s) Performed: Procedure(s) (LRB): CATARACT EXTRACTION PHACO AND INTRAOCULAR LENS PLACEMENT (IOC) (Left)  Patient location during evaluation: PACU Anesthesia Type: MAC Level of consciousness: awake and alert Pain management: satisfactory to patient Vital Signs Assessment: post-procedure vital signs reviewed and stable Respiratory status: respiratory function stable Cardiovascular status: blood pressure returned to baseline Anesthetic complications: no     Last Vitals:  Vitals:   06/29/16 0915  BP: (!) 150/55  Pulse: (!) 51  Resp: 16  Temp: 36.4 C    Last Pain:  Vitals:   06/29/16 0915  TempSrc: Tympanic                 [name]

## 2016-06-29 NOTE — Anesthesia Preprocedure Evaluation (Signed)
Anesthesia Evaluation  Patient identified by MRN, date of birth, ID band Patient confused    Reviewed: Allergy & Precautions, H&P , NPO status , Patient's Chart, lab work & pertinent test results  History of Anesthesia Complications Negative for: history of anesthetic complications  Airway Mallampati: III  TM Distance: >3 FB Neck ROM: full    Dental  (+) Poor Dentition, Chipped, Caps   Pulmonary COPD, Current Smoker, former smoker,           Cardiovascular Exercise Tolerance: Good hypertension, (-) angina+ Peripheral Vascular Disease  (-) Past MI and (-) DOE + dysrhythmias Atrial Fibrillation      Neuro/Psych PSYCHIATRIC DISORDERS negative neurological ROS     GI/Hepatic Neg liver ROS, GERD  Controlled,  Endo/Other  negative endocrine ROSHypothyroidism   Renal/GU      Musculoskeletal  (+) Arthritis ,   Abdominal   Peds  Hematology negative hematology ROS (+)   Anesthesia Other Findings Past Medical History: No date: Anemia No date: Arthritis No date: Atrial fibrillation (HCC) No date: Circulation problem No date: COPD (chronic obstructive pulmonary disease) (* No date: Dementia No date: Dysrhythmia     Comment: AFIB No date: GERD (gastroesophageal reflux disease) No date: HOH (hard of hearing) No date: Hypertension No date: Hypothyroidism No date: Lymphedema No date: Swelling of limb No date: Tremors of nervous system No date: Venous insufficiency No date: Wheezing  Past Surgical History: No date: ABDOMINAL HYSTERECTOMY No date: CAROTID STENT  BMI    Body Mass Index:  22.22 kg/m      Reproductive/Obstetrics negative OB ROS                             Anesthesia Physical  Anesthesia Plan  ASA: III  Anesthesia Plan: MAC   Post-op Pain Management:    Induction:   Airway Management Planned:   Additional Equipment:   Intra-op Plan:   Post-operative Plan:    Informed Consent: I have reviewed the patients History and Physical, chart, labs and discussed the procedure including the risks, benefits and alternatives for the proposed anesthesia with the patient or authorized representative who has indicated his/her understanding and acceptance.     Plan Discussed with: Anesthesiologist, CRNA and Surgeon  Anesthesia Plan Comments:         Anesthesia Quick Evaluation

## 2016-06-29 NOTE — H&P (Signed)
All labs reviewed. Abnormal studies sent to patients PCP when indicated.  Previous H&P reviewed, patient examined, there are NO CHANGES.  Patricia Foley LOUIS5/15/201810:26 AM

## 2016-06-29 NOTE — Discharge Instructions (Signed)
Eye Surgery Discharge Instructions  Expect mild scratchy sensation or mild soreness. DO NOT RUB YOUR EYE!  The day of surgery:  Minimal physical activity, but bed rest is not required  No reading, computer work, or close hand work  No bending, lifting, or straining.  May watch TV  For 24 hours:  No driving, legal decisions, or alcoholic beverages  Safety precautions  Eat anything you prefer: It is better to start with liquids, then soup then solid foods.  _____ Eye patch should be worn until postoperative exam tomorrow.  ____ Solar shield eyeglasses should be worn for comfort in the sunlight/patch while sleeping  Resume all regular medications including aspirin or Coumadin if these were discontinued prior to surgery. You may shower, bathe, shave, or wash your hair. Tylenol may be taken for mild discomfort.  Call your doctor if you experience significant pain, nausea, or vomiting, fever > 101 or other signs of infection. [phone] or [phone] Specific instructions:  Follow-up Information    Birder Robson, MD Follow up.   Specialty:  Ophthalmology Why:  May 16 at 9:45am Contact information: [address] [place] [address] [phone]

## 2016-06-29 NOTE — Anesthesia Post-op Follow-up Note (Cosign Needed)
Anesthesia QCDR form completed.        

## 2016-07-28 DIAGNOSIS — R946 Abnormal results of thyroid function studies: Secondary | ICD-10-CM | POA: Diagnosis not present

## 2016-07-28 DIAGNOSIS — R600 Localized edema: Secondary | ICD-10-CM | POA: Diagnosis not present

## 2016-07-28 DIAGNOSIS — I10 Essential (primary) hypertension: Secondary | ICD-10-CM | POA: Diagnosis not present

## 2016-07-28 DIAGNOSIS — G301 Alzheimer's disease with late onset: Secondary | ICD-10-CM | POA: Diagnosis not present

## 2016-07-28 DIAGNOSIS — F0280 Dementia in other diseases classified elsewhere without behavioral disturbance: Secondary | ICD-10-CM | POA: Diagnosis not present

## 2016-08-11 DIAGNOSIS — Z961 Presence of intraocular lens: Secondary | ICD-10-CM | POA: Diagnosis not present

## 2016-09-29 DIAGNOSIS — R509 Fever, unspecified: Secondary | ICD-10-CM | POA: Diagnosis not present

## 2016-09-29 DIAGNOSIS — N390 Urinary tract infection, site not specified: Secondary | ICD-10-CM | POA: Diagnosis not present

## 2016-09-29 DIAGNOSIS — H05012 Cellulitis of left orbit: Secondary | ICD-10-CM | POA: Diagnosis not present

## 2016-09-30 DIAGNOSIS — L03213 Periorbital cellulitis: Secondary | ICD-10-CM | POA: Diagnosis not present

## 2016-10-01 ENCOUNTER — Emergency Department: Payer: Medicare HMO

## 2016-10-01 ENCOUNTER — Encounter: Payer: Self-pay | Admitting: *Deleted

## 2016-10-01 ENCOUNTER — Inpatient Hospital Stay
Admission: EM | Admit: 2016-10-01 | Discharge: 2016-10-03 | DRG: 603 | Disposition: A | Discharge status: Nursing Home (Includes ICF) | Payer: Medicare HMO | Attending: Internal Medicine | Admitting: Internal Medicine

## 2016-10-01 DIAGNOSIS — I872 Venous insufficiency (chronic) (peripheral): Secondary | ICD-10-CM | POA: Diagnosis present

## 2016-10-01 DIAGNOSIS — Z888 Allergy status to other drugs, medicaments and biological substances status: Secondary | ICD-10-CM

## 2016-10-01 DIAGNOSIS — Z881 Allergy status to other antibiotic agents status: Secondary | ICD-10-CM | POA: Diagnosis not present

## 2016-10-01 DIAGNOSIS — Z8249 Family history of ischemic heart disease and other diseases of the circulatory system: Secondary | ICD-10-CM | POA: Diagnosis not present

## 2016-10-01 DIAGNOSIS — K219 Gastro-esophageal reflux disease without esophagitis: Secondary | ICD-10-CM | POA: Diagnosis present

## 2016-10-01 DIAGNOSIS — J449 Chronic obstructive pulmonary disease, unspecified: Secondary | ICD-10-CM | POA: Diagnosis present

## 2016-10-01 DIAGNOSIS — Z7982 Long term (current) use of aspirin: Secondary | ICD-10-CM | POA: Diagnosis not present

## 2016-10-01 DIAGNOSIS — Z66 Do not resuscitate: Secondary | ICD-10-CM | POA: Diagnosis not present

## 2016-10-01 DIAGNOSIS — Z90710 Acquired absence of both cervix and uterus: Secondary | ICD-10-CM | POA: Diagnosis not present

## 2016-10-01 DIAGNOSIS — Z87891 Personal history of nicotine dependence: Secondary | ICD-10-CM

## 2016-10-01 DIAGNOSIS — L03213 Periorbital cellulitis: Secondary | ICD-10-CM | POA: Diagnosis not present

## 2016-10-01 DIAGNOSIS — I480 Paroxysmal atrial fibrillation: Secondary | ICD-10-CM | POA: Diagnosis not present

## 2016-10-01 DIAGNOSIS — Z79899 Other long term (current) drug therapy: Secondary | ICD-10-CM

## 2016-10-01 DIAGNOSIS — I890 Lymphedema, not elsewhere classified: Secondary | ICD-10-CM | POA: Diagnosis not present

## 2016-10-01 DIAGNOSIS — Z95828 Presence of other vascular implants and grafts: Secondary | ICD-10-CM

## 2016-10-01 DIAGNOSIS — E039 Hypothyroidism, unspecified: Secondary | ICD-10-CM | POA: Diagnosis present

## 2016-10-01 DIAGNOSIS — L03211 Cellulitis of face: Principal | ICD-10-CM | POA: Diagnosis present

## 2016-10-01 DIAGNOSIS — I10 Essential (primary) hypertension: Secondary | ICD-10-CM | POA: Diagnosis not present

## 2016-10-01 DIAGNOSIS — F0390 Unspecified dementia without behavioral disturbance: Secondary | ICD-10-CM | POA: Diagnosis not present

## 2016-10-01 DIAGNOSIS — Z961 Presence of intraocular lens: Secondary | ICD-10-CM | POA: Diagnosis present

## 2016-10-01 DIAGNOSIS — I4891 Unspecified atrial fibrillation: Secondary | ICD-10-CM | POA: Diagnosis not present

## 2016-10-01 DIAGNOSIS — H9190 Unspecified hearing loss, unspecified ear: Secondary | ICD-10-CM | POA: Diagnosis present

## 2016-10-01 DIAGNOSIS — H0589 Other disorders of orbit: Secondary | ICD-10-CM | POA: Diagnosis not present

## 2016-10-01 DIAGNOSIS — R220 Localized swelling, mass and lump, head: Secondary | ICD-10-CM | POA: Diagnosis not present

## 2016-10-01 LAB — COMPREHENSIVE METABOLIC PANEL
ALT: 23 U/L (ref 14–54)
ANION GAP: 9 (ref 5–15)
AST: 27 U/L (ref 15–41)
Albumin: 3.7 g/dL (ref 3.5–5.0)
Alkaline Phosphatase: 110 U/L (ref 38–126)
BUN: 23 mg/dL — ABNORMAL HIGH (ref 6–20)
CHLORIDE: 103 mmol/L (ref 101–111)
CO2: 29 mmol/L (ref 22–32)
Calcium: 9.5 mg/dL (ref 8.9–10.3)
Creatinine, Ser: 0.61 mg/dL (ref 0.44–1.00)
GFR calc non Af Amer: >60 mL/min (ref >60)
Glucose, Bld: 102 mg/dL — ABNORMAL HIGH (ref 65–99)
Potassium: 4.2 mmol/L (ref 3.5–5.1)
SODIUM: 141 mmol/L (ref 135–145)
Total Bilirubin: 0.8 mg/dL (ref 0.3–1.2)
Total Protein: 7 g/dL (ref 6.5–8.1)

## 2016-10-01 LAB — CBC WITH DIFFERENTIAL/PLATELET
BASOS PCT: 0 %
Basophils Absolute: 0 10*3/uL (ref 0–0.1)
EOS ABS: 0.2 10*3/uL (ref 0–0.7)
Eosinophils Relative: 2 %
HEMATOCRIT: 31.1 % — AB (ref 35.0–47.0)
HEMOGLOBIN: 10.7 g/dL — AB (ref 12.0–16.0)
Lymphocytes Relative: 11 %
Lymphs Abs: 1.2 10*3/uL (ref 1.0–3.6)
MCH: 30.3 pg (ref 26.0–34.0)
MCHC: 34.2 g/dL (ref 32.0–36.0)
MCV: 88.4 fL (ref 80.0–100.0)
Monocytes Absolute: 1.5 10*3/uL — ABNORMAL HIGH (ref 0.2–0.9)
Monocytes Relative: 13 %
NEUTROS ABS: 8.4 10*3/uL — AB (ref 1.4–6.5)
NEUTROS PCT: 74 %
Platelets: 183 10*3/uL (ref 150–440)
RBC: 3.52 MIL/uL — AB (ref 3.80–5.20)
RDW: 14 % (ref 11.5–14.5)
WBC: 11.4 10*3/uL — ABNORMAL HIGH (ref 3.6–11.0)

## 2016-10-01 LAB — LACTIC ACID, PLASMA: Lactic Acid, Venous: 0.8 mmol/L (ref 0.5–1.9)

## 2016-10-01 LAB — PROCALCITONIN

## 2016-10-01 MED ORDER — CALCIUM CARBONATE-VITAMIN D 500-200 MG-UNIT PO TABS
1.0000 | ORAL_TABLET | Freq: Every day | ORAL | Status: DC
Start: 2016-10-02 — End: 2016-10-03
  Administered 2016-10-02 – 2016-10-03 (×2): 1 via ORAL
  Filled 2016-10-01 (×2): qty 1

## 2016-10-01 MED ORDER — ASPIRIN EC 81 MG PO TBEC
81.0000 mg | DELAYED_RELEASE_TABLET | Freq: Every day | ORAL | Status: DC
  Administered 2016-10-02 – 2016-10-03 (×2): 81 mg via ORAL
  Filled 2016-10-01 (×2): qty 1

## 2016-10-01 MED ORDER — ONDANSETRON HCL 4 MG PO TABS: 4.0000 mg | ORAL_TABLET | Freq: Four times a day (QID) | ORAL | Status: DC | PRN

## 2016-10-01 MED ORDER — ACETAMINOPHEN 650 MG RE SUPP: 650.0000 mg | Freq: Four times a day (QID) | RECTAL | Status: DC | PRN

## 2016-10-01 MED ORDER — IOPAMIDOL (ISOVUE-300) INJECTION 61%
75.0000 mL | Freq: Once | INTRAVENOUS | Status: AC | PRN
  Administered 2016-10-01: 75 mL via INTRAVENOUS

## 2016-10-01 MED ORDER — ONDANSETRON HCL 4 MG/2ML IJ SOLN: 4.0000 mg | Freq: Four times a day (QID) | INTRAMUSCULAR | Status: DC | PRN

## 2016-10-01 MED ORDER — QUETIAPINE FUMARATE 25 MG PO TABS
25.0000 mg | ORAL_TABLET | Freq: Every day | ORAL | Status: DC
  Administered 2016-10-02 – 2016-10-03 (×2): 25 mg via ORAL
  Filled 2016-10-01 (×2): qty 1

## 2016-10-01 MED ORDER — TEMAZEPAM 7.5 MG PO CAPS
7.5000 mg | ORAL_CAPSULE | Freq: Every evening | ORAL | Status: DC | PRN
  Administered 2016-10-01 – 2016-10-02 (×2): 7.5 mg via ORAL
  Filled 2016-10-01 (×2): qty 1

## 2016-10-01 MED ORDER — OLANZAPINE 2.5 MG PO TABS
2.5000 mg | ORAL_TABLET | Freq: Every day | ORAL | Status: DC
  Administered 2016-10-01 – 2016-10-02 (×2): 2.5 mg via ORAL
  Filled 2016-10-01 (×3): qty 1

## 2016-10-01 MED ORDER — VANCOMYCIN HCL IN DEXTROSE 1-5 GM/200ML-% IV SOLN
1000.0000 mg | Freq: Once | INTRAVENOUS | Status: AC
  Administered 2016-10-01: 1000 mg via INTRAVENOUS
  Filled 2016-10-01: qty 200

## 2016-10-01 MED ORDER — ENOXAPARIN SODIUM 40 MG/0.4ML ~~LOC~~ SOLN
40.0000 mg | SUBCUTANEOUS | Status: DC
  Administered 2016-10-01 – 2016-10-02 (×2): 40 mg via SUBCUTANEOUS
  Filled 2016-10-01 (×2): qty 0.4

## 2016-10-01 MED ORDER — ACETAMINOPHEN 325 MG PO TABS: 650.0000 mg | ORAL_TABLET | Freq: Four times a day (QID) | ORAL | Status: DC | PRN

## 2016-10-01 MED ORDER — SODIUM CHLORIDE 0.9 % IV SOLN: 250.0000 mL | INTRAVENOUS | Status: DC | PRN

## 2016-10-01 MED ORDER — SODIUM CHLORIDE 0.9% FLUSH
3.0000 mL | INTRAVENOUS | Status: DC | PRN
  Administered 2016-10-02: 3 mL via INTRAVENOUS
  Filled 2016-10-01: qty 3

## 2016-10-01 MED ORDER — VANCOMYCIN HCL 10 G IV SOLR
1250.0000 mg | INTRAVENOUS | Status: DC
  Administered 2016-10-02 – 2016-10-03 (×2): 1250 mg via INTRAVENOUS
  Filled 2016-10-01 (×3): qty 1250

## 2016-10-01 MED ORDER — SODIUM CHLORIDE 0.9% FLUSH
3.0000 mL | Freq: Two times a day (BID) | INTRAVENOUS | Status: DC
  Administered 2016-10-01 – 2016-10-03 (×4): 3 mL via INTRAVENOUS

## 2016-10-01 MED ORDER — POTASSIUM CHLORIDE CRYS ER 20 MEQ PO TBCR
20.0000 meq | EXTENDED_RELEASE_TABLET | Freq: Every day | ORAL | Status: DC
  Administered 2016-10-02 – 2016-10-03 (×2): 20 meq via ORAL
  Filled 2016-10-01 (×2): qty 1

## 2016-10-01 MED ORDER — DILTIAZEM HCL ER BEADS 300 MG PO CP24
300.0000 mg | ORAL_CAPSULE | Freq: Every day | ORAL | Status: DC
  Administered 2016-10-02 – 2016-10-03 (×2): 300 mg via ORAL
  Filled 2016-10-01 (×2): qty 1

## 2016-10-01 MED ORDER — DIGOXIN 125 MCG PO TABS
0.1250 mg | ORAL_TABLET | Freq: Every day | ORAL | Status: DC
  Administered 2016-10-02 – 2016-10-03 (×2): 0.125 mg via ORAL
  Filled 2016-10-01 (×2): qty 1

## 2016-10-01 MED ORDER — FLUTICASONE PROPIONATE 50 MCG/ACT NA SUSP
1.0000 | Freq: Every day | NASAL | Status: DC
  Administered 2016-10-02 – 2016-10-03 (×2): 1 via NASAL
  Filled 2016-10-01: qty 16

## 2016-10-01 MED ORDER — SODIUM CHLORIDE 0.9 % IV SOLN
3.0000 g | Freq: Four times a day (QID) | INTRAVENOUS | Status: DC
  Administered 2016-10-01 – 2016-10-03 (×8): 3 g via INTRAVENOUS
  Filled 2016-10-01 (×11): qty 3

## 2016-10-01 MED ORDER — DONEPEZIL HCL 10 MG PO TABS
20.0000 mg | ORAL_TABLET | Freq: Every day | ORAL | Status: DC
  Administered 2016-10-02 – 2016-10-03 (×2): 20 mg via ORAL
  Filled 2016-10-01 (×2): qty 2

## 2016-10-01 MED ORDER — NAPROXEN 500 MG PO TABS
500.0000 mg | ORAL_TABLET | Freq: Two times a day (BID) | ORAL | Status: DC
  Administered 2016-10-02 – 2016-10-03 (×3): 500 mg via ORAL
  Filled 2016-10-01 (×5): qty 1

## 2016-10-01 MED ORDER — FUROSEMIDE 40 MG PO TABS
40.0000 mg | ORAL_TABLET | Freq: Every day | ORAL | Status: DC
  Administered 2016-10-02 – 2016-10-03 (×2): 40 mg via ORAL
  Filled 2016-10-01 (×2): qty 1

## 2016-10-01 NOTE — ED Provider Notes (Signed)
[facility] Emergency Department Provider Note  ____________________________________________   First MD Initiated Contact with Patient 10/01/16 1211     (approximate)  I have reviewed the triage vital signs and the nursing notes.   HISTORY  Chief Complaint Eye Problem    HPI Patricia Foley is a 83 y.o. female who comes to the emergency Department with roughly 48 hours of painful swelling to the left side of her face. It has progressed insidiously. Began on Wednesday when she was seen in urgent care who diagnosed her with left-sided pre-septal cellulitis and began her on Keflex. Yesterday she followed up with her ophthalmologist who felt she may have a viral component as well and he began her on Valtrex without improvement. Today her ophthalmologist saw her again and advised her to come to the emergency department to evaluate for septal cellulitis. The patient denies change in her vision. She has no pain with extraocular motions. No fevers or chills.  Dr. Boone Master her Ophtho happy to consult on the patient [phone]  Past Medical History:  Diagnosis Date  . Anemia   . Arthritis   . Atrial fibrillation ([place])   . Circulation problem   . COPD (chronic obstructive pulmonary disease) ([place])   . Cough    CHRONIC  . Dementia   . Dysrhythmia    AFIB  . GERD (gastroesophageal reflux disease)   . HOH (hard of hearing)   . Hypertension   . Hypothyroidism   . Lymphedema   . Swelling of limb   . Tremors of nervous system   . Venous insufficiency   . Wheezing     Patient Active Problem List   Diagnosis Date Noted  . Cellulitis of right ankle 05/24/2016  . Cellulitis 05/24/2016  . Chronic venous insufficiency 10/04/2015  . Lymphedema 10/04/2015  . Pain in limb 10/04/2015    Past Surgical History:  Procedure Laterality Date  . ABDOMINAL HYSTERECTOMY    . CAROTID STENT    . CATARACT EXTRACTION W/PHACO Right 05/04/2016   Procedure: CATARACT  EXTRACTION PHACO AND INTRAOCULAR LENS PLACEMENT (IOC);  Surgeon: Birder Robson, MD;  Location: [facility] ORS;  Service: Ophthalmology;  Laterality: Right;  [place] 3:02.7 AP% 23.1 CDE 42.15 Fluid pack lot # [id number]  . CATARACT EXTRACTION W/PHACO Left 06/29/2016   Procedure: CATARACT EXTRACTION PHACO AND INTRAOCULAR LENS PLACEMENT (IOC);  Surgeon: Birder Robson, MD;  Location: [facility] ORS;  Service: Ophthalmology;  Laterality: Left;  [place] 2:53.3 AP% 27.7 CDE 48.16 Fluid Pack Lot # [id number]    Prior to Admission medications   Medication Sig Start Date End Date Taking? Authorizing Provider  aspirin EC 81 MG tablet Take 81 mg by mouth daily.   Yes [provider]  Calcium Carbonate-Vitamin D (CALCIUM 600+D) 600-200 MG-UNIT TABS Take 1 tablet by mouth daily.   Yes [provider]  cephALEXin (KEFLEX) 500 MG capsule Take 500 mg by mouth 3 (three) times daily.   Yes [provider]  digoxin (LANOXIN) 0.125 MG tablet Take 0.125 mg by mouth daily.   Yes [provider]  diltiazem (TIAZAC) 300 MG 24 hr capsule Take 300 mg by mouth daily.   Yes [provider]  donepezil (ARICEPT) 10 MG tablet Take 20 mg by mouth daily.    Yes [provider]  fluticasone (FLONASE) 50 MCG/ACT nasal spray Place 1 spray into the nose daily. 02/04/16 02/03/17 Yes [provider]  furosemide (LASIX) 40 MG tablet Take 40 mg by mouth daily.  Yes [provider]  OLANZapine (ZYPREXA) 2.5 MG tablet Take 2.5 mg by mouth at bedtime.   Yes [provider]  potassium chloride SA (K-DUR,KLOR-CON) 20 MEQ tablet Take 20 mEq by mouth daily.    Yes [provider]  QUEtiapine (SEROQUEL) 25 MG tablet Take 1 tablet by mouth daily. 09/07/16  Yes [provider]  temazepam (RESTORIL) 7.5 MG capsule Take 7.5 mg by mouth at bedtime as needed for sleep.   Yes [provider]  acetaminophen (TYLENOL) 325 MG tablet Take 2 tablets (650 mg total)  by mouth every 6 (six) hours as needed for mild pain (or Fever >/= 101). 05/26/16   Bettey Costa, MD  naproxen (NAPROSYN) 500 MG tablet Take 1 tablet (500 mg total) by mouth 2 (two) times daily with a meal. Ankle pain swelling 05/26/16   Bettey Costa, MD    Allergies Sulfa antibiotics and Coumadin [warfarin]  Family History  Problem Relation Age of Onset  . Heart disease Father     Social History Social History  Substance Use Topics  . Smoking status: Former Smoker    Packs/day: 1.00    Years: 50.00  . Smokeless tobacco: Never Used  . Alcohol use No    Review of Systems Constitutional: No fever/chills Eyes: No visual changes. ENT: No sore throat. Cardiovascular: Denies chest pain. Respiratory: Denies shortness of breath. Gastrointestinal: No abdominal pain.  No nausea, no vomiting.  No diarrhea.  No constipation. Genitourinary: Negative for dysuria. Musculoskeletal: Negative for back pain. Skin: positivefor rash. Neurological: Negative for headaches, focal weakness or numbness.   ____________________________________________   PHYSICAL EXAM:  VITAL SIGNS: ED Triage Vitals  Enc Vitals Group     BP 10/01/16 1150 (!) 128/49     Pulse Rate 10/01/16 1150 (!) 59     Resp 10/01/16 1150 18     Temp 10/01/16 1150 99 F (37.2 C)     Temp Source 10/01/16 1150 Oral     SpO2 10/01/16 1150 96 %     Weight 10/01/16 1148 131 lb (59.4 kg)     Height 10/01/16 1148 4\' 11"  (1.499 m)     Head Circumference --      Peak Flow --      Pain Score --      Pain Loc --      Pain Edu? --      Excl. in [place]? --     Constitutional: alert and oriented 4 pleasant cooperative speaks in full clear sentences no diaphoresis Eyes: PERRL EOMI. Head: Atraumatic. Nose: No congestion/rhinnorhea. Mouth/Throat: No trismus Neck: No stridor.   Cardiovascular: Normal rate, regular rhythm. Grossly normal heart sounds.  Good peripheral circulation. Respiratory: Normal respiratory effort.  No retractions.  Lungs CTAB and moving good air Gastrointestinal: soft nontender Musculoskeletal: No lower extremity edema   Neurologic:  Normal speech and language. No gross focal neurologic deficits are appreciated. Skin:  Deeply red well-demarcated rash on the left side of her face involving upper eyelid all the way down to her full left ear Psychiatric: Mood and affect are normal. Speech and behavior are normal.    ____________________________________________   DIFFERENTIAL includes but not limited to  Septal cellulitis, preseptal cellulitis, perichondritis, erysipelas, cellulitis ____________________________________________   LABS (all labs ordered are listed, but only abnormal results are displayed)  Labs Reviewed  COMPREHENSIVE METABOLIC PANEL - Abnormal; Notable for the following:       Result Value   Glucose, Bld 102 (*)  BUN 23 (*)    All other components within normal limits  CBC WITH DIFFERENTIAL/PLATELET - Abnormal; Notable for the following:    WBC 11.4 (*)    RBC 3.52 (*)    Hemoglobin 10.7 (*)    HCT 31.1 (*)    Neutro Abs 8.4 (*)    Monocytes Absolute 1.5 (*)    All other components within normal limits  CULTURE, BLOOD (ROUTINE X 2)  CULTURE, BLOOD (ROUTINE X 2)  LACTIC ACID, PLASMA  PROCALCITONIN    Slightly elevated white count is nonspecific __________________________________________  EKG   ____________________________________________  RADIOLOGY  CT head and face shows significant preseptal cellulitis on the left no abscess or other surgical pathology ____________________________________________   PROCEDURES  Procedure(s) performed: no  Procedures  Critical Care performed: no  Observation: no ____________________________________________   INITIAL IMPRESSION / ASSESSMENT AND PLAN / ED COURSE  Pertinent labs & imaging results that were available during my care of the patient were reviewed by me and considered in my medical decision making (see  chart for details).  The patient clearly has an infection involving the left sie of her face. It is deeply red and well demarcatedwhich actually to me looks rather like erysipelas. She has no discomfort when ranging her left eye. Infection has spread down towards her left ear so perichondritis is also a consideration. I think at this point she does require a CT scan of her face to evaluate for emergent surgical etiology.    Fortunately the patient's CT scan is negative for acute surgical pathology. I discussed the case with her ophthalmologist Dr. Boone Master who would be happy to consult on the case. At this point the patient has failed outpatient management requires inpatient admission for IV antibiotics.  ____________________________________________   FINAL CLINICAL IMPRESSION(S) / ED DIAGNOSES  Final diagnoses:  Facial cellulitis      NEW MEDICATIONS STARTED DURING THIS VISIT:  New Prescriptions   No medications on file     Note:  This document was prepared using Dragon voice recognition software and may include unintentional dictation errors.     Darel Hong, MD 10/01/16 [phone]

## 2016-10-01 NOTE — H&P (Addendum)
Patricia Foley at [place] NAME: Patricia Foley    MR#:  [id number]  DATE OF BIRTH:  04-09-1931  DATE OF ADMISSION:  10/01/2016  PRIMARY CARE PHYSICIAN: Glendon Axe, MD   REQUESTING/REFERRING PHYSICIAN: Darel Hong MD  CHIEF COMPLAINT:   Chief Complaint  Patient presents with  . Eye Problem    HISTORY OF PRESENT ILLNESS: Patricia Foley  is a 83 y.o. female with a known history of  Arthritis, atrial fibrillation, COPD, advanced dementia, intermittent hypertension, hypothyroidism who currently resides in a memory care unit who developed left-sided facial swelling since Wednesday. She was seen at the walk-in clinic and started on Keflex. Then was seen by her ophthalmologist yesterday who said her vision was okay and was started on Valtrex she was told to follow-up today. When she went back for a follow-up her face looked more swollen sent to the emergency room for further evaluation. Facial CT in the emergency room shows cellulitis of the face.  Patient's skin has excoriated lesions because she scratches herself according to the niece is at the bedside  PAST MEDICAL HISTORY:   Past Medical History:  Diagnosis Date  . Anemia   . Arthritis   . Atrial fibrillation ([place])   . Circulation problem   . COPD (chronic obstructive pulmonary disease) ([place])   . Cough    CHRONIC  . Dementia   . Dysrhythmia    AFIB  . GERD (gastroesophageal reflux disease)   . HOH (hard of hearing)   . Hypertension   . Hypothyroidism   . Lymphedema   . Swelling of limb   . Tremors of nervous system   . Venous insufficiency   . Wheezing     PAST SURGICAL HISTORY: Past Surgical History:  Procedure Laterality Date  . ABDOMINAL HYSTERECTOMY    . CAROTID STENT    . CATARACT EXTRACTION W/PHACO Right 05/04/2016   Procedure: CATARACT EXTRACTION PHACO AND INTRAOCULAR LENS PLACEMENT (IOC);  Surgeon: Birder Robson, MD;  Location: [facility] ORS;  Service: Ophthalmology;   Laterality: Right;  [place] 3:02.7 AP% 23.1 CDE 42.15 Fluid pack lot # [id number]  . CATARACT EXTRACTION W/PHACO Left 06/29/2016   Procedure: CATARACT EXTRACTION PHACO AND INTRAOCULAR LENS PLACEMENT (IOC);  Surgeon: Birder Robson, MD;  Location: [facility] ORS;  Service: Ophthalmology;  Laterality: Left;  [place] 2:53.3 AP% 27.7 CDE 48.16 Fluid Pack Lot # [id number]    SOCIAL HISTORY:  Social History  Substance Use Topics  . Smoking status: Former Smoker    Packs/day: 1.00    Years: 50.00  . Smokeless tobacco: Never Used  . Alcohol use No    FAMILY HISTORY:  Family History  Problem Relation Age of Onset  . Heart disease Father     DRUG ALLERGIES:  Allergies  Allergen Reactions  . Sulfa Antibiotics Hives  . Coumadin [Warfarin] Rash    REVIEW OF SYSTEMS:  Unable to obtain due to dementia MEDICATIONS AT HOME:  Prior to Admission medications   Medication Sig Start Date End Date Taking? Authorizing Provider  aspirin EC 81 MG tablet Take 81 mg by mouth daily.   Yes [provider]  Calcium Carbonate-Vitamin D (CALCIUM 600+D) 600-200 MG-UNIT TABS Take 1 tablet by mouth daily.   Yes [provider]  cephALEXin (KEFLEX) 500 MG capsule Take 500 mg by mouth 3 (three) times daily.   Yes [provider]  digoxin (LANOXIN) 0.125 MG tablet Take 0.125 mg by mouth daily.   Yes [provider]  diltiazem (TIAZAC) 300 MG 24 hr capsule Take 300 mg by mouth daily.   Yes [provider]  donepezil (ARICEPT) 10 MG tablet Take 20 mg by mouth daily.    Yes [provider]  fluticasone (FLONASE) 50 MCG/ACT nasal spray Place 1 spray into the nose daily. 02/04/16 02/03/17 Yes [provider]  furosemide (LASIX) 40 MG tablet Take 40 mg by mouth daily.    Yes [provider]  OLANZapine (ZYPREXA) 2.5 MG tablet Take 2.5 mg by mouth at bedtime.   Yes [provider]  potassium chloride SA (K-DUR,KLOR-CON) 20 MEQ tablet Take 20 mEq by  mouth daily.    Yes [provider]  QUEtiapine (SEROQUEL) 25 MG tablet Take 1 tablet by mouth daily. 09/07/16  Yes [provider]  temazepam (RESTORIL) 7.5 MG capsule Take 7.5 mg by mouth at bedtime as needed for sleep.   Yes [provider]  acetaminophen (TYLENOL) 325 MG tablet Take 2 tablets (650 mg total) by mouth every 6 (six) hours as needed for mild pain (or Fever >/= 101). 05/26/16   Bettey Costa, MD  naproxen (NAPROSYN) 500 MG tablet Take 1 tablet (500 mg total) by mouth 2 (two) times daily with a meal. Ankle pain swelling 05/26/16   Bettey Costa, MD      PHYSICAL EXAMINATION:   VITAL SIGNS: Blood pressure (!) 134/58, pulse 63, temperature 99 F (37.2 C), temperature source Oral, resp. rate 18, height 4\' 11"  (1.499 m), weight 131 lb (59.4 kg), SpO2 98 %.  GENERAL:  82 y.o.-year-old patient lying in the bed with no acute distress.  EYES: Pupils equal, round, reactive to light and accommodation. No scleral icterus. Extraocular muscles intact.  HEENT: Head atraumatic, normocephalic. Oropharynx and nasopharynx clear.  NECK:  Supple, no jugular venous distention. No thyroid enlargement, no tenderness.  LUNGS: Normal breath sounds bilaterally, no wheezing, rales,rhonchi or crepitation. No use of accessory muscles of respiration.  CARDIOVASCULAR: S1, S2 normal. No murmurs, rubs, or gallops.  ABDOMEN: Soft, nontender, nondistended. Bowel sounds present. No organomegaly or mass.  EXTREMITIES: No pedal edema, cyanosis, or clubbing.  NEUROLOGIC: Cranial nerves II through XII are intact. Muscle strength 5/5 in all extremities. Sensation intact. Gait not checked.  PSYCHIATRIC: Patient confused at baseline SKIN: Left facial swelling and erythema including her ears  Left facial swelling and erythemaLABORATORY PANEL:   CBC  Recent Labs Lab 10/01/16 1150  WBC 11.4*  HGB 10.7*  HCT 31.1*  PLT 183  MCV 88.4  MCH 30.3  MCHC 34.2  RDW 14.0  LYMPHSABS 1.2  MONOABS  1.5*  EOSABS 0.2  BASOSABS 0.0   ------------------------------------------------------------------------------------------------------------------  Chemistries   Recent Labs Lab 10/01/16 1150  NA 141  K 4.2  CL 103  CO2 29  GLUCOSE 102*  BUN 23*  CREATININE 0.61  CALCIUM 9.5  AST 27  ALT 23  ALKPHOS 110  BILITOT 0.8   ------------------------------------------------------------------------------------------------------------------ estimated creatinine clearance is 41.8 mL/min (by C-G formula based on SCr of 0.61 mg/dL). ------------------------------------------------------------------------------------------------------------------ No results for input(s): TSH, T4TOTAL, T3FREE, THYROIDAB in the last 72 hours.  Invalid input(s): FREET3   Coagulation profile No results for input(s): INR, PROTIME in the last 168 hours. ------------------------------------------------------------------------------------------------------------------- No results for input(s): DDIMER in the last 72 hours. -------------------------------------------------------------------------------------------------------------------  Cardiac Enzymes No results for input(s): CKMB, TROPONINI, MYOGLOBIN in the last 168 hours.  Invalid input(s): CK ------------------------------------------------------------------------------------------------------------------ Invalid input(s): POCBNP  ---------------------------------------------------------------------------------------------------------------  Urinalysis    Component Value Date/Time   COLORURINE  AMBER (A) 05/22/2016 2114   APPEARANCEUR HAZY (A) 05/22/2016 2114   APPEARANCEUR HAZY 05/21/2013 1518   LABSPEC 1.018 05/22/2016 2114   LABSPEC 1.020 05/21/2013 1518   PHURINE 6.0 05/22/2016 2114   GLUCOSEU NEGATIVE 05/22/2016 2114   GLUCOSEU NEGATIVE 05/21/2013 1518   HGBUR NEGATIVE 05/22/2016 2114   BILIRUBINUR NEGATIVE 05/22/2016 2114    BILIRUBINUR NEGATIVE 05/21/2013 1518   KETONESUR NEGATIVE 05/22/2016 2114   PROTEINUR NEGATIVE 05/22/2016 2114   NITRITE NEGATIVE 05/22/2016 2114   LEUKOCYTESUR NEGATIVE 05/22/2016 2114   LEUKOCYTESUR NEGATIVE 05/21/2013 1518     RADIOLOGY: Ct Soft Tissue Neck W Contrast  Result Date: 10/01/2016 CLINICAL DATA:  Left facial swelling EXAM: CT NECK WITH CONTRAST TECHNIQUE: Multidetector CT imaging of the neck was performed using the standard protocol following the bolus administration of intravenous contrast. CONTRAST:  70mL ISOVUE-300 IOPAMIDOL (ISOVUE-300) INJECTION 61% COMPARISON:  None. FINDINGS: Pharynx and larynx: --Nasopharynx: Fossae of Rosenmuller are clear. Normal adenoid tonsils for age. --Oral cavity and oropharynx: The palatine and lingual tonsils are normal. The visible oral cavity and floor of mouth are normal. --Hypopharynx: Normal vallecula and pyriform sinuses. --Larynx: Normal epiglottis and pre-epiglottic space. Normal aryepiglottic and vocal folds. --Retropharyngeal space: No abscess, effusion or lymphadenopathy. Salivary glands: --Parotid: No mass lesion or inflammation. No sialolithiasis or ductal dilatation. --Submandibular: Symmetric without inflammation. No sialolithiasis or ductal dilatation. --Sublingual: Normal. No ranula or other visible lesion of the base of tongue and floor of mouth. Thyroid: 1.8 cm partially calcified left thyroid nodule. Lymph nodes: There are scattered subcentimeter lymph nodes throughout the left cervical chain. No abnormal density nodes. Vascular: There is calcific aortic atherosclerosis and calcification within the bilateral carotid arteries without hemodynamically significant stenosis. Limited intracranial: Normal. Visualized orbits: Normal. Mastoids and visualized paranasal sinuses: No fluid levels or advanced mucosal thickening. No mastoid effusion. Skeleton: Multilevel cervical degenerative disc disease with partially calcified disc protrusions at  C2-3 and C3-4. No bony spinal canal stenosis. Upper chest: Clear. Other: None. IMPRESSION: 1. Left facial cellulitis without clear source. No abscess or drainable fluid collection. 2. 1.8 cm partially calcified nodule within the left thyroid lobe. Recommend further characterization with dedicated thyroid ultrasound on a nonemergent basis. 3.  Aortic Atherosclerosis (ICD10-I70.0). 4. Multiple subcentimeter left cervical lymph nodes are likely reactive. Electronically Signed   By: Ulyses Jarred M.D.   On: 10/01/2016 15:55   Ct Maxillofacial W Contrast  Result Date: 10/01/2016 CLINICAL DATA:  Left periorbital soft tissue swelling EXAM: CT MAXILLOFACIAL WITH CONTRAST TECHNIQUE: Multidetector CT imaging of the maxillofacial structures was performed with intravenous contrast. Multiplanar CT image reconstructions were also generated. CONTRAST:  59mL ISOVUE-300 IOPAMIDOL (ISOVUE-300) INJECTION 61% COMPARISON:  Brain MRI 04/08/2016 FINDINGS: Orbits: --Globes: Bilateral lens replacements.  Otherwise normal. --Bony orbit: Normal. --Preseptal soft tissues: There is moderate left periorbital soft tissue swelling. No intraorbital extension. --Intra- and extraconal orbital fat: Normal. No inflammatory stranding. --Optic nerves: Normal. --Lacrimal glands and fossae: Normal. --Extraocular muscles: Normal. Visualized sinuses: No fluid levels or advanced mucosal thickening. No mastoid or middle ear effusion. Soft tissues: Left preseptal soft tissue swelling, as above. Inflammation and edema extend inferiorly into the lower left facial soft tissues. No abscess or other fluid collection. Limited intracranial: Densely calcified anterior parafalcine meningioma. Atherosclerotic peripheral calcification of both internal carotid arteries at the skullbase. IMPRESSION: Left facial and periorbital cellulitis without postseptal/intraorbital extension. No abscess or drainable fluid collection. Electronically Signed   By: Ulyses Jarred M.D.    On: 10/01/2016 15:19  EKG: Orders placed or performed during the hospital encounter of 05/24/16  . EKG 12-Lead  . EKG 12-Lead    IMPRESSION AND PLAN: Patient is a 83 year old white female with advanced dementia with facial cellulitis  1. Left facial cellulitis I will treat with IV vancomycin and Unasyn  2. Atrial fibrillation continue aspirin , digoxin and diltiazem  3. Dementia continue Aricept and Zyprexa and cerebral  4. Hypothyroidism not on any supplements  5. Chronic lower extremity swelling with lymphedema continue lasix  6. Misc: lovenox for dvt proph  7. Code dnr confirmed with neice  All the records are reviewed and case discussed with ED provider. Management plans discussed with the patient, family and they are in agreement.  CODE STATUS:    Code Status Orders        Start     Ordered   10/01/16 1624  Do not attempt resuscitation (DNR)  Continuous    Question Answer Comment  In the event of cardiac or respiratory ARREST Do not call a "code blue"   In the event of cardiac or respiratory ARREST Do not perform Intubation, CPR, defibrillation or ACLS   In the event of cardiac or respiratory ARREST Use medication by any route, position, wound care, and other measures to relive pain and suffering. May use oxygen, suction and manual treatment of airway obstruction as needed for comfort.      10/01/16 1623    Code Status History    Date Active Date Inactive Code Status Order ID Comments User Context   05/25/2016 12:00 PM 05/26/2016 11:59 PM DNR [id number]  Bettey Costa, MD Inpatient   05/24/2016 12:27 PM 05/25/2016 12:00 PM Full Code [id number]  Epifanio Lesches, MD ED    Advance Directive Documentation     Most Recent Value  Type of Advance Directive  Healthcare Power of [place], Living will  Pre-existing out of facility DNR order (yellow form or pink MOST form)  -  "MOST" Form in Place?  -       TOTAL TIME TAKING CARE OF THIS PATIENT: 55 minutes.     Dustin Flock M.D on 10/01/2016 at 4:23 PM  Between 7am to 6pm - Pager - [phone]  After 6pm go to [email or web address] - password [email or web address] [facility]  [place] [facility]  Office  [phone]  CC: Primary care physician; Glendon Axe, MD

## 2016-10-01 NOTE — ED Triage Notes (Addendum)
Pt sent from eye doctor for eval and possible CT, swelling and redness to left eye area, states she was started on Keflex and valtrex yesterday, denies any blurry vision or pain

## 2016-10-01 NOTE — Progress Notes (Signed)
Pharmacy Antibiotic Note  Patricia Foley is a 83 y.o. female admitted on 10/01/2016 with cellulitis.  Pharmacy has been consulted for vancomycin dosing.  Plan: Vancomycin 1250mg  IV every 24 hours.  Goal trough 10-15 mcg/mL.   Vancomycin trough ordered for 8/20@0130  before 4th dose  Height: 4\' 11"  (149.9 cm) Weight: 131 lb (59.4 kg) IBW/kg (Calculated) : 43.2  Temp (24hrs), Avg:99 F (37.2 C), Min:99 F (37.2 C), Max:99 F (37.2 C)   Recent Labs Lab 10/01/16 1150 10/01/16 1222  WBC 11.4*  --   CREATININE 0.61  --   LATICACIDVEN  --  0.8    Estimated Creatinine Clearance: 41.8 mL/min (by C-G formula based on SCr of 0.61 mg/dL).    Allergies  Allergen Reactions  . Sulfa Antibiotics Hives  . Coumadin [Warfarin] Rash    Antimicrobials this admission: 8/17 vancomycin  >>  8/17 unasyn >>      Microbiology results: 8/17 BCx: x2 pending   Thank you for allowing pharmacy to be a part of this patient's care.   Thomasenia Sales, PharmD, MBA, [facility]    10/01/2016 3:51 PM

## 2016-10-02 LAB — CBC
HEMATOCRIT: 29.7 % — AB (ref 35.0–47.0)
HEMOGLOBIN: 10.2 g/dL — AB (ref 12.0–16.0)
MCH: 30.7 pg (ref 26.0–34.0)
MCHC: 34.1 g/dL (ref 32.0–36.0)
MCV: 90.1 fL (ref 80.0–100.0)
Platelets: 184 10*3/uL (ref 150–440)
RBC: 3.3 MIL/uL — ABNORMAL LOW (ref 3.80–5.20)
RDW: 14 % (ref 11.5–14.5)
WBC: 10 10*3/uL (ref 3.6–11.0)

## 2016-10-02 LAB — BASIC METABOLIC PANEL
ANION GAP: 6 (ref 5–15)
BUN: 19 mg/dL (ref 6–20)
CALCIUM: 8.4 mg/dL — AB (ref 8.9–10.3)
CHLORIDE: 107 mmol/L (ref 101–111)
CO2: 29 mmol/L (ref 22–32)
Creatinine, Ser: 0.5 mg/dL (ref 0.44–1.00)
GFR calc non Af Amer: >60 mL/min (ref >60)
Glucose, Bld: 95 mg/dL (ref 65–99)
Potassium: 3.7 mmol/L (ref 3.5–5.1)
SODIUM: 142 mmol/L (ref 135–145)

## 2016-10-02 NOTE — Progress Notes (Signed)
[facility] NAME: Patricia Foley    MR#:  [id number]  DATE OF BIRTH:  03-18-1931  SUBJECTIVE:  CHIEF COMPLAINT:   Chief Complaint  Patient presents with  . Eye Problem   Pain around the left face and eye. No change in vision  REVIEW OF SYSTEMS:    Review of Systems  Unable to perform ROS: Dementia    DRUG ALLERGIES:   Allergies  Allergen Reactions  . Sulfa Antibiotics Hives  . Coumadin [Warfarin] Rash    VITALS:  Blood pressure (!) 145/55, pulse 65, temperature 98.2 F (36.8 C), temperature source Oral, resp. rate 16, height 4\' 11"  (1.499 m), weight 60.7 kg (133 lb 14.4 oz), SpO2 95 %.  PHYSICAL EXAMINATION:   Physical Exam  GENERAL:  83 y.o.-year-old patient lying in the bed with no acute distress.  EYES: Pupils equal, round, reactive to light and accommodation.  Redness around left eye with swelling. Able to open eyes. HEENT: Head atraumatic, normocephalic. Oropharynx and nasopharynx clear.  NECK:  Supple, no jugular venous distention. No thyroid enlargement, no tenderness.  LUNGS: Normal breath sounds bilaterally, no wheezing, rales, rhonchi. No use of accessory muscles of respiration.  CARDIOVASCULAR: S1, S2 normal. No murmurs, rubs, or gallops.  ABDOMEN: Soft, nontender, nondistended. Bowel sounds present. No organomegaly or mass.  EXTREMITIES: Chronic bilateral lower extremity edema NEUROLOGIC: Cranial nerves II through XII are intact. No focal Motor or sensory deficits b/l.   PSYCHIATRIC: The patient is alert and awake SKIN: No obvious rash, lesion, or ulcer.   LABORATORY PANEL:   CBC  Recent Labs Lab 10/02/16 0343  WBC 10.0  HGB 10.2*  HCT 29.7*  PLT 184   ------------------------------------------------------------------------------------------------------------------ Chemistries   Recent Labs Lab 10/01/16 1150 10/02/16 0343  NA 141 142  K 4.2 3.7  CL 103 107  CO2 29 29  GLUCOSE 102*  95  BUN 23* 19  CREATININE 0.61 0.50  CALCIUM 9.5 8.4*  AST 27  --   ALT 23  --   ALKPHOS 110  --   BILITOT 0.8  --    ------------------------------------------------------------------------------------------------------------------  Cardiac Enzymes No results for input(s): TROPONINI in the last 168 hours. ------------------------------------------------------------------------------------------------------------------  RADIOLOGY:  Ct Soft Tissue Neck W Contrast  Result Date: 10/01/2016 CLINICAL DATA:  Left facial swelling EXAM: CT NECK WITH CONTRAST TECHNIQUE: Multidetector CT imaging of the neck was performed using the standard protocol following the bolus administration of intravenous contrast. CONTRAST:  60mL ISOVUE-300 IOPAMIDOL (ISOVUE-300) INJECTION 61% COMPARISON:  None. FINDINGS: Pharynx and larynx: --Nasopharynx: Fossae of Rosenmuller are clear. Normal adenoid tonsils for age. --Oral cavity and oropharynx: The palatine and lingual tonsils are normal. The visible oral cavity and floor of mouth are normal. --Hypopharynx: Normal vallecula and pyriform sinuses. --Larynx: Normal epiglottis and pre-epiglottic space. Normal aryepiglottic and vocal folds. --Retropharyngeal space: No abscess, effusion or lymphadenopathy. Salivary glands: --Parotid: No mass lesion or inflammation. No sialolithiasis or ductal dilatation. --Submandibular: Symmetric without inflammation. No sialolithiasis or ductal dilatation. --Sublingual: Normal. No ranula or other visible lesion of the base of tongue and floor of mouth. Thyroid: 1.8 cm partially calcified left thyroid nodule. Lymph nodes: There are scattered subcentimeter lymph nodes throughout the left cervical chain. No abnormal density nodes. Vascular: There is calcific aortic atherosclerosis and calcification within the bilateral carotid arteries without hemodynamically significant stenosis. Limited intracranial: Normal. Visualized orbits: Normal. Mastoids and  visualized paranasal sinuses: No fluid levels or advanced mucosal thickening. No mastoid effusion.  Skeleton: Multilevel cervical degenerative disc disease with partially calcified disc protrusions at C2-3 and C3-4. No bony spinal canal stenosis. Upper chest: Clear. Other: None. IMPRESSION: 1. Left facial cellulitis without clear source. No abscess or drainable fluid collection. 2. 1.8 cm partially calcified nodule within the left thyroid lobe. Recommend further characterization with dedicated thyroid ultrasound on a nonemergent basis. 3.  Aortic Atherosclerosis (ICD10-I70.0). 4. Multiple subcentimeter left cervical lymph nodes are likely reactive. Electronically Signed   By: Ulyses Jarred M.D.   On: 10/01/2016 15:55   Ct Maxillofacial W Contrast  Result Date: 10/01/2016 CLINICAL DATA:  Left periorbital soft tissue swelling EXAM: CT MAXILLOFACIAL WITH CONTRAST TECHNIQUE: Multidetector CT imaging of the maxillofacial structures was performed with intravenous contrast. Multiplanar CT image reconstructions were also generated. CONTRAST:  44mL ISOVUE-300 IOPAMIDOL (ISOVUE-300) INJECTION 61% COMPARISON:  Brain MRI 04/08/2016 FINDINGS: Orbits: --Globes: Bilateral lens replacements.  Otherwise normal. --Bony orbit: Normal. --Preseptal soft tissues: There is moderate left periorbital soft tissue swelling. No intraorbital extension. --Intra- and extraconal orbital fat: Normal. No inflammatory stranding. --Optic nerves: Normal. --Lacrimal glands and fossae: Normal. --Extraocular muscles: Normal. Visualized sinuses: No fluid levels or advanced mucosal thickening. No mastoid or middle ear effusion. Soft tissues: Left preseptal soft tissue swelling, as above. Inflammation and edema extend inferiorly into the lower left facial soft tissues. No abscess or other fluid collection. Limited intracranial: Densely calcified anterior parafalcine meningioma. Atherosclerotic peripheral calcification of both internal carotid arteries at  the skullbase. IMPRESSION: Left facial and periorbital cellulitis without postseptal/intraorbital extension. No abscess or drainable fluid collection. Electronically Signed   By: Ulyses Jarred M.D.   On: 10/01/2016 15:19     ASSESSMENT AND PLAN:   Patient is a 83 year old white female with advanced dementia with facial cellulitis  1. Left facial cellulitis Continue  IV vancomycin and Unasyn Some improvement today. Continues to have redness. Will need further IV antibiotics and patient.  2. Atrial fibrillation continue aspirin , digoxin and diltiazem  3. Dementia continue Aricept and Zyprexa  4. Hypothyroidism Other medications  5. Chronic lower extremity swelling with lymphedema continue lasix  DVT prophylaxis with Lovenox  All the records are reviewed and case discussed with Care Management/Social Worker Management plans discussed with the patient, family and they are in agreement.  CODE STATUS: DNR  DVT Prophylaxis: SCDs  TOTAL TIME TAKING CARE OF THIS PATIENT: 30 minutes.   POSSIBLE D/C IN 1-2 DAYS, DEPENDING ON CLINICAL CONDITION.  Hillary Bow R M.D on 10/02/2016 at 10:11 AM  Between 7am to 6pm - Pager - [phone]  After 6pm go to [email or web address] - password [email or web address] [facility]  Office  [phone]  CC: Primary care physician; Glendon Axe, MD  Note: This dictation was prepared with Dragon dictation along with smaller phrase technology. Any transcriptional errors that result from this process are unintentional.

## 2016-10-03 MED ORDER — AMOXICILLIN-POT CLAVULANATE 875-125 MG PO TABS: 1.0000 | ORAL_TABLET | Freq: Two times a day (BID) | ORAL | 0 refills | Status: AC

## 2016-10-03 NOTE — Progress Notes (Signed)
Patient discharged to [facility] per MD order. Discharge instructions sent to facility by envelope and reviewed with family. Escorted by family to facility.

## 2016-10-03 NOTE — Clinical Social Work Note (Signed)
CSW was alerted by the nurse that the patient's family will return her to the ALF/MCU when she is cleared. CSW will have packet once discharge is finalized.  Santiago Bumpers, MSW, Latanya Presser [phone]

## 2016-10-03 NOTE — Discharge Instructions (Signed)
Resume diet and activity as before ° ° °

## 2016-10-03 NOTE — NC FL2 (Addendum)
[place] LEVEL OF CARE SCREENING TOOL     IDENTIFICATION  Patient Name: Patricia Foley Birthdate: Jun 28, 1931 Sex: female Admission Date (Current Location): 10/01/2016  [place] and [place] Number:  Engineering geologist and Address:  [facility], [address], [place], [place] [address]      Provider Number: [id number]  Attending Physician Name and Address:  Hillary Bow, MD  Relative Name and Phone Number:       Current Level of Care: Hospital Recommended Level of Care: [place]  Memory Care Unit Prior Approval Number:    Date Approved/Denied:   PASRR Number: [id number]  Discharge Plan: Domiciliary (Rest home)    Current Diagnoses: Patient Active Problem List   Diagnosis Date Noted  . Cellulitis of face 10/01/2016  . Cellulitis of right ankle 05/24/2016  . Cellulitis 05/24/2016  . Chronic venous insufficiency 10/04/2015  . Lymphedema 10/04/2015  . Pain in limb 10/04/2015    Orientation RESPIRATION BLADDER Height & Weight     Self, Time, Situation  Normal Continent Weight: 133 lb 14.4 oz (60.7 kg) Height:  4\' 11"  (149.9 cm)  BEHAVIORAL SYMPTOMS/MOOD NEUROLOGICAL BOWEL NUTRITION STATUS      Continent    AMBULATORY STATUS COMMUNICATION OF NEEDS Skin   Independent Verbally Other (Comment) (Cellulitis on eye)                       Personal Care Assistance Level of Assistance  Bathing, Feeding, Dressing Bathing Assistance: Independent Feeding assistance: Independent Dressing Assistance: Independent     Functional Limitations Info  Sight Sight Info: Impaired        SPECIAL CARE FACTORS FREQUENCY                       Contractures Contractures Info: Not present    Additional Factors Info  Code Status, Allergies Code Status Info: Full Allergies Info: Sulfa Antibiotics, Coumadin Warfarin           Current Medications (10/03/2016):  This is the current hospital active  medication list Current Facility-Administered Medications  Medication Dose Route Frequency Provider Last Rate Last Dose  . 0.9 %  sodium chloride infusion  250 mL Intravenous PRN Dustin Flock, MD      . acetaminophen (TYLENOL) tablet 650 mg  650 mg Oral Q6H PRN Dustin Flock, MD       Or  . acetaminophen (TYLENOL) suppository 650 mg  650 mg Rectal Q6H PRN Dustin Flock, MD      . Ampicillin-Sulbactam (UNASYN) 3 g in sodium chloride 0.9 % 100 mL IVPB  3 g Intravenous Q6H Darel Hong, MD   Stopped at 10/03/16 [phone]  . aspirin EC tablet 81 mg  81 mg Oral Daily Dustin Flock, MD   81 mg at 10/03/16 0932  . calcium-vitamin D (OSCAL WITH D) 500-200 MG-UNIT per tablet 1 tablet  1 tablet Oral Daily Dustin Flock, MD   1 tablet at 10/03/16 0932  . digoxin (LANOXIN) tablet 0.125 mg  0.125 mg Oral Daily Dustin Flock, MD   0.125 mg at 10/03/16 0934  . diltiazem (TIAZAC) 24 hr capsule 300 mg  300 mg Oral Daily Dustin Flock, MD   300 mg at 10/03/16 0934  . donepezil (ARICEPT) tablet 20 mg  20 mg Oral Daily Dustin Flock, MD   20 mg at 10/03/16 0932  . fluticasone (FLONASE) 50 MCG/ACT nasal spray 1 spray  1 spray Each Nare Daily  Dustin Flock, MD   1 spray at 10/03/16 [phone]  . furosemide (LASIX) tablet 40 mg  40 mg Oral Daily Dustin Flock, MD   40 mg at 10/03/16 0932  . naproxen (NAPROSYN) tablet 500 mg  500 mg Oral BID WC Dustin Flock, MD   500 mg at 10/03/16 0934  . OLANZapine (ZYPREXA) tablet 2.5 mg  2.5 mg Oral QHS Dustin Flock, MD   2.5 mg at 10/02/16 2055  . ondansetron (ZOFRAN) tablet 4 mg  4 mg Oral Q6H PRN Dustin Flock, MD       Or  . ondansetron (ZOFRAN) injection 4 mg  4 mg Intravenous Q6H PRN Dustin Flock, MD      . potassium chloride SA (K-DUR,KLOR-CON) CR tablet 20 mEq  20 mEq Oral Daily Dustin Flock, MD   20 mEq at 10/03/16 0931  . QUEtiapine (SEROQUEL) tablet 25 mg  25 mg Oral Daily Dustin Flock, MD   25 mg at 10/03/16 0931  . sodium chloride flush  (NS) 0.9 % injection 3 mL  3 mL Intravenous Q12H Dustin Flock, MD   3 mL at 10/03/16 0935  . sodium chloride flush (NS) 0.9 % injection 3 mL  3 mL Intravenous PRN Dustin Flock, MD   3 mL at 10/02/16 0911  . temazepam (RESTORIL) capsule 7.5 mg  7.5 mg Oral QHS PRN Dustin Flock, MD   7.5 mg at 10/02/16 2055     Discharge Medications: Current Discharge Medication List        START taking these medications   Details  amoxicillin-clavulanate (AUGMENTIN) 875-125 MG tablet Take 1 tablet by mouth 2 (two) times daily. Qty: 14 tablet, Refills: 0          CONTINUE these medications which have NOT CHANGED   Details  aspirin EC 81 MG tablet Take 81 mg by mouth daily.    Calcium Carbonate-Vitamin D (CALCIUM 600+D) 600-200 MG-UNIT TABS Take 1 tablet by mouth daily.    digoxin (LANOXIN) 0.125 MG tablet Take 0.125 mg by mouth daily.    diltiazem (TIAZAC) 300 MG 24 hr capsule Take 300 mg by mouth daily.    donepezil (ARICEPT) 10 MG tablet Take 20 mg by mouth daily.     fluticasone (FLONASE) 50 MCG/ACT nasal spray Place 1 spray into the nose daily.    furosemide (LASIX) 40 MG tablet Take 40 mg by mouth daily.     OLANZapine (ZYPREXA) 2.5 MG tablet Take 2.5 mg by mouth at bedtime.    potassium chloride SA (K-DUR,KLOR-CON) 20 MEQ tablet Take 20 mEq by mouth daily.     QUEtiapine (SEROQUEL) 25 MG tablet Take 1 tablet by mouth daily.    temazepam (RESTORIL) 7.5 MG capsule Take 7.5 mg by mouth at bedtime as needed for sleep.    acetaminophen (TYLENOL) 325 MG tablet Take 2 tablets (650 mg total) by mouth every 6 (six) hours as needed for mild pain (or Fever >/= 101). Qty: 30 tablet, Refills: 0    naproxen (NAPROSYN) 500 MG tablet Take 1 tablet (500 mg total) by mouth 2 (two) times daily with a meal. Ankle pain swelling Qty: 5 tablet, Refills: 0         STOP taking these medications     cephALEXin (KEFLEX) 500 MG capsule          Relevant Imaging  Results:  Relevant Lab Results:   Additional Information SS# [id number]  Zettie Pho, LCSW

## 2016-10-03 NOTE — Discharge Summary (Signed)
[facility] NAME: Patricia Foley    MR#:  [id number]  DATE OF BIRTH:  January 01, 1932  DATE OF ADMISSION:  10/01/2016 ADMITTING PHYSICIAN: Dustin Flock, MD  DATE OF DISCHARGE: 10/03/2016  PRIMARY CARE PHYSICIAN: Glendon Axe, MD   ADMISSION DIAGNOSIS:  Facial cellulitis [L03.211]  DISCHARGE DIAGNOSIS:  Active Problems:   Cellulitis of face  SECONDARY DIAGNOSIS:   Past Medical History:  Diagnosis Date  . Anemia   . Arthritis   . Atrial fibrillation ([place])   . Circulation problem   . COPD (chronic obstructive pulmonary disease) ([place])   . Cough    CHRONIC  . Dementia   . Dysrhythmia    AFIB  . GERD (gastroesophageal reflux disease)   . HOH (hard of hearing)   . Hypertension   . Hypothyroidism   . Lymphedema   . Swelling of limb   . Tremors of nervous system   . Venous insufficiency   . Wheezing     ADMITTING HISTORY  HISTORY OF PRESENT ILLNESS: Patricia Foley  is a 83 y.o. female with a known history of  Arthritis, atrial fibrillation, COPD, advanced dementia, intermittent hypertension, hypothyroidism who currently resides in a memory care unit who developed left-sided facial swelling since Wednesday. She was seen at the walk-in clinic and started on Keflex. Then was seen by her ophthalmologist yesterday who said her vision was okay and was started on Valtrex she was told to follow-up today. When she went back for a follow-up her face looked more swollen sent to the emergency room for further evaluation. Facial CT in the emergency room shows cellulitis of the face.  Patient's skin has excoriated lesions because she scratches herself according to the niece is at the bedside  HOSPITAL COURSE:   Patient is a 83 year old white female with advanced dementia with facial cellulitis  1. Left facial cellulitis Continued  IV vancomycin and Unasyn in the hospital. Change to augmentin at discharge. Significant improvement of  cellulitis. No problem with vision.  2. Atrial fibrillation continue aspirin ,digoxin and diltiazem.  3. Dementia continue Aricept and Zyprexa.  4. Hypothyroidism  5. Chronic lower extremity swelling with lymphedema continue lasix  Stable for discharge back to ALF. F/U with PCP in 1 week  CONSULTS OBTAINED:    DRUG ALLERGIES:   Allergies  Allergen Reactions  . Sulfa Antibiotics Hives  . Coumadin [Warfarin] Rash    DISCHARGE MEDICATIONS:   Current Discharge Medication List    START taking these medications   Details  amoxicillin-clavulanate (AUGMENTIN) 875-125 MG tablet Take 1 tablet by mouth 2 (two) times daily. Qty: 14 tablet, Refills: 0      CONTINUE these medications which have NOT CHANGED   Details  aspirin EC 81 MG tablet Take 81 mg by mouth daily.    Calcium Carbonate-Vitamin D (CALCIUM 600+D) 600-200 MG-UNIT TABS Take 1 tablet by mouth daily.    digoxin (LANOXIN) 0.125 MG tablet Take 0.125 mg by mouth daily.    diltiazem (TIAZAC) 300 MG 24 hr capsule Take 300 mg by mouth daily.    donepezil (ARICEPT) 10 MG tablet Take 20 mg by mouth daily.     fluticasone (FLONASE) 50 MCG/ACT nasal spray Place 1 spray into the nose daily.    furosemide (LASIX) 40 MG tablet Take 40 mg by mouth daily.     OLANZapine (ZYPREXA) 2.5 MG tablet Take 2.5 mg by mouth at bedtime.    potassium chloride SA (K-DUR,KLOR-CON) 20 MEQ  tablet Take 20 mEq by mouth daily.     QUEtiapine (SEROQUEL) 25 MG tablet Take 1 tablet by mouth daily.    temazepam (RESTORIL) 7.5 MG capsule Take 7.5 mg by mouth at bedtime as needed for sleep.    acetaminophen (TYLENOL) 325 MG tablet Take 2 tablets (650 mg total) by mouth every 6 (six) hours as needed for mild pain (or Fever >/= 101). Qty: 30 tablet, Refills: 0    naproxen (NAPROSYN) 500 MG tablet Take 1 tablet (500 mg total) by mouth 2 (two) times daily with a meal. Ankle pain swelling Qty: 5 tablet, Refills: 0      STOP taking these  medications     cephALEXin (KEFLEX) 500 MG capsule         Today   VITAL SIGNS:  Blood pressure (!) 136/49, pulse 68, temperature 97.9 F (36.6 C), temperature source Oral, resp. rate 20, height 4\' 11"  (1.499 m), weight 60.7 kg (133 lb 14.4 oz), SpO2 97 %.  I/O:   Intake/Output Summary (Last 24 hours) at 10/03/16 1106 Last data filed at 10/03/16 1020  Gross per 24 hour  Intake              990 ml  Output                0 ml  Net              990 ml    PHYSICAL EXAMINATION:  Physical Exam  GENERAL:  83 y.o.-year-old patient lying in the bed with no acute distress.  LUNGS: Normal breath sounds bilaterally, no wheezing, rales,rhonchi or crepitation. No use of accessory muscles of respiration.  CARDIOVASCULAR: S1, S2 normal. No murmurs, rubs, or gallops.  ABDOMEN: Soft, non-tender, non-distended. Bowel sounds present. No organomegaly or mass. NEUROLOGIC: Moves all 4 extremities PSYCHIATRIC: The patient is alert and awake. Decreased hearing. SKIN: Mild redness around left eye  DATA REVIEW:   CBC  Recent Labs Lab 10/02/16 0343  WBC 10.0  HGB 10.2*  HCT 29.7*  PLT 184    Chemistries   Recent Labs Lab 10/01/16 1150 10/02/16 0343  NA 141 142  K 4.2 3.7  CL 103 107  CO2 29 29  GLUCOSE 102* 95  BUN 23* 19  CREATININE 0.61 0.50  CALCIUM 9.5 8.4*  AST 27  --   ALT 23  --   ALKPHOS 110  --   BILITOT 0.8  --     Cardiac Enzymes No results for input(s): TROPONINI in the last 168 hours.  Microbiology Results  Results for orders placed or performed during the hospital encounter of 10/01/16  Blood Culture (routine x 2)     Status: None (Preliminary result)   Collection Time: 10/01/16 12:27 PM  Result Value Ref Range Status   Specimen Description BLOOD BLOOD LEFT FOREARM  Final   Special Requests   Final    BOTTLES DRAWN AEROBIC AND ANAEROBIC Blood Culture adequate volume   Culture NO GROWTH 2 DAYS  Final   Report Status PENDING  Incomplete  Blood Culture  (routine x 2)     Status: None (Preliminary result)   Collection Time: 10/01/16 12:36 PM  Result Value Ref Range Status   Specimen Description BLOOD RIGHT ANTECUBITAL  Final   Special Requests   Final    BOTTLES DRAWN AEROBIC AND ANAEROBIC Blood Culture adequate volume   Culture NO GROWTH 2 DAYS  Final   Report Status PENDING  Incomplete  RADIOLOGY:  Ct Soft Tissue Neck W Contrast  Result Date: 10/01/2016 CLINICAL DATA:  Left facial swelling EXAM: CT NECK WITH CONTRAST TECHNIQUE: Multidetector CT imaging of the neck was performed using the standard protocol following the bolus administration of intravenous contrast. CONTRAST:  39mL ISOVUE-300 IOPAMIDOL (ISOVUE-300) INJECTION 61% COMPARISON:  None. FINDINGS: Pharynx and larynx: --Nasopharynx: Fossae of Rosenmuller are clear. Normal adenoid tonsils for age. --Oral cavity and oropharynx: The palatine and lingual tonsils are normal. The visible oral cavity and floor of mouth are normal. --Hypopharynx: Normal vallecula and pyriform sinuses. --Larynx: Normal epiglottis and pre-epiglottic space. Normal aryepiglottic and vocal folds. --Retropharyngeal space: No abscess, effusion or lymphadenopathy. Salivary glands: --Parotid: No mass lesion or inflammation. No sialolithiasis or ductal dilatation. --Submandibular: Symmetric without inflammation. No sialolithiasis or ductal dilatation. --Sublingual: Normal. No ranula or other visible lesion of the base of tongue and floor of mouth. Thyroid: 1.8 cm partially calcified left thyroid nodule. Lymph nodes: There are scattered subcentimeter lymph nodes throughout the left cervical chain. No abnormal density nodes. Vascular: There is calcific aortic atherosclerosis and calcification within the bilateral carotid arteries without hemodynamically significant stenosis. Limited intracranial: Normal. Visualized orbits: Normal. Mastoids and visualized paranasal sinuses: No fluid levels or advanced mucosal thickening. No  mastoid effusion. Skeleton: Multilevel cervical degenerative disc disease with partially calcified disc protrusions at C2-3 and C3-4. No bony spinal canal stenosis. Upper chest: Clear. Other: None. IMPRESSION: 1. Left facial cellulitis without clear source. No abscess or drainable fluid collection. 2. 1.8 cm partially calcified nodule within the left thyroid lobe. Recommend further characterization with dedicated thyroid ultrasound on a nonemergent basis. 3.  Aortic Atherosclerosis (ICD10-I70.0). 4. Multiple subcentimeter left cervical lymph nodes are likely reactive. Electronically Signed   By: Ulyses Jarred M.D.   On: 10/01/2016 15:55   Ct Maxillofacial W Contrast  Result Date: 10/01/2016 CLINICAL DATA:  Left periorbital soft tissue swelling EXAM: CT MAXILLOFACIAL WITH CONTRAST TECHNIQUE: Multidetector CT imaging of the maxillofacial structures was performed with intravenous contrast. Multiplanar CT image reconstructions were also generated. CONTRAST:  81mL ISOVUE-300 IOPAMIDOL (ISOVUE-300) INJECTION 61% COMPARISON:  Brain MRI 04/08/2016 FINDINGS: Orbits: --Globes: Bilateral lens replacements.  Otherwise normal. --Bony orbit: Normal. --Preseptal soft tissues: There is moderate left periorbital soft tissue swelling. No intraorbital extension. --Intra- and extraconal orbital fat: Normal. No inflammatory stranding. --Optic nerves: Normal. --Lacrimal glands and fossae: Normal. --Extraocular muscles: Normal. Visualized sinuses: No fluid levels or advanced mucosal thickening. No mastoid or middle ear effusion. Soft tissues: Left preseptal soft tissue swelling, as above. Inflammation and edema extend inferiorly into the lower left facial soft tissues. No abscess or other fluid collection. Limited intracranial: Densely calcified anterior parafalcine meningioma. Atherosclerotic peripheral calcification of both internal carotid arteries at the skullbase. IMPRESSION: Left facial and periorbital cellulitis without  postseptal/intraorbital extension. No abscess or drainable fluid collection. Electronically Signed   By: Ulyses Jarred M.D.   On: 10/01/2016 15:19    Follow up with PCP in 1 week.  Management plans discussed with the patient, family and they are in agreement.  CODE STATUS:     Code Status Orders        Start     Ordered   10/01/16 1727  Full code  Continuous     10/01/16 1726    Code Status History    Date Active Date Inactive Code Status Order ID Comments User Context   10/01/2016  4:23 PM 10/01/2016  5:26 PM DNR [id number]  Dustin Flock, MD ED   05/25/2016  12:00 PM 05/26/2016 11:59 PM DNR [id number]  Bettey Costa, MD Inpatient   05/24/2016 12:27 PM 05/25/2016 12:00 PM Full Code [id number]  Epifanio Lesches, MD ED    Advance Directive Documentation     Most Recent Value  Type of Advance Directive  Healthcare Power of Attorney, Living will  Pre-existing out of facility DNR order (yellow form or pink MOST form)  -  "MOST" Form in Place?  -      TOTAL TIME TAKING CARE OF THIS PATIENT ON DAY OF DISCHARGE: more than 30 minutes.   Hillary Bow R M.D on 10/03/2016 at 11:06 AM  Between 7am to 6pm - Pager - [phone]  After 6pm go to [email or web address] - password [email or web address] [facility]  Office  [phone]  CC: Primary care physician; Glendon Axe, MD  Note: This dictation was prepared with Dragon dictation along with smaller phrase technology. Any transcriptional errors that result from this process are unintentional.

## 2016-10-06 LAB — CULTURE, BLOOD (ROUTINE X 2)
CULTURE: NO GROWTH
Culture: NO GROWTH
SPECIAL REQUESTS: ADEQUATE
Special Requests: ADEQUATE

## 2016-10-12 DIAGNOSIS — R300 Dysuria: Secondary | ICD-10-CM | POA: Diagnosis not present

## 2016-10-13 DIAGNOSIS — L03213 Periorbital cellulitis: Secondary | ICD-10-CM | POA: Diagnosis not present

## 2016-11-03 DIAGNOSIS — L03211 Cellulitis of face: Secondary | ICD-10-CM | POA: Diagnosis not present

## 2016-11-04 ENCOUNTER — Ambulatory Visit: Payer: Self-pay | Admitting: Family Medicine

## 2016-11-11 ENCOUNTER — Ambulatory Visit (INDEPENDENT_AMBULATORY_CARE_PROVIDER_SITE_OTHER): Payer: Medicare HMO | Admitting: Family Medicine

## 2016-11-11 ENCOUNTER — Encounter: Payer: Self-pay | Admitting: Family Medicine

## 2016-11-11 VITALS — BP 118/54 | HR 64 | Temp 98.4°F | Resp 16 | Ht 59.0 in | Wt 134.0 lb

## 2016-11-11 DIAGNOSIS — F0281 Dementia in other diseases classified elsewhere with behavioral disturbance: Secondary | ICD-10-CM

## 2016-11-11 DIAGNOSIS — G301 Alzheimer's disease with late onset: Secondary | ICD-10-CM

## 2016-11-11 DIAGNOSIS — E039 Hypothyroidism, unspecified: Secondary | ICD-10-CM | POA: Diagnosis not present

## 2016-11-11 DIAGNOSIS — I482 Chronic atrial fibrillation, unspecified: Secondary | ICD-10-CM

## 2016-11-11 DIAGNOSIS — R251 Tremor, unspecified: Secondary | ICD-10-CM | POA: Diagnosis not present

## 2016-11-11 DIAGNOSIS — H9193 Unspecified hearing loss, bilateral: Secondary | ICD-10-CM | POA: Diagnosis not present

## 2016-11-11 DIAGNOSIS — Z593 Problems related to living in residential institution: Secondary | ICD-10-CM | POA: Diagnosis not present

## 2016-11-11 DIAGNOSIS — F0390 Unspecified dementia without behavioral disturbance: Secondary | ICD-10-CM | POA: Insufficient documentation

## 2016-11-11 DIAGNOSIS — M1990 Unspecified osteoarthritis, unspecified site: Secondary | ICD-10-CM | POA: Diagnosis not present

## 2016-11-11 DIAGNOSIS — H9190 Unspecified hearing loss, unspecified ear: Secondary | ICD-10-CM | POA: Insufficient documentation

## 2016-11-11 DIAGNOSIS — I10 Essential (primary) hypertension: Secondary | ICD-10-CM | POA: Diagnosis not present

## 2016-11-11 DIAGNOSIS — I4891 Unspecified atrial fibrillation: Secondary | ICD-10-CM | POA: Insufficient documentation

## 2016-11-11 DIAGNOSIS — J449 Chronic obstructive pulmonary disease, unspecified: Secondary | ICD-10-CM | POA: Diagnosis not present

## 2016-11-11 DIAGNOSIS — K219 Gastro-esophageal reflux disease without esophagitis: Secondary | ICD-10-CM

## 2016-11-11 DIAGNOSIS — Z7689 Persons encountering health services in other specified circumstances: Secondary | ICD-10-CM

## 2016-11-11 DIAGNOSIS — I872 Venous insufficiency (chronic) (peripheral): Secondary | ICD-10-CM | POA: Diagnosis not present

## 2016-11-11 DIAGNOSIS — L03211 Cellulitis of face: Secondary | ICD-10-CM

## 2016-11-11 DIAGNOSIS — D649 Anemia, unspecified: Secondary | ICD-10-CM | POA: Insufficient documentation

## 2016-11-11 DIAGNOSIS — F02818 Dementia in other diseases classified elsewhere, unspecified severity, with other behavioral disturbance: Secondary | ICD-10-CM

## 2016-11-11 MED ORDER — ALBUTEROL SULFATE HFA 108 (90 BASE) MCG/ACT IN AERS: 2.0000 | INHALATION_SPRAY | Freq: Four times a day (QID) | RESPIRATORY_TRACT | 3 refills | Status: DC | PRN

## 2016-11-11 NOTE — Patient Instructions (Signed)
Chronic Obstructive Pulmonary Disease Chronic obstructive pulmonary disease (COPD) is a common lung condition in which airflow from the lungs is limited. COPD is a general term that can be used to describe many different lung problems that limit airflow, including both chronic bronchitis and emphysema. If you have COPD, your lung function will probably never return to normal, but there are measures you can take to improve lung function and make yourself feel better. What are the causes?  Smoking (common).  Exposure to secondhand smoke.  Genetic problems.  Chronic inflammatory lung diseases or recurrent infections. What are the signs or symptoms?  Shortness of breath, especially with physical activity.  Deep, persistent (chronic) cough with a large amount of thick mucus.  Wheezing.  Rapid breaths (tachypnea).  Gray or bluish discoloration (cyanosis) of the skin, especially in your fingers, toes, or lips.  Fatigue.  Weight loss.  Frequent infections or episodes when breathing symptoms become much worse (exacerbations).  Chest tightness. How is this diagnosed? Your health care provider will take a medical history and perform a physical examination to diagnose COPD. Additional tests for COPD may include:  Lung (pulmonary) function tests.  Chest X-ray.  CT scan.  Blood tests. How is this treated? Treatment for COPD may include:  Inhaler and nebulizer medicines. These help manage the symptoms of COPD and make your breathing more comfortable.  Supplemental oxygen. Supplemental oxygen is only helpful if you have a low oxygen level in your blood.  Exercise and physical activity. These are beneficial for nearly all people with COPD.  Lung surgery or transplant.  Nutrition therapy to gain weight, if you are underweight.  Pulmonary rehabilitation. This may involve working with a team of health care providers and specialists, such as respiratory, occupational, and physical  therapists. Follow these instructions at home:  Take all medicines (inhaled or pills) as directed by your health care provider.  Avoid over-the-counter medicines or cough syrups that dry up your airway (such as antihistamines) and slow down the elimination of secretions unless instructed otherwise by your health care provider.  If you are a smoker, the most important thing that you can do is stop smoking. Continuing to smoke will cause further lung damage and breathing trouble. Ask your health care provider for help with quitting smoking. He or she can direct you to community resources or hospitals that provide support.  Avoid exposure to irritants such as smoke, chemicals, and fumes that aggravate your breathing.  Use oxygen therapy and pulmonary rehabilitation if directed by your health care provider. If you require home oxygen therapy, ask your health care provider whether you should purchase a pulse oximeter to measure your oxygen level at home.  Avoid contact with individuals who have a contagious illness.  Avoid extreme temperature and humidity changes.  Eat healthy foods. Eating smaller, more frequent meals and resting before meals may help you maintain your strength.  Stay active, but balance activity with periods of rest. Exercise and physical activity will help you maintain your ability to do things you want to do.  Preventing infection and hospitalization is very important when you have COPD. Make sure to receive all the vaccines your health care provider recommends, especially the pneumococcal and influenza vaccines. Ask your health care provider whether you need a pneumonia vaccine.  Learn and use relaxation techniques to manage stress.  Learn and use controlled breathing techniques as directed by your health care provider. Controlled breathing techniques include: 1. Pursed lip breathing. Start by breathing in (inhaling)   through your nose for 1 second. Then, purse your lips as  if you were going to whistle and breathe out (exhale) through the pursed lips for 2 seconds. 2. Diaphragmatic breathing. Start by putting one hand on your abdomen just above your waist. Inhale slowly through your nose. The hand on your abdomen should move out. Then purse your lips and exhale slowly. You should be able to feel the hand on your abdomen moving in as you exhale.  Learn and use controlled coughing to clear mucus from your lungs. Controlled coughing is a series of short, progressive coughs. The steps of controlled coughing are: 1. Lean your head slightly forward. 2. Breathe in deeply using diaphragmatic breathing. 3. Try to hold your breath for 3 seconds. 4. Keep your mouth slightly open while coughing twice. 5. Spit any mucus out into a tissue. 6. Rest and repeat the steps once or twice as needed. Contact a health care provider if:  You are coughing up more mucus than usual.  There is a change in the color or thickness of your mucus.  Your breathing is more labored than usual.  Your breathing is faster than usual. Get help right away if:  You have shortness of breath while you are resting.  You have shortness of breath that prevents you from:  Being able to talk.  Performing your usual physical activities.  You have chest pain lasting longer than 5 minutes.  Your skin color is more cyanotic than usual.  You measure low oxygen saturations for longer than 5 minutes with a pulse oximeter. This information is not intended to replace advice given to you by your health care provider. Make sure you discuss any questions you have with your health care provider. Document Released: 11/11/2004 Document Revised: 07/10/2015 Document Reviewed: 09/28/2012 Elsevier Interactive Patient Education  2017 [employer]  

## 2016-11-11 NOTE — Assessment & Plan Note (Signed)
Well controlled Continue lasix and diltiazem Avoid hypotension

## 2016-11-11 NOTE — Assessment & Plan Note (Signed)
NSR today Continue Dilt and Dig Check digoxin level

## 2016-11-11 NOTE — Assessment & Plan Note (Signed)
Improved s/p Keflex Advised patient to avoid picking at skin Continue to apply mupirocin ointment to open wounds that patient continues to pick open

## 2016-11-11 NOTE — Assessment & Plan Note (Signed)
Controlled Not on PPI

## 2016-11-11 NOTE — Assessment & Plan Note (Signed)
Unclear etiology Hemoglobin has downtrended since 05/2016 Will check CBC w diff, B12, folate, iron panel Patient denies active bleeding

## 2016-11-11 NOTE — Assessment & Plan Note (Signed)
Not currently on medications Check TSH and free T4 today

## 2016-11-11 NOTE — Assessment & Plan Note (Signed)
Chronic venous insufficiency No signs of cellulitis No pulmonary crackles Continue lasix

## 2016-11-11 NOTE — Assessment & Plan Note (Signed)
Well controlled Great that patient has quit smoking Albuterol to use prn No need for controller medication at this time

## 2016-11-11 NOTE — Assessment & Plan Note (Signed)
Treated by neurology Didn't tolerate propranolol due to hypotension Continue to f/u with neurology

## 2016-11-11 NOTE — Assessment & Plan Note (Signed)
Behavioral issues seem to be well controlled currently Continue to follow with neurology Continue aricept and seroquel

## 2016-11-11 NOTE — Progress Notes (Addendum)
Patient: Patricia Foley Female    DOB: 02/08/1932   83 y.o.   MRN: [id number] Visit Date: 11/11/2016  Today's Provider: Lavon Paganini, MD   Chief Complaint  Patient presents with  . Establish Care   Subjective:    HPI   Establish Care Ms. Gerald presents to establish care. She has a H/O A-Fib, COPD, dementia. Pt is accompanied by her niece today, who is providing history. Pt was being seen by [facility], but niece was not happy with her care there. She has had cellulitis 3 times this past year, and was hospitalized once for cellulitis of her left eye. Her PCP at [facility] was "unconcenred" per niece.   Last week - swelling, redness of R sided jaw. Noticed by [name]. Seen at [facility]. Diagnosed with cellulitis. Trying to keep it covered.  Took course of Keflex - finished yesterday.  Would like it looked at today.    Leg swelling - chronic problem. Oftensits with legs hanging down.  Has h/o chronic venous insufficiency.  Did get cellulitis in 05/2016 and was hospitalized.  Taking lasix for leg swelling. No PMH of CHF  H/o hypothyroidism: Not currently on any medications. No recent TSH available  HTN: On diltiazem for afib and lasix for leg swelling.  BP seems well controlled on home measurements.  No CP, SOB.  No medication SEs.  Difficulty hearing: no hearing aids  COPD: Quit smoking in February 2018.  Family told her that cigarettes are not made anymore.  She does not take any controller meds.  Her wheezing has improved since quitting smoking.   Alzheimers with behavioral disturbance: Followed by Dr. Manuella Ghazi, Neurology.  Taking Seroquel for behavioral disturbance and aricept  Tremors: followed by neurology.  Didn't tolerate propranolol due to hypotension.  Not currently taking any treatment.  Anemia: patient and family did not know that she was anemic.  She denies any rectal, vaginal bleeding, hematuria, hemoptosis.  She does have external hemorrhoids.  Allergies  Allergen Reactions    . Sulfa Antibiotics Hives  . Coumadin [Warfarin] Rash     Current Outpatient Prescriptions:  .  acetaminophen (TYLENOL) 325 MG tablet, Take 2 tablets (650 mg total) by mouth every 6 (six) hours as needed for mild pain (or Fever >/= 101)., Disp: 30 tablet, Rfl: 0 .  aspirin EC 81 MG tablet, Take 81 mg by mouth daily., Disp: , Rfl:  .  Calcium Carbonate-Vitamin D (CALCIUM 600+D) 600-200 MG-UNIT TABS, Take 1 tablet by mouth daily., Disp: , Rfl:  .  digoxin (LANOXIN) 0.125 MG tablet, Take 0.125 mg by mouth daily., Disp: , Rfl:  .  diltiazem (TIAZAC) 300 MG 24 hr capsule, Take 300 mg by mouth daily., Disp: , Rfl:  .  donepezil (ARICEPT) 10 MG tablet, Take 20 mg by mouth daily. , Disp: , Rfl:  .  fluticasone (FLONASE) 50 MCG/ACT nasal spray, Place 1 spray into the nose daily., Disp: , Rfl:  .  furosemide (LASIX) 40 MG tablet, Take 40 mg by mouth daily. , Disp: , Rfl:  .  hydrocortisone (PROCTOSOL HC) 2.5 % rectal cream, Apply 1 application topically 3 (three) times daily., Disp: , Rfl:  .  mupirocin ointment (BACTROBAN) 2 %, Apply 1 application topically 2 (two) times daily., Disp: , Rfl: 1 .  naproxen (NAPROSYN) 500 MG tablet, Take 1 tablet (500 mg total) by mouth 2 (two) times daily with a meal. Ankle pain swelling, Disp: 5 tablet, Rfl: 0 .  potassium chloride  SA (K-DUR,KLOR-CON) 20 MEQ tablet, Take 20 mEq by mouth daily. , Disp: , Rfl:  .  QUEtiapine (SEROQUEL) 25 MG tablet, Take 1 tablet by mouth daily., Disp: , Rfl:  .  triamcinolone cream (KENALOG) 0.1 %, Apply 1 application topically 2 (two) times daily. Due to facial picking, Disp: , Rfl:   Review of Systems  Constitutional: Negative.   HENT: Positive for hearing loss. Negative for congestion, dental problem, drooling, ear discharge, ear pain, facial swelling, mouth sores, nosebleeds, postnasal drip, rhinorrhea, sinus pain, sinus pressure, sneezing, sore throat, tinnitus, trouble swallowing and voice change.   Eyes: Negative.    Respiratory: Negative.   Cardiovascular: Positive for leg swelling. Negative for chest pain and palpitations.  Gastrointestinal: Positive for anal bleeding. Negative for abdominal distention, abdominal pain, blood in stool, constipation, diarrhea, nausea, rectal pain and vomiting.  Endocrine: Negative.   Genitourinary: Negative.   Musculoskeletal: Negative.   Skin: Negative.   Allergic/Immunologic: Negative.   Neurological: Negative.   Hematological: Negative for adenopathy. Bruises/bleeds easily.  Psychiatric/Behavioral: Negative.    Past Medical History:  Diagnosis Date  . Anemia   . Arthritis   . Atrial fibrillation ([place])   . Circulation problem   . COPD (chronic obstructive pulmonary disease) ([place])   . Cough    CHRONIC  . Dementia   . GERD (gastroesophageal reflux disease)   . HOH (hard of hearing)   . Hypertension   . Hypothyroidism   . Tremors of nervous system   . Venous insufficiency    Past Surgical History:  Procedure Laterality Date  . ABDOMINAL HYSTERECTOMY     benign indication  . CATARACT EXTRACTION W/PHACO Right 05/04/2016   Procedure: CATARACT EXTRACTION PHACO AND INTRAOCULAR LENS PLACEMENT (IOC);  Surgeon: Birder Robson, MD;  Location: [facility] ORS;  Service: Ophthalmology;  Laterality: Right;  [place] 3:02.7 AP% 23.1 CDE 42.15 Fluid pack lot # [id number]  . CATARACT EXTRACTION W/PHACO Left 06/29/2016   Procedure: CATARACT EXTRACTION PHACO AND INTRAOCULAR LENS PLACEMENT (IOC);  Surgeon: Birder Robson, MD;  Location: [facility] ORS;  Service: Ophthalmology;  Laterality: Left;  [place] 2:53.3 AP% 27.7 CDE 48.16 Fluid Pack Lot # [id number]   Family History  Problem Relation Age of Onset  . Heart disease Father   . Heart disease Mother   . Heart disease Sister   . Heart disease Brother   . Dementia Brother   . Heart attack Son 27  . Dementia Brother     Social History  Substance Use Topics  . Smoking status: Former Smoker    Packs/day: 1.00    Years: 50.00     Quit date: 04/14/2016  . Smokeless tobacco: Never Used  . Alcohol use No   Objective:   BP (!) 118/54 (BP Location: Left Arm, Patient Position: Sitting, Cuff Size: Normal)   Pulse 64   Temp 98.4 F (36.9 C) (Oral)   Resp 16   Ht 4\' 11"  (1.499 m)   Wt 134 lb (60.8 kg)   BMI 27.06 kg/m  Vitals:   11/11/16 1030  BP: (!) 118/54  Pulse: 64  Resp: 16  Temp: 98.4 F (36.9 C)  TempSrc: Oral  Weight: 134 lb (60.8 kg)  Height: 4\' 11"  (1.499 m)     Physical Exam  Constitutional: She appears well-developed and well-nourished. No distress.  HENT:  Head: Normocephalic and atraumatic.  Right Ear: External ear normal.  Left Ear: External ear normal.  Nose: Nose normal.  Mouth/Throat: Oropharynx is clear and moist. No oropharyngeal  exudate.  Eyes: Conjunctivae are normal. No scleral icterus.  Neck: Neck supple. No thyromegaly present.  Cardiovascular: Normal rate, regular rhythm and intact distal pulses.   No murmur heard. Pulmonary/Chest: Effort normal and breath sounds normal. No respiratory distress. She has no wheezes. She has no rales.  Abdominal: Soft. Bowel sounds are normal. She exhibits no distension. There is no tenderness. There is no rebound and no guarding.  Musculoskeletal: She exhibits edema (1-2+). She exhibits no deformity.  +chronic venous stasis changes  Lymphadenopathy:    She has no cervical adenopathy.  Neurological: She is alert.  Skin: Skin is warm and dry. No rash noted.  Psychiatric: She has a normal mood and affect. Her behavior is normal.  Vitals reviewed.       Assessment & Plan:      Problem List Items Addressed This Visit      Cardiovascular and Mediastinum   Hypertension    Well controlled Continue lasix and diltiazem Avoid hypotension      Relevant Orders   Comprehensive metabolic panel   Venous insufficiency    Chronic venous insufficiency No signs of cellulitis No pulmonary crackles Continue lasix      Atrial fibrillation (HCC)     NSR today Continue Dilt and Dig Check digoxin level      Relevant Orders   Digoxin level   Comprehensive metabolic panel     Respiratory   COPD (chronic obstructive pulmonary disease) (HCC)    Well controlled Great that patient has quit smoking Albuterol to use prn No need for controller medication at this time      Relevant Medications   albuterol (PROVENTIL HFA;VENTOLIN HFA) 108 (90 Base) MCG/ACT inhaler     Digestive   GERD (gastroesophageal reflux disease)    Controlled Not on PPI        Endocrine   Hypothyroidism    Not currently on medications Check TSH and free T4 today      Relevant Orders   TSH   T4, free     Nervous and Auditory   Dementia    Behavioral issues seem to be well controlled currently Continue to follow with neurology Continue aricept and seroquel      HOH (hard of hearing)     Musculoskeletal and Integument   Arthritis     Other   Cellulitis of face    Improved s/p Keflex Advised patient to avoid picking at skin Continue to apply mupirocin ointment to open wounds that patient continues to pick open      Tremors of nervous system    Treated by neurology Didn't tolerate propranolol due to hypotension Continue to f/u with neurology      Anemia    Unclear etiology Hemoglobin has downtrended since 05/2016 Will check CBC w diff, B12, folate, iron panel Patient denies active bleeding      Relevant Orders   CBC w/Diff/Platelet   Fe+TIBC+Fer   Folate   B12    Other Visit Diagnoses    Encounter to establish care    -  Primary   Person living in residential institution       Relevant Orders   Quantiferon tb gold assay (blood)        Of note, patient's family wishes to avoid drastic measures where possible, given Alzheimers and limited life expectancies  Addressed extensive list of chronic and acute medical problems today requiring extensive time in counseling and coordination of care.  Over half of this 45 minute  visit were spent in counseling and coordinating care of multiple medical problems.     The entirety of the information documented in the History of Present Illness, Review of Systems and Physical Exam were personally obtained by me. Portions of this information were initially documented by Raquel Sarna Ratchford, CMA and reviewed by me for thoroughness and accuracy.    Lavon Paganini, MD  [facility]

## 2016-11-13 LAB — IRON,TIBC AND FERRITIN PANEL
%SAT: 10 % (calc) — ABNORMAL LOW (ref 11–50)
Ferritin: 16 ng/mL — ABNORMAL LOW (ref 20–288)
Iron: 35 ug/dL — ABNORMAL LOW (ref 45–160)
TIBC: 365 mcg/dL (calc) (ref 250–450)

## 2016-11-13 LAB — CBC WITH DIFFERENTIAL/PLATELET
BASOS ABS: 68 {cells}/uL (ref 0–200)
Basophils Relative: 0.8 %
Eosinophils Absolute: 570 cells/uL — ABNORMAL HIGH (ref 15–500)
Eosinophils Relative: 6.7 %
HCT: 33.1 % — ABNORMAL LOW (ref 35.0–45.0)
HEMOGLOBIN: 10.9 g/dL — AB (ref 11.7–15.5)
LYMPHS ABS: 1564 {cells}/uL (ref 850–3900)
MCH: 28.8 pg (ref 27.0–33.0)
MCHC: 32.9 g/dL (ref 32.0–36.0)
MCV: 87.3 fL (ref 80.0–100.0)
MPV: 10.6 fL (ref 7.5–12.5)
Monocytes Relative: 8.9 %
NEUTROS PCT: 65.2 %
Neutro Abs: 5542 cells/uL (ref 1500–7800)
Platelets: 279 10*3/uL (ref 140–400)
RBC: 3.79 10*6/uL — ABNORMAL LOW (ref 3.80–5.10)
RDW: 12.7 % (ref 11.0–15.0)
TOTAL LYMPHOCYTE: 18.4 %
WBC: 8.5 10*3/uL (ref 3.8–10.8)
WBCMIX: 757 {cells}/uL (ref 200–950)

## 2016-11-13 LAB — COMPREHENSIVE METABOLIC PANEL
AG RATIO: 1.3 (calc) (ref 1.0–2.5)
ALT: 11 U/L (ref 6–29)
AST: 20 U/L (ref 10–35)
Albumin: 4.1 g/dL (ref 3.6–5.1)
Alkaline phosphatase (APISO): 88 U/L (ref 33–130)
BUN: 20 mg/dL (ref 7–25)
CO2: 32 mmol/L (ref 20–32)
Calcium: 9.3 mg/dL (ref 8.6–10.4)
Chloride: 100 mmol/L (ref 98–110)
Creat: 0.78 mg/dL (ref 0.60–0.88)
GLUCOSE: 82 mg/dL (ref 65–99)
Globulin: 3.2 g/dL (calc) (ref 1.9–3.7)
Potassium: 4.2 mmol/L (ref 3.5–5.3)
SODIUM: 142 mmol/L (ref 135–146)
TOTAL PROTEIN: 7.3 g/dL (ref 6.1–8.1)
Total Bilirubin: 0.5 mg/dL (ref 0.2–1.2)

## 2016-11-13 LAB — FOLATE: FOLATE: 12.2 ng/mL

## 2016-11-13 LAB — VITAMIN B12: VITAMIN B 12: 515 pg/mL (ref 200–1100)

## 2016-11-13 LAB — QUANTIFERON TB GOLD ASSAY (BLOOD)
Mitogen-Nil: >10 IU/mL
QUANTIFERON TB AG MINUS NIL: 0.03 [IU]/mL
QUANTIFERON(R)-TB GOLD: NEGATIVE
Quantiferon Nil Value: 0.07 IU/mL

## 2016-11-13 LAB — T4, FREE: Free T4: 1.1 ng/dL (ref 0.8–1.8)

## 2016-11-13 LAB — TSH: TSH: 0.13 mIU/L — ABNORMAL LOW (ref 0.40–4.50)

## 2016-11-13 LAB — DIGOXIN LEVEL: Digoxin Level: 1 mcg/L (ref 0.8–2.0)

## 2016-11-15 ENCOUNTER — Telehealth: Payer: Self-pay

## 2016-11-15 NOTE — Telephone Encounter (Signed)
-----   Message from Virginia Crews, MD sent at 11/15/2016  8:29 AM EDT ----- Normal digoxin level, no change to this medication.  Hemoglobin is better than it was 1 month ago.  Some of this is chronic, but iron levels are slightly low.  Would like to start iron supplement (ferrous sulfate 325mg  daily).  This can cause constipation, so it is best taken with a stool softener like Colace.  Blood counts are otherwise normal.  Normal kidney function, liver function, electrolytes.  TSH, hormone that stimulates the thyroid to work is slightly low, but the thyroid is producing a normal amount of hormone.  This does not require treatment at this time.  TB test negative.  Folate and B12 normal.  Please ask if any of this needs to be sent to facility. Thanks!  Virginia Crews, MD, MPH [facility] 11/15/2016 8:29 AM

## 2016-11-15 NOTE — Telephone Encounter (Signed)
Pt's niece advised. This will need to be sent to [facility]. Will call facility for fax number.

## 2016-11-22 ENCOUNTER — Other Ambulatory Visit: Payer: Self-pay | Admitting: Family Medicine

## 2016-11-22 MED ORDER — POTASSIUM CHLORIDE CRYS ER 20 MEQ PO TBCR: 20.0000 meq | EXTENDED_RELEASE_TABLET | Freq: Every day | ORAL | 3 refills | Status: DC

## 2016-11-22 MED ORDER — NAPROXEN 500 MG PO TABS: 500.0000 mg | ORAL_TABLET | Freq: Two times a day (BID) | ORAL | 3 refills | Status: DC

## 2016-11-22 NOTE — Telephone Encounter (Signed)
LOV/established 11/11/2016.

## 2016-11-22 NOTE — Telephone Encounter (Signed)
Pt contacted office for refill request on the following medications:  naproxen (NAPROSYN) 500 MG tablet   potassium chloride SA (K-DUR,KLOR-CON) 20 MEQ tablet  [facility]  [employer].  [phone]

## 2016-12-02 ENCOUNTER — Telehealth: Payer: Self-pay | Admitting: Family Medicine

## 2016-12-02 NOTE — Telephone Encounter (Signed)
ROI ([facility]) faxed to [facility] for records.

## 2016-12-17 ENCOUNTER — Other Ambulatory Visit: Payer: Self-pay | Admitting: Family Medicine

## 2016-12-17 ENCOUNTER — Other Ambulatory Visit: Payer: Self-pay

## 2016-12-17 MED ORDER — ACETAMINOPHEN 325 MG PO TABS: 650.0000 mg | ORAL_TABLET | Freq: Four times a day (QID) | ORAL | 1 refills | Status: DC | PRN

## 2016-12-17 MED ORDER — ASPIRIN EC 81 MG PO TBEC: 81.0000 mg | DELAYED_RELEASE_TABLET | Freq: Every day | ORAL | 1 refills | Status: DC

## 2016-12-17 MED ORDER — ACETAMINOPHEN 325 MG PO TABS: 650.0000 mg | ORAL_TABLET | Freq: Four times a day (QID) | ORAL | 0 refills | Status: DC | PRN

## 2016-12-17 NOTE — Telephone Encounter (Signed)
Pt's niece [name] contacted office for refill request on the following medications:  1. aspirin EC 81 MG tablet 2. acetaminophen (TYLENOL) 325 MG tablet   Belinda stated that [facility] advised they have an OTC program and pt can get those medications cheaper but they need an Rx before they can send them to the pt. Belinda request that we send in the Rx to [facility]. Please advise. Thanks TNP

## 2016-12-17 NOTE — Telephone Encounter (Signed)
Please review for Dr Anda Kraft, RMA

## 2016-12-22 ENCOUNTER — Other Ambulatory Visit: Payer: Self-pay

## 2016-12-22 MED ORDER — NAPROXEN 500 MG PO TABS: 500.0000 mg | ORAL_TABLET | Freq: Two times a day (BID) | ORAL | 3 refills | Status: DC

## 2016-12-22 MED ORDER — DIGOXIN 125 MCG PO TABS: 0.1250 mg | ORAL_TABLET | Freq: Every day | ORAL | 3 refills | Status: DC

## 2016-12-22 NOTE — Telephone Encounter (Signed)
Patient's niece [name] is requesting a refill on Digoxin 0.125 mg and Naproxen 500 mg be sent to [facility] mail order. CB# [phone]

## 2017-02-10 ENCOUNTER — Ambulatory Visit (INDEPENDENT_AMBULATORY_CARE_PROVIDER_SITE_OTHER): Payer: Medicare HMO | Admitting: Family Medicine

## 2017-02-10 ENCOUNTER — Encounter: Payer: Self-pay | Admitting: Family Medicine

## 2017-02-10 VITALS — BP 124/60 | HR 60 | Resp 16 | Wt 139.0 lb

## 2017-02-10 DIAGNOSIS — Z23 Encounter for immunization: Secondary | ICD-10-CM

## 2017-02-10 DIAGNOSIS — R350 Frequency of micturition: Secondary | ICD-10-CM | POA: Diagnosis not present

## 2017-02-10 DIAGNOSIS — I10 Essential (primary) hypertension: Secondary | ICD-10-CM | POA: Diagnosis not present

## 2017-02-10 DIAGNOSIS — D509 Iron deficiency anemia, unspecified: Secondary | ICD-10-CM | POA: Diagnosis not present

## 2017-02-10 DIAGNOSIS — F0281 Dementia in other diseases classified elsewhere with behavioral disturbance: Secondary | ICD-10-CM

## 2017-02-10 DIAGNOSIS — J449 Chronic obstructive pulmonary disease, unspecified: Secondary | ICD-10-CM | POA: Diagnosis not present

## 2017-02-10 DIAGNOSIS — G301 Alzheimer's disease with late onset: Secondary | ICD-10-CM | POA: Diagnosis not present

## 2017-02-10 LAB — POCT URINALYSIS DIPSTICK
Appearance: NORMAL
BILIRUBIN UA: NEGATIVE
Blood, UA: NEGATIVE
Glucose, UA: NEGATIVE
KETONES UA: NEGATIVE
Leukocytes, UA: NEGATIVE
NITRITE UA: NEGATIVE
Odor: NORMAL
PH UA: 6.5 (ref 5.0–8.0)
Protein, UA: NEGATIVE
SPEC GRAV UA: 1.01 (ref 1.010–1.025)
UROBILINOGEN UA: 0.2 U/dL

## 2017-02-10 NOTE — Progress Notes (Signed)
Patient: Patricia Foley Female    DOB: Oct 17, 1931   83 y.o.   MRN: [id number] Visit Date: 02/10/2017  Today's Provider: Lavon Paganini, MD   I, Martha Clan, CMA, am acting as scribe for Lavon Paganini, MD.  Chief Complaint  Patient presents with  . Hypertension  . COPD  . Anemia   Subjective:    HPI Patient has dementia, so majority of history is obtained from niece      Hypertension, follow-up:  BP Readings from Last 3 Encounters:  02/10/17 124/60  11/11/16 (!) 118/54  10/02/16 (!) 136/49    She was last seen for hypertension 3 months ago.  BP at that visit was 118/54. Management since that visit includes continuing medications. She reports good compliance with treatment. She is not having side effects.  She is experiencing lower extremity edema.  Patient denies chest pain, chest pressure/discomfort, claudication, dyspnea, exertional chest pressure/discomfort, fatigue, irregular heart beat, near-syncope, orthopnea, palpitations and syncope.   Cardiovascular risk factors include advanced age (older than 41 for men, 46 for women) and hypertension.    Weight trend: stable Wt Readings from Last 3 Encounters:  02/10/17 139 lb (63 kg)  11/11/16 134 lb (60.8 kg)  10/01/16 133 lb 14.4 oz (60.7 kg)    ------------------------------------------------------------------------  COPD, Follow up:  She was last seen for this 3 months ago. Changes made include continuing Albuterol PRN.   She reports good compliance with treatment. She is not having side effects.  she is using rescue inhaler. Only when she is sick. She IS experiencing none. She is NOT experiencing dyspnea, cough, wheezing, fatigue, weight increase, weight loss, chills, fever, increased sputum or colored sputum. she report breathing is Unchanged.  Pulmonary Functions Testing Results:  No results found for: FEV1, FVC, FEV1FVC,  TLC  ------------------------------------------------------------------------   Follow up for Anemia The patient was last seen for this 3 months ago. Changes made at last visit include adding OTC ferrous sulfate 325 mg.  She reports excellent compliance with treatment. She is not having side effects.   Lab Results  Component Value Date   WBC 8.5 11/11/2016   HGB 10.9 (L) 11/11/2016   HCT 33.1 (L) 11/11/2016   MCV 87.3 11/11/2016   PLT 279 11/11/2016    ------------------------------------------------------------------------------------ Urinary frequency - niece has noticed when taking her out from [place] that she is urinating frequently.  They deny dysuria, malodorous urine, fevers, abd pain, incontinence.   Allergies  Allergen Reactions  . Sulfa Antibiotics Hives  . Coumadin [Warfarin] Rash     Current Outpatient Medications:  .  acetaminophen (TYLENOL) 325 MG tablet, Take 2 tablets (650 mg total) by mouth every 6 (six) hours as needed for mild pain (or Fever >/= 101)., Disp: 90 tablet, Rfl: 1 .  albuterol (PROVENTIL HFA;VENTOLIN HFA) 108 (90 Base) MCG/ACT inhaler, Inhale 2 puffs into the lungs every 6 (six) hours as needed for wheezing or shortness of breath., Disp: 1 Inhaler, Rfl: 3 .  aspirin EC 81 MG tablet, Take 1 tablet (81 mg total) by mouth daily., Disp: 90 tablet, Rfl: 1 .  Calcium Carbonate-Vitamin D (CALCIUM 600+D) 600-200 MG-UNIT TABS, Take 1 tablet by mouth daily., Disp: , Rfl:  .  digoxin (LANOXIN) 0.125 MG tablet, Take 1 tablet (0.125 mg total) daily by mouth., Disp: 90 tablet, Rfl: 3 .  diltiazem (TIAZAC) 300 MG 24 hr capsule, Take 300 mg by mouth daily., Disp: , Rfl:  .  donepezil (ARICEPT) 10 MG  tablet, Take 20 mg by mouth daily. , Disp: , Rfl:  .  fluticasone (FLONASE) 50 MCG/ACT nasal spray, Place 1 spray into the nose daily., Disp: , Rfl:  .  furosemide (LASIX) 40 MG tablet, Take 40 mg by mouth daily. , Disp: , Rfl:  .  hydrocortisone (PROCTOSOL HC)  2.5 % rectal cream, Apply 1 application topically 3 (three) times daily., Disp: , Rfl:  .  mupirocin ointment (BACTROBAN) 2 %, Apply 1 application topically 2 (two) times daily., Disp: , Rfl: 1 .  naproxen (NAPROSYN) 500 MG tablet, Take 1 tablet (500 mg total) 2 (two) times daily with a meal by mouth. Ankle pain swelling, Disp: 60 tablet, Rfl: 3 .  potassium chloride SA (K-DUR,KLOR-CON) 20 MEQ tablet, Take 1 tablet (20 mEq total) by mouth daily., Disp: 30 tablet, Rfl: 3 .  QUEtiapine (SEROQUEL) 25 MG tablet, Take 1 tablet by mouth daily., Disp: , Rfl:  .  triamcinolone cream (KENALOG) 0.1 %, Apply 1 application topically 2 (two) times daily. Due to facial picking, Disp: , Rfl:   Review of Systems  Constitutional: Negative.   HENT: Negative.   Respiratory: Negative.   Cardiovascular: Negative.   Gastrointestinal: Negative.   Genitourinary: Positive for frequency. Negative for decreased urine volume, difficulty urinating, dysuria, flank pain, hematuria, pelvic pain, urgency, vaginal bleeding, vaginal discharge and vaginal pain.  Musculoskeletal: Negative.   Neurological: Negative.   Psychiatric/Behavioral: Negative.     Social History   Tobacco Use  . Smoking status: Former Smoker    Packs/day: 1.00    Years: 50.00    Pack years: 50.00    Types: Cigarettes    Last attempt to quit: 04/14/2016    Years since quitting: 0.8  . Smokeless tobacco: Never Used  Substance Use Topics  . Alcohol use: No   Objective:   BP 124/60 (BP Location: Right Arm, Patient Position: Sitting, Cuff Size: Normal)   Pulse 60   Resp 16   Wt 139 lb (63 kg)   SpO2 95%   BMI 28.07 kg/m  Vitals:   02/10/17 1126  BP: 124/60  Pulse: 60  Resp: 16  SpO2: 95%  Weight: 139 lb (63 kg)     Physical Exam  Constitutional: She appears well-developed and well-nourished. No distress.  Eyes: Conjunctivae are normal.  Neck: Neck supple.  Cardiovascular: Normal rate, regular rhythm, normal heart sounds and intact  distal pulses.  No murmur heard. Pulmonary/Chest: Effort normal and breath sounds normal. No respiratory distress. She has no wheezes. She has no rales.  Abdominal: Soft. She exhibits no distension. There is no tenderness. There is no rebound and no guarding.  Musculoskeletal: She exhibits no edema.  +thoracic kyphosis, slow shuffling gait, strength intact in UE/LEs  Lymphadenopathy:    She has no cervical adenopathy.  Neurological: She is alert.  Skin: Skin is warm and dry. No rash noted.  Psychiatric: She has a normal mood and affect. Her behavior is normal.  Vitals reviewed.   Results for orders placed or performed in visit on 02/10/17  POCT urinalysis dipstick  Result Value Ref Range   Color, UA light yellow    Clarity, UA clear    Glucose, UA Negative    Bilirubin, UA Negative    Ketones, UA Negative    Spec Grav, UA 1.010 1.010 - 1.025   Blood, UA Negative    pH, UA 6.5 5.0 - 8.0   Protein, UA Negative    Urobilinogen, UA 0.2 0.2 or 1.0  E.U./dL   Nitrite, UA Negative    Leukocytes, UA Negative Negative   Appearance normal    Odor normal        Assessment & Plan:      Problem List Items Addressed This Visit      Cardiovascular and Mediastinum   Hypertension    Well controlled Continue current meds Avoid hypotension        Respiratory   COPD (chronic obstructive pulmonary disease) (HCC)    Well controlled Albuterol prn No controller meds currently Continue to avoid cigarettes (patient believes they have stopped making them)      Relevant Medications   fexofenadine (MUCINEX ALLERGY) 180 MG tablet   Phenylephrine-APAP-Guaifenesin (MUCINEX FAST-MAX) 10-650-400 MG/20ML LIQD     Nervous and Auditory   Dementia    Followed by neurology  Behavioral concerns currently well controlled by report Continue aricept and seroquel Suspect plays a role in urinary frequency as patient forgets when she last went to bathroom - suggested timed voids at [facility]  paperwork for assisted living completed and given to niece today        Other   Anemia    Hemoglobin had improved some on last check Some component of anemia of chronic disease, but also iron deficiency anemia Denies any active bleeding Given somewhat advanced age and dementia, doubt full workup for cause of iron deficiency would be in patient's best interest Continue supplement with stool softener Recheck CBC and iron panel Continue to discuss with patient and family      Relevant Medications   ferrous sulfate 325 (65 FE) MG tablet   Other Relevant Orders   CBC   Fe+TIBC+Fer   Urinary frequency - Primary    Suspect this is related to behavior No evidence of UTI Consider timed voids Return precautions discussed      Relevant Orders   POCT urinalysis dipstick (Completed)    Other Visit Diagnoses    Need for pneumococcal vaccination       Relevant Orders   Pneumococcal conjugate vaccine 13-valent IM (Completed)      Return in about 3 months (around 05/11/2017) for chronic disease.      The entirety of the information documented in the History of Present Illness, Review of Systems and Physical Exam were personally obtained by me. Portions of this information were initially documented by Raquel Sarna Ratchford, CMA and reviewed by me for thoroughness and accuracy.    Virginia Crews, MD, MPH [facility] 02/10/2017 2:17 PM

## 2017-02-10 NOTE — Patient Instructions (Signed)
Anemia Anemia is a condition in which you do not have enough red blood cells or hemoglobin. Hemoglobin is a substance in red blood cells that carries oxygen. When you do not have enough red blood cells or hemoglobin (are anemic), your body cannot get enough oxygen and your organs may not work properly. As a result, you may feel very tired or have other problems. What are the causes? Common causes of anemia include:  Excessive bleeding. Anemia can be caused by excessive bleeding inside or outside the body, including bleeding from the intestine or from periods in women.  Poor nutrition.  Long-lasting (chronic) kidney, thyroid, and liver disease.  Bone marrow disorders.  Cancer and treatments for cancer.  HIV (human immunodeficiency virus) and AIDS (acquired immunodeficiency syndrome).  Treatments for HIV and AIDS.  Spleen problems.  Blood disorders.  Infections, medicines, and autoimmune disorders that destroy red blood cells.  What are the signs or symptoms? Symptoms of this condition include:  Minor weakness.  Dizziness.  Headache.  Feeling heartbeats that are irregular or faster than normal (palpitations).  Shortness of breath, especially with exercise.  Paleness.  Cold sensitivity.  Indigestion.  Nausea.  Difficulty sleeping.  Difficulty concentrating.  Symptoms may occur suddenly or develop slowly. If your anemia is mild, you may not have symptoms. How is this diagnosed? This condition is diagnosed based on:  Blood tests.  Your medical history.  A physical exam.  Bone marrow biopsy.  Your health care provider may also check your stool (feces) for blood and may do additional testing to look for the cause of your bleeding. You may also have other tests, including:  Imaging tests, such as a CT scan or MRI.  Endoscopy.  Colonoscopy.  How is this treated? Treatment for this condition depends on the cause. If you continue to lose a lot of blood,  you may need to be treated at a hospital. Treatment may include:  Taking supplements of iron, vitamin B12, or folic acid.  Taking a hormone medicine (erythropoietin) that can help to stimulate red blood cell growth.  Having a blood transfusion. This may be needed if you lose a lot of blood.  Making changes to your diet.  Having surgery to remove your spleen.  Follow these instructions at home:  Take over-the-counter and prescription medicines only as told by your health care provider.  Take supplements only as told by your health care provider.  Follow any diet instructions that you were given.  Keep all follow-up visits as told by your health care provider. This is important. Contact a health care provider if:  You develop new bleeding anywhere in the body. Get help right away if:  You are very weak.  You are short of breath.  You have pain in your abdomen or chest.  You are dizzy or feel faint.  You have trouble concentrating.  You have bloody or black, tarry stools.  You vomit repeatedly or you vomit up blood. Summary  Anemia is a condition in which you do not have enough red blood cells or enough of a substance in your red blood cells that carries oxygen (hemoglobin).  Symptoms may occur suddenly or develop slowly.  If your anemia is mild, you may not have symptoms.  This condition is diagnosed with blood tests as well as a medical history and physical exam. Other tests may be needed.  Treatment for this condition depends on the cause of the anemia. This information is not intended to replace advice   given to you by your health care provider. Make sure you discuss any questions you have with your health care provider. Document Released: 03/11/2004 Document Revised: 03/05/2016 Document Reviewed: 03/05/2016 Elsevier Interactive Patient Education  [employer].

## 2017-02-10 NOTE — Assessment & Plan Note (Signed)
Followed by neurology  Behavioral concerns currently well controlled by report Continue aricept and seroquel Suspect plays a role in urinary frequency as patient forgets when she last went to bathroom - suggested timed voids at [facility] paperwork for assisted living completed and given to niece today

## 2017-02-10 NOTE — Assessment & Plan Note (Signed)
Well controlled Albuterol prn No controller meds currently Continue to avoid cigarettes (patient believes they have stopped making them)

## 2017-02-10 NOTE — Assessment & Plan Note (Signed)
Suspect this is related to behavior No evidence of UTI Consider timed voids Return precautions discussed

## 2017-02-10 NOTE — Assessment & Plan Note (Signed)
Well controlled Continue current meds Avoid hypotension

## 2017-02-10 NOTE — Assessment & Plan Note (Signed)
Hemoglobin had improved some on last check Some component of anemia of chronic disease, but also iron deficiency anemia Denies any active bleeding Given somewhat advanced age and dementia, doubt full workup for cause of iron deficiency would be in patient's best interest Continue supplement with stool softener Recheck CBC and iron panel Continue to discuss with patient and family

## 2017-02-11 ENCOUNTER — Telehealth: Payer: Self-pay

## 2017-02-11 LAB — CBC
HCT: 35.7 % (ref 35.0–45.0)
HEMOGLOBIN: 11.9 g/dL (ref 11.7–15.5)
MCH: 28.8 pg (ref 27.0–33.0)
MCHC: 33.3 g/dL (ref 32.0–36.0)
MCV: 86.4 fL (ref 80.0–100.0)
MPV: 11.3 fL (ref 7.5–12.5)
Platelets: 219 10*3/uL (ref 140–400)
RBC: 4.13 10*6/uL (ref 3.80–5.10)
RDW: 13.4 % (ref 11.0–15.0)
WBC: 9.2 10*3/uL (ref 3.8–10.8)

## 2017-02-11 LAB — IRON,TIBC AND FERRITIN PANEL
%SAT: 24 % (calc) (ref 11–50)
FERRITIN: 26 ng/mL (ref 20–288)
IRON: 80 ug/dL (ref 45–160)
TIBC: 334 mcg/dL (calc) (ref 250–450)

## 2017-02-11 NOTE — Telephone Encounter (Signed)
-----   Message from Virginia Crews, MD sent at 02/11/2017  9:22 AM EST ----- Iron levels and hemoglobin are now normal.  Continue current supplement dose.  Virginia Crews, MD, MPH [facility] 02/11/2017 9:22 AM

## 2017-02-11 NOTE — Telephone Encounter (Signed)
Pt's niece was advised of results and acknowledges understanding.

## 2017-02-14 ENCOUNTER — Other Ambulatory Visit: Payer: Self-pay | Admitting: Family Medicine

## 2017-02-14 DIAGNOSIS — Z961 Presence of intraocular lens: Secondary | ICD-10-CM | POA: Diagnosis not present

## 2017-02-14 MED ORDER — NAPROXEN 500 MG PO TABS: 500.0000 mg | ORAL_TABLET | Freq: Two times a day (BID) | ORAL | 3 refills | Status: DC

## 2017-02-14 NOTE — Telephone Encounter (Signed)
[facility] pharmacy faxed a refill request for a 90-days supply for the following medication. Thanks CC ° °naproxen (NAPROSYN) 500 MG tablet  ° °

## 2017-02-14 NOTE — Telephone Encounter (Signed)
Please review

## 2017-03-23 ENCOUNTER — Other Ambulatory Visit: Payer: Self-pay | Admitting: Family Medicine

## 2017-03-24 NOTE — Telephone Encounter (Signed)
Kidney function WNL at last check.

## 2017-03-30 ENCOUNTER — Other Ambulatory Visit: Payer: Self-pay | Admitting: Family Medicine

## 2017-05-09 ENCOUNTER — Encounter: Payer: Self-pay | Admitting: Family Medicine

## 2017-05-11 ENCOUNTER — Ambulatory Visit (INDEPENDENT_AMBULATORY_CARE_PROVIDER_SITE_OTHER): Payer: Medicare HMO | Admitting: Family Medicine

## 2017-05-11 ENCOUNTER — Encounter: Payer: Self-pay | Admitting: Family Medicine

## 2017-05-11 VITALS — BP 160/72 | HR 56 | Temp 97.9°F | Resp 16 | Wt 140.0 lb

## 2017-05-11 DIAGNOSIS — E039 Hypothyroidism, unspecified: Secondary | ICD-10-CM

## 2017-05-11 DIAGNOSIS — F0281 Dementia in other diseases classified elsewhere with behavioral disturbance: Secondary | ICD-10-CM | POA: Diagnosis not present

## 2017-05-11 DIAGNOSIS — G301 Alzheimer's disease with late onset: Secondary | ICD-10-CM

## 2017-05-11 DIAGNOSIS — I872 Venous insufficiency (chronic) (peripheral): Secondary | ICD-10-CM | POA: Diagnosis not present

## 2017-05-11 DIAGNOSIS — L84 Corns and callosities: Secondary | ICD-10-CM | POA: Diagnosis not present

## 2017-05-11 DIAGNOSIS — R946 Abnormal results of thyroid function studies: Secondary | ICD-10-CM | POA: Diagnosis not present

## 2017-05-11 DIAGNOSIS — I10 Essential (primary) hypertension: Secondary | ICD-10-CM

## 2017-05-11 DIAGNOSIS — T148XXA Other injury of unspecified body region, initial encounter: Secondary | ICD-10-CM | POA: Diagnosis not present

## 2017-05-11 DIAGNOSIS — F0280 Dementia in other diseases classified elsewhere without behavioral disturbance: Secondary | ICD-10-CM | POA: Diagnosis not present

## 2017-05-11 DIAGNOSIS — J449 Chronic obstructive pulmonary disease, unspecified: Secondary | ICD-10-CM | POA: Diagnosis not present

## 2017-05-11 NOTE — Assessment & Plan Note (Signed)
Referral to podiatry for management of toenails and calluses on bilateral feet

## 2017-05-11 NOTE — Assessment & Plan Note (Signed)
Given the nonhealing nature and appearance of lesion on the patient's forehead, concerned that this could represent a squamous cell carcinoma Referral for dermatology

## 2017-05-11 NOTE — Assessment & Plan Note (Signed)
Patient with chronic venous stasis and insufficiency of bilateral legs She has chronic venous stasis changes of her skin in her lower calves No features concerning for cellulitis Explained that sometimes wounds will drain clear fluid or we due to the excess fluid in her legs Keep area clean and dry and monitor for signs of infection Discussed importance of elevation and compression stockings As she has no other signs of fluid overload, should maintain her current daily dose of Lasix

## 2017-05-11 NOTE — Assessment & Plan Note (Signed)
Above goal today, but consistently well controlled at patient's SNF No changes to medications made today Continue to monitor outside of the office We will follow-up in 3 months, but if blood pressure is elevated at the nursing facility, we can consider medication changes sooner than that

## 2017-05-11 NOTE — Assessment & Plan Note (Signed)
Well-controlled Albuterol as needed No controller meds currently Continue to avoid cigarettes- patient currently believes they have stopped making cigarettes

## 2017-05-11 NOTE — Assessment & Plan Note (Signed)
Asymptomatic and not currently on medications Check TSH today

## 2017-05-11 NOTE — Progress Notes (Signed)
Patient: Patricia Foley Female    DOB: 1931-07-05   84 y.o.   MRN: [id number] Visit Date: 05/11/2017  Today's Provider: Lavon Paganini, MD   I, Martha Clan, CMA, am acting as scribe for Lavon Paganini, MD.  Chief Complaint  Patient presents with  . Hypertension  . COPD   Subjective:    HPI      Hypertension, follow-up:  BP Readings from Last 3 Encounters:  05/11/17 (!) 160/72  02/10/17 124/60  11/11/16 (!) 118/54    She was last seen for hypertension 3 months ago.  BP at that visit was 124/60. Management since that visit includes no chnages. She reports good compliance with treatment. She is not having side effects.  She is experiencing lower extremity edema. Her legs are also weeping fluids. She also walks around quite a bit, which is prohibiting her to elevate her feet. Patient denies chest pain, dyspnea and palpitations.   Cardiovascular risk factors include advanced age (older than 57 for men, 2 for women) and hypertension.    Weight trend: stable Wt Readings from Last 3 Encounters:  05/11/17 140 lb (63.5 kg)  02/10/17 139 lb (63 kg)  11/11/16 134 lb (60.8 kg)   ------------------------------------------------------------------------  COPD, Follow up:  She was last seen for this 3 months ago. Changes made include no changes.   She reports good compliance with treatment. She is not having side effects.  she is/isnot using rescue inhaler. When needed. She IS experiencing none. She is NOT experiencing dyspnea, cough, weight increase, weight loss, increased sputum or colored sputum. she report breathing is stable.  ------------------------------------------------------------------------  Pt also has wound on her forehead that family would like examined.  Patricia Foley states it is been present for about a month.  Initially it was thought this was an area that she was picking at, but it seems to never heal.  It does bleed when it is  scratched.  He thinks it looks like a blood blister.  They have been keeping a Band-Aid over it to keep the patient from picking at it.  Family is also concerned about bilateral leg swelling in calves and ankles.  This is a chronic problem for the patient, but seems to be worse in the last month.  They are also concerned that there are wounds on the right leg that are weeping clear fluid.  The patient scratches these areas frequently causing him to break back open.  She does not wear compression stockings.  She sits in a chair all day long and even sleeps in a chair often.   Allergies  Allergen Reactions  . Sulfa Antibiotics Hives  . Coumadin [Warfarin] Rash     Current Outpatient Medications:  .  acetaminophen (TYLENOL) 325 MG tablet, Take 2 tablets (650 mg total) by mouth every 6 (six) hours as needed for mild pain (or Fever >/= 101)., Disp: 90 tablet, Rfl: 1 .  albuterol (PROVENTIL HFA;VENTOLIN HFA) 108 (90 Base) MCG/ACT inhaler, Inhale 2 puffs into the lungs every 6 (six) hours as needed for wheezing or shortness of breath., Disp: 1 Inhaler, Rfl: 3 .  aspirin EC 81 MG tablet, Take 1 tablet (81 mg total) by mouth daily., Disp: 90 tablet, Rfl: 1 .  Calcium Carbonate-Vitamin D (CALCIUM 600+D) 600-200 MG-UNIT TABS, Take 1 tablet by mouth daily., Disp: , Rfl:  .  digoxin (LANOXIN) 0.125 MG tablet, Take 1 tablet (0.125 mg total) daily by mouth., Disp: 90 tablet, Rfl: 3 .  diltiazem (TIAZAC) 300 MG 24 hr capsule, Take 300 mg by mouth daily., Disp: , Rfl:  .  Docusate Sodium (COLACE PO), Take 50 mg by mouth., Disp: , Rfl:  .  donepezil (ARICEPT) 10 MG tablet, Take 20 mg by mouth daily. , Disp: , Rfl:  .  ferrous sulfate 325 (65 FE) MG tablet, Take 325 mg by mouth daily with breakfast., Disp: , Rfl:  .  fluticasone (FLONASE) 50 MCG/ACT nasal spray, Place 1 spray into the nose daily., Disp: , Rfl:  .  furosemide (LASIX) 40 MG tablet, Take 40 mg by mouth daily. , Disp: , Rfl:  .  hydrocortisone  (PROCTOSOL HC) 2.5 % rectal cream, Apply 1 application topically 3 (three) times daily., Disp: , Rfl:  .  mupirocin ointment (BACTROBAN) 2 %, Apply 1 application topically 2 (two) times daily., Disp: , Rfl: 1 .  naproxen (NAPROSYN) 500 MG tablet, TAKE 1 TABLET (500 MG TOTAL) BY MOUTH 2 (TWO) TIMES DAILY WITH A MEAL. ANKLE PAIN SWELLING, Disp: 60 tablet, Rfl: 1 .  Phenylephrine-APAP-Guaifenesin (MUCINEX FAST-MAX) 10-650-400 MG/20ML LIQD, Take by mouth., Disp: , Rfl:  .  potassium chloride SA (K-DUR,KLOR-CON) 20 MEQ tablet, Take 1 tablet (20 mEq total) by mouth daily., Disp: 30 tablet, Rfl: 3 .  QUEtiapine (SEROQUEL) 25 MG tablet, Take 1 tablet by mouth daily., Disp: , Rfl:  .  fexofenadine (MUCINEX ALLERGY) 180 MG tablet, Take 180 mg by mouth daily., Disp: , Rfl:  .  triamcinolone cream (KENALOG) 0.1 %, Apply 1 application topically 2 (two) times daily. Due to facial picking, Disp: , Rfl:   Review of Systems  Constitutional: Negative.   Respiratory: Negative.  Negative for cough and shortness of breath.   Cardiovascular: Positive for leg swelling (and "weeping fluid"). Negative for chest pain and palpitations.  Gastrointestinal: Negative.   Genitourinary: Positive for frequency. Negative for decreased urine volume, difficulty urinating, dysuria, hematuria and urgency.  Musculoskeletal: Negative.   Skin: Positive for wound. Negative for color change and rash.  Neurological: Negative.   Psychiatric/Behavioral: Negative.     Social History   Tobacco Use  . Smoking status: Former Smoker    Packs/day: 1.00    Years: 50.00    Pack years: 50.00    Types: Cigarettes    Last attempt to quit: 04/14/2016    Years since quitting: 1.0  . Smokeless tobacco: Never Used  Substance Use Topics  . Alcohol use: No   Objective:   BP (!) 160/72 (BP Location: Left Arm, Patient Position: Sitting, Cuff Size: Normal)   Pulse (!) 56   Temp 97.9 F (36.6 C) (Oral)   Resp 16   Wt 140 lb (63.5 kg)   SpO2  95%   BMI 28.28 kg/m  Vitals:   05/11/17 1101 05/11/17 1201  BP: (!) 160/70 (!) 160/72  Pulse: (!) 56   Resp: 16   Temp: 97.9 F (36.6 C)   TempSrc: Oral   SpO2: 95%   Weight: 140 lb (63.5 kg)      Physical Exam  Constitutional: She appears well-developed and well-nourished. No distress.  HENT:  Head: Normocephalic and atraumatic.  Mouth/Throat: Oropharynx is clear and moist. No oropharyngeal exudate.  Eyes: Conjunctivae are normal. No scleral icterus.  Neck: Neck supple. No thyromegaly present.  Cardiovascular: Normal rate, regular rhythm, normal heart sounds and intact distal pulses.  No murmur heard. Pulmonary/Chest: Effort normal and breath sounds normal. No respiratory distress. She has no wheezes. She has no rales.  Musculoskeletal:  She exhibits edema. She exhibits no deformity.  Lymphadenopathy:    She has no cervical adenopathy.  Neurological: She is alert.  Skin: Skin is warm and dry.  Chronic venous stasis changes of bilateral lower legs.  No cellulitis changes.  No tenderness to palpation.  Various excoriations noted.  One wound on upper lateral calf draining clear fluid. Callus without ulcer on base of right great toe Erythematous lesion of R forehead  Psychiatric: She has a normal mood and affect. Her behavior is normal.  Vitals reviewed.     Assessment & Plan:   Problem List Items Addressed This Visit      Cardiovascular and Mediastinum   Chronic venous insufficiency - Primary    Patient with chronic venous stasis and insufficiency of bilateral legs She has chronic venous stasis changes of her skin in her lower calves No features concerning for cellulitis Explained that sometimes wounds will drain clear fluid or we due to the excess fluid in her legs Keep area clean and dry and monitor for signs of infection Discussed importance of elevation and compression stockings As she has no other signs of fluid overload, should maintain her current daily dose of  Lasix      Hypertension    Above goal today, but consistently well controlled at patient's SNF No changes to medications made today Continue to monitor outside of the office We will follow-up in 3 months, but if blood pressure is elevated at the nursing facility, we can consider medication changes sooner than that      Relevant Orders   Basic Metabolic Panel (BMET)     Respiratory   COPD (chronic obstructive pulmonary disease) (HCC)    Well-controlled Albuterol as needed No controller meds currently Continue to avoid cigarettes- patient currently believes they have stopped making cigarettes        Endocrine   Hypothyroidism    Asymptomatic and not currently on medications Check TSH today      Relevant Orders   TSH     Nervous and Auditory   Dementia    Followed by neurology Continue Aricept and Seroquel Discussed with the family that this likely plays a part in her urinary frequency and again suggested timed voids Letter stating that the patient has dementia and needs to be in the locked unit at her skilled nursing facility was given to family        Musculoskeletal and Integument   Callus of foot    Referral to podiatry for management of toenails and calluses on bilateral feet      Relevant Orders   Ambulatory referral to Podiatry     Other   Nonhealing nonsurgical wound    Given the nonhealing nature and appearance of lesion on the patient's forehead, concerned that this could represent a squamous cell carcinoma Referral for dermatology      Relevant Orders   Ambulatory referral to Dermatology      Return in about 3 months (around 08/11/2017).   The entirety of the information documented in the History of Present Illness, Review of Systems and Physical Exam were personally obtained by me. Portions of this information were initially documented by Raquel Sarna Ratchford, CMA and reviewed by me for thoroughness and accuracy.    Virginia Crews, MD,  MPH [facility] 05/11/2017 3:15 PM

## 2017-05-11 NOTE — Patient Instructions (Signed)

## 2017-05-11 NOTE — Assessment & Plan Note (Signed)
Followed by neurology Continue Aricept and Seroquel Discussed with the family that this likely plays a part in her urinary frequency and again suggested timed voids Letter stating that the patient has dementia and needs to be in the locked unit at her skilled nursing facility was given to family

## 2017-05-12 ENCOUNTER — Telehealth: Payer: Self-pay

## 2017-05-12 LAB — BASIC METABOLIC PANEL
BUN / CREAT RATIO: 17 (ref 12–28)
BUN: 12 mg/dL (ref 8–27)
CO2: 29 mmol/L (ref 20–29)
CREATININE: 0.71 mg/dL (ref 0.57–1.00)
Calcium: 10.1 mg/dL (ref 8.7–10.3)
Chloride: 88 mmol/L — ABNORMAL LOW (ref 96–106)
GFR calc Af Amer: 90 mL/min/{1.73_m2} (ref >59)
GFR, EST NON AFRICAN AMERICAN: 78 mL/min/{1.73_m2} (ref >59)
Glucose: 84 mg/dL (ref 65–99)
Potassium: 4.6 mmol/L (ref 3.5–5.2)
SODIUM: 131 mmol/L — AB (ref 134–144)

## 2017-05-12 LAB — TSH: TSH: 0.296 u[IU]/mL — ABNORMAL LOW (ref 0.450–4.500)

## 2017-05-12 NOTE — Telephone Encounter (Signed)
-----   Message from Virginia Crews, MD sent at 05/12/2017  9:10 AM EDT ----- Hormone that stimulates thyroid is low.  We will run a confirmatory test (Vivian Neuwirth can you ask lab to add on free T4)  Normal kidney function and electrolytes, except sodium is slightly low.  Often in older women, this is associated with a diet that is very low in sodium as they tend to pick at small amounts of food.  Try to encourage patient to add some sodium to her diet.  We will recheck at her next visit

## 2017-05-12 NOTE — Telephone Encounter (Signed)
Belinda advised of results.

## 2017-05-12 NOTE — Telephone Encounter (Signed)
Lmtcb. Asked Langley Gauss, phlebotomist, to add on lab.

## 2017-05-13 LAB — SPECIMEN STATUS REPORT

## 2017-05-13 LAB — T4, FREE: FREE T4: 1.16 ng/dL (ref 0.82–1.77)

## 2017-05-16 ENCOUNTER — Telehealth: Payer: Self-pay

## 2017-05-16 NOTE — Telephone Encounter (Signed)
lmtcb

## 2017-05-16 NOTE — Telephone Encounter (Signed)
Belinda advised.

## 2017-05-16 NOTE — Telephone Encounter (Signed)
-----   Message from Virginia Crews, MD sent at 05/16/2017  8:56 AM EDT ----- Despite abnormal levels of hormone that stimulates the thyroid gland.  The thyroid is functioning normal and thyroid hormones are normal.  No treatment necessary.  We will continue to monitor

## 2017-05-26 DIAGNOSIS — B351 Tinea unguium: Secondary | ICD-10-CM | POA: Diagnosis not present

## 2017-05-26 DIAGNOSIS — M79675 Pain in left toe(s): Secondary | ICD-10-CM | POA: Diagnosis not present

## 2017-05-26 DIAGNOSIS — M79674 Pain in right toe(s): Secondary | ICD-10-CM | POA: Diagnosis not present

## 2017-06-14 ENCOUNTER — Telehealth: Payer: Self-pay | Admitting: Family Medicine

## 2017-06-14 NOTE — Telephone Encounter (Signed)
[facility] pharmacy faxed a refill request for the following medication. Thanks CC  mupirocin ointment (BACTROBAN) 2 %

## 2017-06-15 MED ORDER — DILTIAZEM HCL ER BEADS 300 MG PO CP24: 300.0000 mg | ORAL_CAPSULE | Freq: Every day | ORAL | 3 refills | Status: DC

## 2017-06-15 MED ORDER — MUPIROCIN 2 % EX OINT: 1.0000 "application " | TOPICAL_OINTMENT | Freq: Two times a day (BID) | CUTANEOUS | 1 refills | Status: DC

## 2017-06-15 NOTE — Telephone Encounter (Signed)
Rx sent. Also received request for diltiazem refill and this was also sent  Bacigalupo, Dionne Bucy, MD, MPH [facility] 06/15/2017 8:23 AM

## 2017-06-23 DIAGNOSIS — D2262 Melanocytic nevi of left upper limb, including shoulder: Secondary | ICD-10-CM | POA: Diagnosis not present

## 2017-06-23 DIAGNOSIS — D1801 Hemangioma of skin and subcutaneous tissue: Secondary | ICD-10-CM | POA: Diagnosis not present

## 2017-06-23 DIAGNOSIS — D2271 Melanocytic nevi of right lower limb, including hip: Secondary | ICD-10-CM | POA: Diagnosis not present

## 2017-06-23 DIAGNOSIS — L980 Pyogenic granuloma: Secondary | ICD-10-CM | POA: Diagnosis not present

## 2017-06-23 DIAGNOSIS — L0101 Non-bullous impetigo: Secondary | ICD-10-CM | POA: Diagnosis not present

## 2017-06-23 DIAGNOSIS — D225 Melanocytic nevi of trunk: Secondary | ICD-10-CM | POA: Diagnosis not present

## 2017-06-23 DIAGNOSIS — L821 Other seborrheic keratosis: Secondary | ICD-10-CM | POA: Diagnosis not present

## 2017-06-23 DIAGNOSIS — D2261 Melanocytic nevi of right upper limb, including shoulder: Secondary | ICD-10-CM | POA: Diagnosis not present

## 2017-06-23 DIAGNOSIS — D2272 Melanocytic nevi of left lower limb, including hip: Secondary | ICD-10-CM | POA: Diagnosis not present

## 2017-06-25 ENCOUNTER — Other Ambulatory Visit: Payer: Self-pay | Admitting: Physician Assistant

## 2017-08-02 ENCOUNTER — Ambulatory Visit (INDEPENDENT_AMBULATORY_CARE_PROVIDER_SITE_OTHER): Payer: Medicare HMO | Admitting: Family Medicine

## 2017-08-02 ENCOUNTER — Encounter: Payer: Self-pay | Admitting: Family Medicine

## 2017-08-02 VITALS — BP 156/68 | HR 63 | Temp 97.8°F | Resp 16 | Wt 145.0 lb

## 2017-08-02 DIAGNOSIS — R0789 Other chest pain: Secondary | ICD-10-CM | POA: Diagnosis not present

## 2017-08-02 DIAGNOSIS — J449 Chronic obstructive pulmonary disease, unspecified: Secondary | ICD-10-CM | POA: Diagnosis not present

## 2017-08-02 DIAGNOSIS — F0281 Dementia in other diseases classified elsewhere with behavioral disturbance: Secondary | ICD-10-CM | POA: Diagnosis not present

## 2017-08-02 DIAGNOSIS — E039 Hypothyroidism, unspecified: Secondary | ICD-10-CM | POA: Diagnosis not present

## 2017-08-02 DIAGNOSIS — I10 Essential (primary) hypertension: Secondary | ICD-10-CM | POA: Diagnosis not present

## 2017-08-02 DIAGNOSIS — G301 Alzheimer's disease with late onset: Secondary | ICD-10-CM | POA: Diagnosis not present

## 2017-08-02 DIAGNOSIS — F0280 Dementia in other diseases classified elsewhere without behavioral disturbance: Secondary | ICD-10-CM | POA: Diagnosis not present

## 2017-08-02 MED ORDER — LISINOPRIL 5 MG PO TABS: 5.0000 mg | ORAL_TABLET | Freq: Every day | ORAL | 3 refills | Status: DC

## 2017-08-02 NOTE — Progress Notes (Signed)
Patient: Patricia Foley Female    DOB: 31-Aug-1931   84 y.o.   MRN: [id number] Visit Date: 08/02/2017  Today's Provider: Lavon Paganini, MD   I, Martha Clan, CMA, am acting as scribe for Lavon Paganini, MD.  Chief Complaint  Patient presents with  . Hypertension  . COPD   Subjective:    HPI  Pt is c/o left breast pain, tender to palpation. Family member states a hammer fell off a ladder and hit her on the chest, and this has been bothering her every since. The hammer incident occurred several years ago.     Hypertension, follow-up:  BP Readings from Last 3 Encounters:  08/02/17 (!) 156/68  05/11/17 (!) 160/72  02/10/17 124/60    She was last seen for hypertension 3 months ago.  BP at that visit was 160/72. Management since that visit includes no changes. She reports good compliance with treatment. She is not having side effects.  She is adherent to low salt diet.   She is experiencing none.  Patient denies chest pain, chest pressure/discomfort, claudication, dyspnea, exertional chest pressure/discomfort, fatigue, irregular heart beat, lower extremity edema, near-syncope, orthopnea, palpitations and syncope.   Cardiovascular risk factors include advanced age (older than 52 for men, 35 for women) and hypertension.  Use of agents associated with hypertension: none.     Weight trend: increasing steadily Wt Readings from Last 3 Encounters:  08/02/17 145 lb (65.8 kg)  05/11/17 140 lb (63.5 kg)  02/10/17 139 lb (63 kg)   ------------------------------------------------------------------------  COPD, Follow up:  She was last seen for this 3 months ago. Changes made include continuing medications.   She reports good compliance with treatment. She is not having side effects.  She IS experiencing weight increase. She is NOT experiencing dyspnea, cough, wheezing, fatigue, weight loss, chills, fever, increased sputum or colored sputum. she report breathing  is stable.  Pulmonary Functions Testing Results:  No results found for: FEV1, FVC, FEV1FVC, TLC  ------------------------------------------------------------------------    Allergies  Allergen Reactions  . Sulfa Antibiotics Hives  . Coumadin [Warfarin] Rash     Current Outpatient Medications:  .  acetaminophen (TYLENOL) 325 MG tablet, Take 2 tablets (650 mg total) by mouth every 6 (six) hours as needed for mild pain (or Fever >/= 101)., Disp: 90 tablet, Rfl: 1 .  albuterol (PROVENTIL HFA;VENTOLIN HFA) 108 (90 Base) MCG/ACT inhaler, Inhale 2 puffs into the lungs every 6 (six) hours as needed for wheezing or shortness of breath., Disp: 1 Inhaler, Rfl: 3 .  aspirin 81 MG EC tablet, TAKE 1 TABLET BY MOUTH EVERY DAY, Disp: 90 tablet, Rfl: 1 .  Calcium Carbonate-Vitamin D (CALCIUM 600+D) 600-200 MG-UNIT TABS, Take 1 tablet by mouth daily., Disp: , Rfl:  .  digoxin (LANOXIN) 0.125 MG tablet, Take 1 tablet (0.125 mg total) daily by mouth., Disp: 90 tablet, Rfl: 3 .  diltiazem (TIAZAC) 300 MG 24 hr capsule, Take 1 capsule (300 mg total) by mouth daily., Disp: 90 capsule, Rfl: 3 .  Docusate Sodium (COLACE PO), Take 50 mg by mouth., Disp: , Rfl:  .  donepezil (ARICEPT) 10 MG tablet, Take 20 mg by mouth daily. , Disp: , Rfl:  .  ferrous sulfate 325 (65 FE) MG tablet, Take 325 mg by mouth daily with breakfast., Disp: , Rfl:  .  fexofenadine (MUCINEX ALLERGY) 180 MG tablet, Take 180 mg by mouth daily., Disp: , Rfl:  .  fluticasone (FLONASE) 50 MCG/ACT nasal spray, Place 1  spray into the nose daily., Disp: , Rfl:  .  furosemide (LASIX) 40 MG tablet, Take 40 mg by mouth daily. , Disp: , Rfl:  .  hydrocortisone (PROCTOSOL HC) 2.5 % rectal cream, Apply 1 application topically 3 (three) times daily., Disp: , Rfl:  .  naproxen (NAPROSYN) 500 MG tablet, TAKE 1 TABLET (500 MG TOTAL) BY MOUTH 2 (TWO) TIMES DAILY WITH A MEAL. ANKLE PAIN SWELLING, Disp: 60 tablet, Rfl: 1 .  Phenylephrine-APAP-Guaifenesin  (MUCINEX FAST-MAX) 10-650-400 MG/20ML LIQD, Take by mouth., Disp: , Rfl:  .  potassium chloride SA (K-DUR,KLOR-CON) 20 MEQ tablet, Take 1 tablet (20 mEq total) by mouth daily., Disp: 30 tablet, Rfl: 3 .  QUEtiapine (SEROQUEL) 25 MG tablet, Take 1 tablet by mouth daily., Disp: , Rfl:  .  mupirocin ointment (BACTROBAN) 2 %, Apply 1 application topically 2 (two) times daily. (Patient not taking: Reported on 08/02/2017), Disp: 22 g, Rfl: 1 .  triamcinolone cream (KENALOG) 0.1 %, Apply 1 application topically 2 (two) times daily. Due to facial picking, Disp: , Rfl:   Review of Systems  Constitutional: Negative for activity change, appetite change, chills, diaphoresis, fatigue, fever and unexpected weight change.  Respiratory: Negative for chest tightness and shortness of breath.   Cardiovascular: Negative for chest pain, palpitations and leg swelling.  Psychiatric/Behavioral: Positive for confusion.    Social History   Tobacco Use  . Smoking status: Former Smoker    Packs/day: 1.00    Years: 50.00    Pack years: 50.00    Types: Cigarettes    Last attempt to quit: 04/14/2016    Years since quitting: 1.3  . Smokeless tobacco: Never Used  Substance Use Topics  . Alcohol use: No   Objective:   BP (!) 156/68 (BP Location: Left Arm, Patient Position: Sitting, Cuff Size: Normal)   Pulse 63   Temp 97.8 F (36.6 C) (Oral)   Resp 16   Wt 145 lb (65.8 kg)   SpO2 93%   BMI 29.29 kg/m  Vitals:   08/02/17 1104  BP: (!) 156/68  Pulse: 63  Resp: 16  Temp: 97.8 F (36.6 C)  TempSrc: Oral  SpO2: 93%  Weight: 145 lb (65.8 kg)     Physical Exam  Constitutional: She appears well-developed and well-nourished. No distress.  HENT:  Head: Normocephalic and atraumatic.  Right Ear: External ear normal.  Left Ear: External ear normal.  Nose: Nose normal.  Mouth/Throat: Oropharynx is clear and moist.  Eyes: Conjunctivae are normal.  Neck: Neck supple. No thyromegaly present.  Cardiovascular:  Normal rate, regular rhythm, normal heart sounds and intact distal pulses.  Pulmonary/Chest: Effort normal and breath sounds normal. No respiratory distress. She has no wheezes. She has no rales. She exhibits tenderness (TTP over L chest wall over top of L breast, no skin changes, no lumps, if patient is distracted, she does not react to palpaiton).  Musculoskeletal: She exhibits no edema.  Lymphadenopathy:    She has no cervical adenopathy.  Neurological: She is alert.  Skin: Skin is warm and dry. Capillary refill takes less than 2 seconds. No rash noted.  Psychiatric: She has a normal mood and affect. Her behavior is normal.  Vitals reviewed.      Assessment & Plan:   Problem List Items Addressed This Visit      Cardiovascular and Mediastinum   Hypertension - Primary    Remains above goal today, as it was at last visit Unclear what her blood pressures are at  SNF currently Continue current medications Start lisinopril 5 mg daily in addition to other medications Goal blood pressure less than 150/90, given age and comorbidities will not try for lower goal Continue to monitor outside of office and call if it is elevated or dropping low We will DC her Mucinex fast max which could be raising her blood pressure if she is using this at the SNF, which her family believes that she is not We will want to check BMP next week since we are starting an ACE inhibitor and I do not think she is taking 1 of these before -need to make sure that potassium is stable      Relevant Medications   lisinopril (PRINIVIL,ZESTRIL) 5 MG tablet   Other Relevant Orders   Basic Metabolic Panel (BMET)     Respiratory   COPD (chronic obstructive pulmonary disease) ([place])    Well-controlled Continue albuterol as needed No controller meds currently Continue to avoid cigarettes-patient currently believes they have stopped making cigarettes        Endocrine   Hypothyroidism    History of Not currently on  medications At last visit, TSH was low, but free T4 was normal Continue to monitor every 6 months or so        Nervous and Auditory   Dementia    Fairly advanced Followed by neurology Continue Aricept and Seroquel Believe that she is in the locked unit at her SNF currently       Other Visit Diagnoses    Left-sided chest wall pain        -Patient with some tenderness to palpation over her left chest wall.  This is only with deep palpation and does not seem to be breast pain.  Her family reports that this has been an ongoing issue for years that she will occasionally remember that a hammer hit her there and complain of pain.  She seems to be distractible from the pain, so I do not suspect any underlying ongoing injury   Return in about 1 month (around 08/30/2017) for BP f/u.   The entirety of the information documented in the History of Present Illness, Review of Systems and Physical Exam were personally obtained by me. Portions of this information were initially documented by Raquel Sarna Ratchford, CMA and reviewed by me for thoroughness and accuracy.    Virginia Crews, MD, MPH [facility] 08/03/2017 9:16 AM

## 2017-08-03 NOTE — Assessment & Plan Note (Signed)
Well-controlled Continue albuterol as needed No controller meds currently Continue to avoid cigarettes-patient currently believes they have stopped making cigarettes

## 2017-08-03 NOTE — Assessment & Plan Note (Signed)
Remains above goal today, as it was at last visit Unclear what her blood pressures are at SNF currently Continue current medications Start lisinopril 5 mg daily in addition to other medications Goal blood pressure less than 150/90, given age and comorbidities will not try for lower goal Continue to monitor outside of office and call if it is elevated or dropping low We will DC her Mucinex fast max which could be raising her blood pressure if she is using this at the SNF, which her family believes that she is not We will want to check BMP next week since we are starting an ACE inhibitor and I do not think she is taking 1 of these before -need to make sure that potassium is stable

## 2017-08-03 NOTE — Assessment & Plan Note (Signed)
Fairly advanced Followed by neurology Continue Aricept and Seroquel Believe that she is in the locked unit at her SNF currently

## 2017-08-03 NOTE — Assessment & Plan Note (Signed)
History of Not currently on medications At last visit, TSH was low, but free T4 was normal Continue to monitor every 6 months or so

## 2017-08-07 IMAGING — US US EXTREM LOW VENOUS*R*
1 series · 13 of 24 positions shown · non-contrast
Comparison: Right ankle radiographs performed earlier today at [DATE]
p.m.

CLINICAL DATA: Subacute onset of right ankle pain and swelling for
2 weeks. Initial encounter.



[Series 1: us extrem low venous*right* · 0.06mm/px · 13 of 38 slices shown]
[im 1/38]
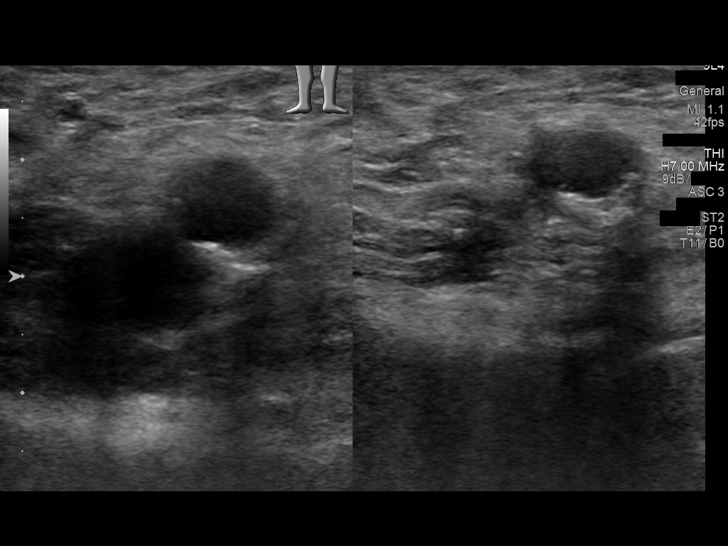
[im 4/38]
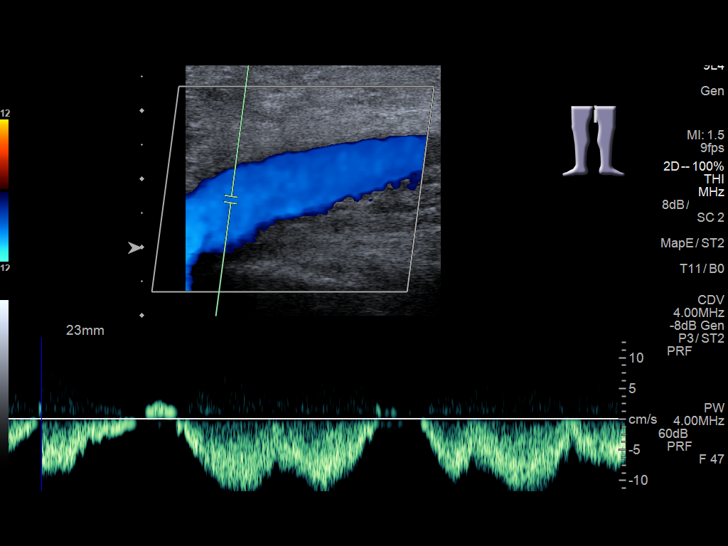
[im 7/38]
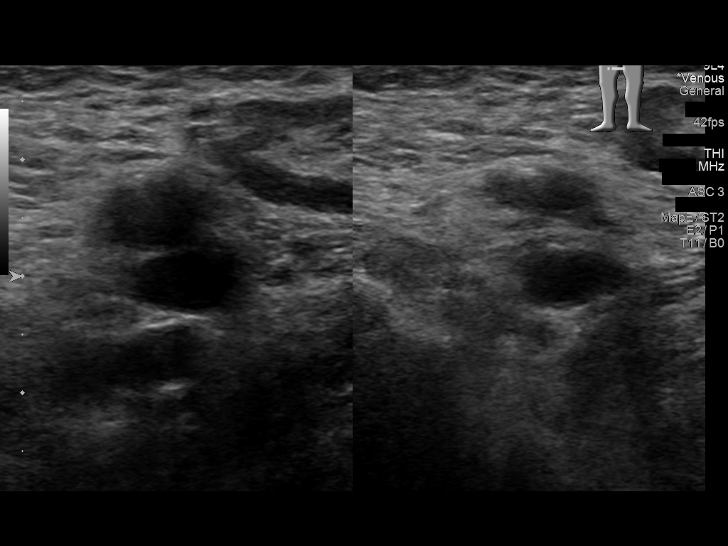
[im 10/38]
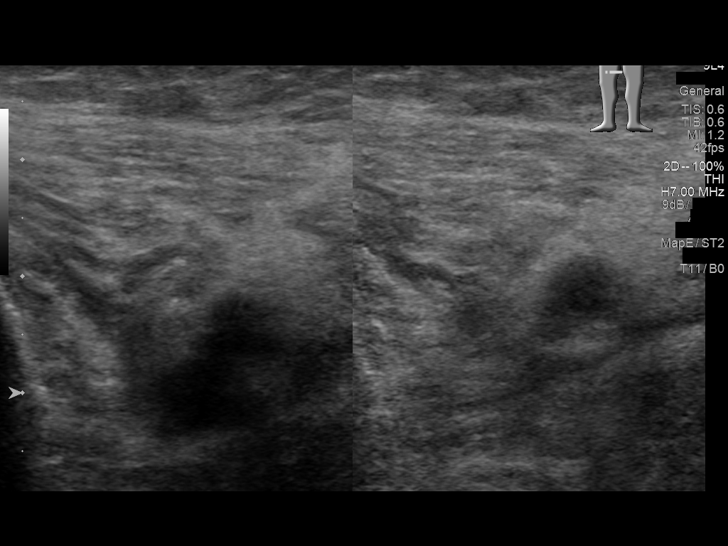
[im 13/38]
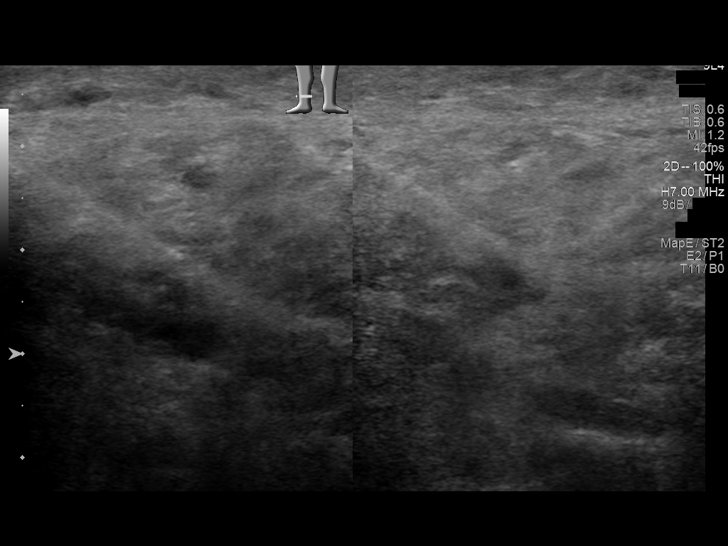
[im 17/38]
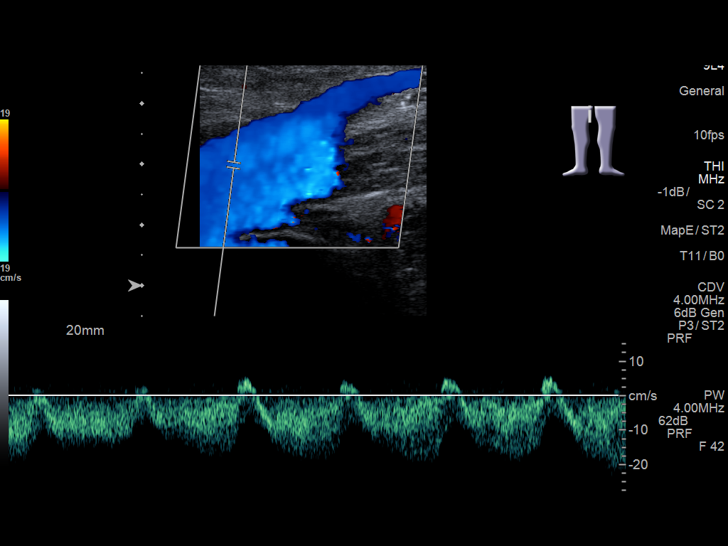
[im 20/38]
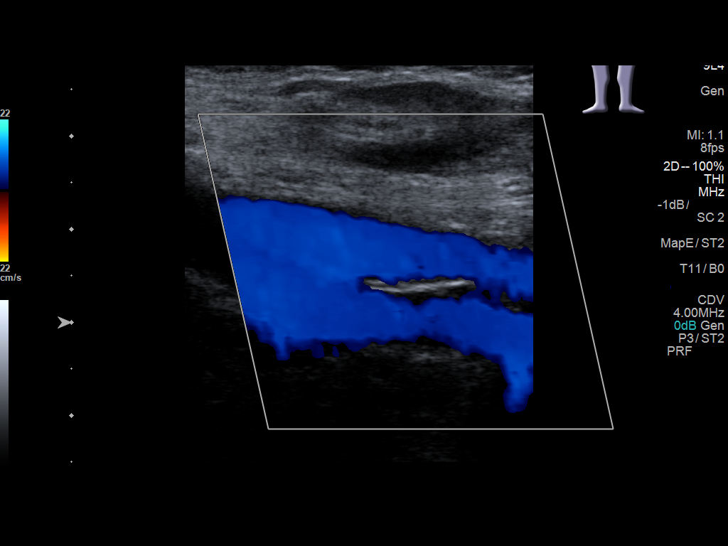
[im 21/38]
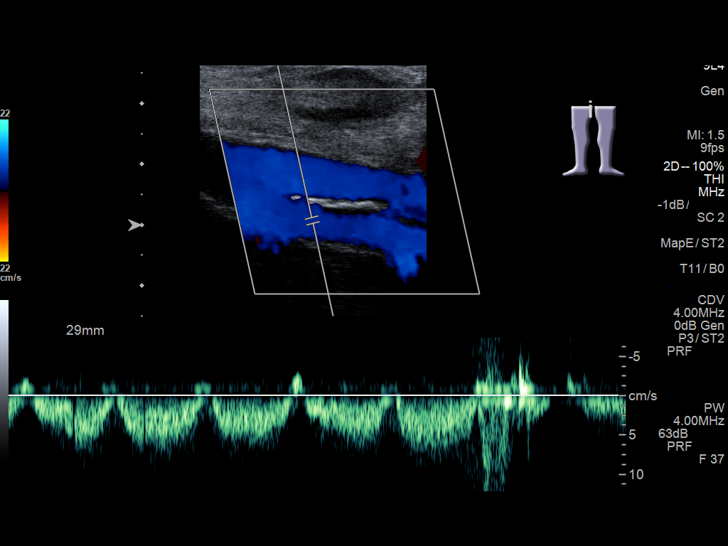
[im 25/38]
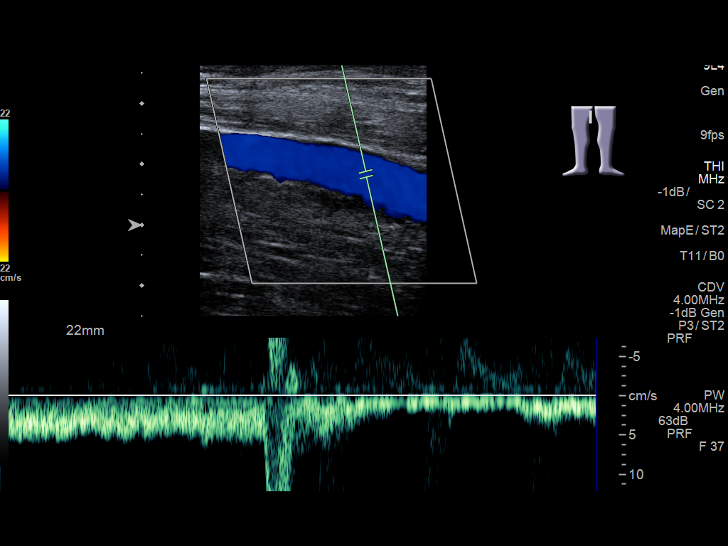
[im 28/38]
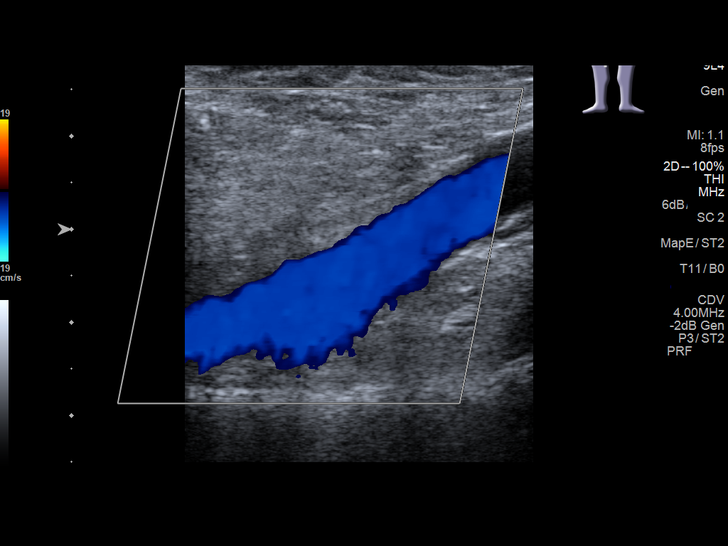
[im 31/38]
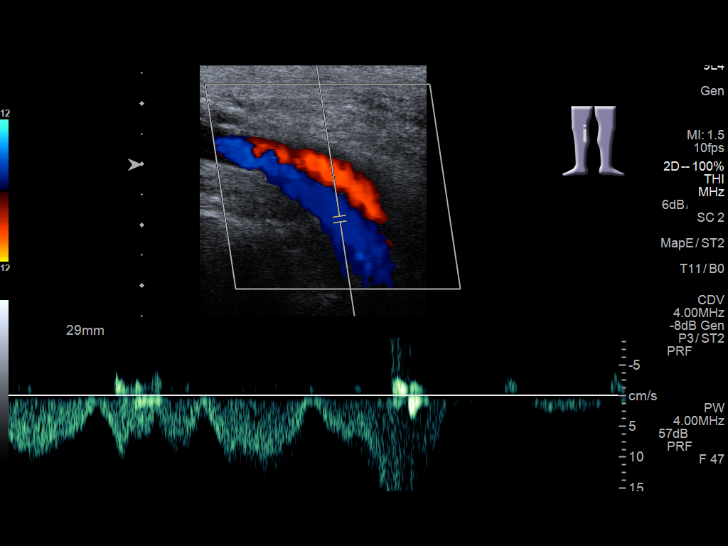
[im 34/38]
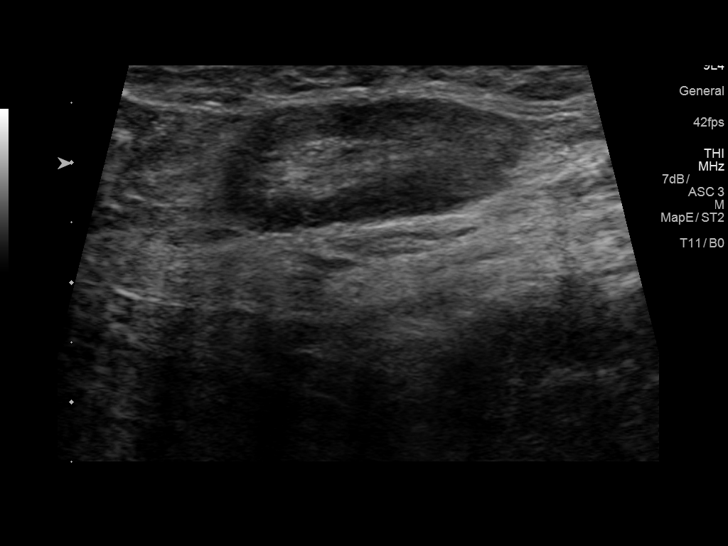
[im 38/38]
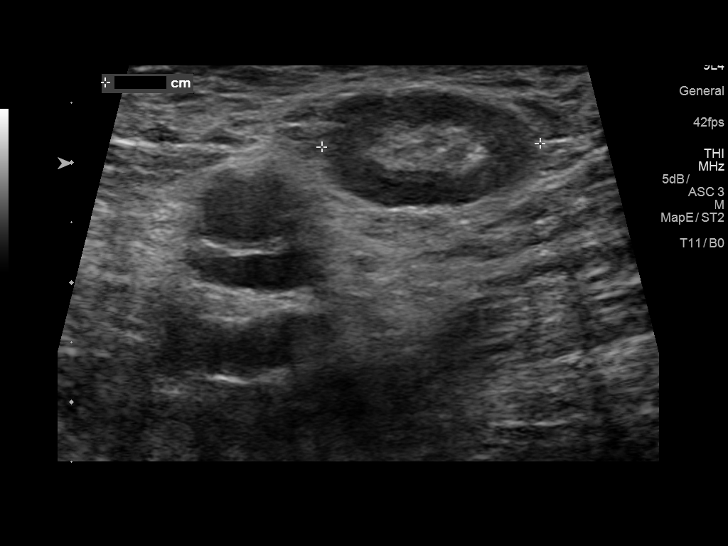

[13 of 24 positions shown; findings below may reference images not displayed]

FINDINGS: Contralateral Common Femoral Vein: Respiratory phasicity is normal
and symmetric with the symptomatic side. No evidence of thrombus.
Normal compressibility.

Common Femoral Vein: No evidence of thrombus. Normal
compressibility, respiratory phasicity and response to augmentation.

Saphenofemoral Junction: No evidence of thrombus. Normal
compressibility and flow on color Doppler imaging.

Profunda Femoral Vein: No evidence of thrombus. Normal
compressibility and flow on color Doppler imaging.

Femoral Vein: No evidence of thrombus. Normal compressibility,
respiratory phasicity and response to augmentation.

Popliteal Vein: No evidence of thrombus. Normal compressibility,
respiratory phasicity and response to augmentation.

Calf Veins: No evidence of thrombus. Normal compressibility and flow
on color Doppler imaging.

Superficial Great Saphenous Vein: No evidence of thrombus. Normal
compressibility and flow on color Doppler imaging.

Venous Reflux:  None.

Other Findings: A prominent 1.8 cm node is noted at the right
inguinal region.
IMPRESSION: 1. No evidence of deep venous thrombosis.
2. Prominent 1.8 cm node at the right inguinal region.

## 2017-08-10 ENCOUNTER — Inpatient Hospital Stay
Admission: EM | Admit: 2017-08-10 | Discharge: 2017-08-14 | DRG: 291 | Disposition: A | Discharge status: Home / Self Care | Payer: Medicare HMO | Attending: Specialist | Admitting: Specialist

## 2017-08-10 ENCOUNTER — Emergency Department: Payer: Medicare HMO

## 2017-08-10 ENCOUNTER — Other Ambulatory Visit: Payer: Self-pay

## 2017-08-10 ENCOUNTER — Encounter: Payer: Self-pay | Admitting: Emergency Medicine

## 2017-08-10 DIAGNOSIS — L03115 Cellulitis of right lower limb: Secondary | ICD-10-CM | POA: Diagnosis not present

## 2017-08-10 DIAGNOSIS — D649 Anemia, unspecified: Secondary | ICD-10-CM | POA: Diagnosis not present

## 2017-08-10 DIAGNOSIS — Z66 Do not resuscitate: Secondary | ICD-10-CM | POA: Diagnosis present

## 2017-08-10 DIAGNOSIS — R0902 Hypoxemia: Secondary | ICD-10-CM | POA: Diagnosis not present

## 2017-08-10 DIAGNOSIS — I482 Chronic atrial fibrillation: Secondary | ICD-10-CM | POA: Diagnosis not present

## 2017-08-10 DIAGNOSIS — Z882 Allergy status to sulfonamides status: Secondary | ICD-10-CM

## 2017-08-10 DIAGNOSIS — F0280 Dementia in other diseases classified elsewhere without behavioral disturbance: Secondary | ICD-10-CM | POA: Diagnosis present

## 2017-08-10 DIAGNOSIS — I10 Essential (primary) hypertension: Secondary | ICD-10-CM | POA: Diagnosis not present

## 2017-08-10 DIAGNOSIS — Z9181 History of falling: Secondary | ICD-10-CM | POA: Diagnosis not present

## 2017-08-10 DIAGNOSIS — J189 Pneumonia, unspecified organism: Secondary | ICD-10-CM | POA: Diagnosis present

## 2017-08-10 DIAGNOSIS — J9601 Acute respiratory failure with hypoxia: Secondary | ICD-10-CM | POA: Diagnosis not present

## 2017-08-10 DIAGNOSIS — Z791 Long term (current) use of non-steroidal anti-inflammatories (NSAID): Secondary | ICD-10-CM | POA: Diagnosis not present

## 2017-08-10 DIAGNOSIS — I4891 Unspecified atrial fibrillation: Secondary | ICD-10-CM | POA: Diagnosis present

## 2017-08-10 DIAGNOSIS — R918 Other nonspecific abnormal finding of lung field: Secondary | ICD-10-CM | POA: Diagnosis not present

## 2017-08-10 DIAGNOSIS — I110 Hypertensive heart disease with heart failure: Principal | ICD-10-CM | POA: Diagnosis present

## 2017-08-10 DIAGNOSIS — Z79899 Other long term (current) drug therapy: Secondary | ICD-10-CM

## 2017-08-10 DIAGNOSIS — M1990 Unspecified osteoarthritis, unspecified site: Secondary | ICD-10-CM | POA: Diagnosis present

## 2017-08-10 DIAGNOSIS — Z888 Allergy status to other drugs, medicaments and biological substances status: Secondary | ICD-10-CM | POA: Diagnosis not present

## 2017-08-10 DIAGNOSIS — R251 Tremor, unspecified: Secondary | ICD-10-CM | POA: Diagnosis present

## 2017-08-10 DIAGNOSIS — R0602 Shortness of breath: Secondary | ICD-10-CM | POA: Diagnosis not present

## 2017-08-10 DIAGNOSIS — G309 Alzheimer's disease, unspecified: Secondary | ICD-10-CM | POA: Diagnosis present

## 2017-08-10 DIAGNOSIS — F0390 Unspecified dementia without behavioral disturbance: Secondary | ICD-10-CM | POA: Diagnosis not present

## 2017-08-10 DIAGNOSIS — I5033 Acute on chronic diastolic (congestive) heart failure: Secondary | ICD-10-CM | POA: Diagnosis present

## 2017-08-10 DIAGNOSIS — J440 Chronic obstructive pulmonary disease with acute lower respiratory infection: Secondary | ICD-10-CM | POA: Diagnosis present

## 2017-08-10 DIAGNOSIS — E876 Hypokalemia: Secondary | ICD-10-CM | POA: Diagnosis not present

## 2017-08-10 DIAGNOSIS — Z7982 Long term (current) use of aspirin: Secondary | ICD-10-CM | POA: Diagnosis not present

## 2017-08-10 DIAGNOSIS — I5031 Acute diastolic (congestive) heart failure: Secondary | ICD-10-CM | POA: Diagnosis not present

## 2017-08-10 DIAGNOSIS — I872 Venous insufficiency (chronic) (peripheral): Secondary | ICD-10-CM | POA: Diagnosis present

## 2017-08-10 DIAGNOSIS — M19071 Primary osteoarthritis, right ankle and foot: Secondary | ICD-10-CM | POA: Diagnosis not present

## 2017-08-10 DIAGNOSIS — H9190 Unspecified hearing loss, unspecified ear: Secondary | ICD-10-CM | POA: Diagnosis present

## 2017-08-10 DIAGNOSIS — Z87891 Personal history of nicotine dependence: Secondary | ICD-10-CM

## 2017-08-10 DIAGNOSIS — R05 Cough: Secondary | ICD-10-CM | POA: Diagnosis not present

## 2017-08-10 DIAGNOSIS — J449 Chronic obstructive pulmonary disease, unspecified: Secondary | ICD-10-CM | POA: Diagnosis not present

## 2017-08-10 DIAGNOSIS — J810 Acute pulmonary edema: Secondary | ICD-10-CM

## 2017-08-10 LAB — BASIC METABOLIC PANEL
Anion gap: 10 (ref 5–15)
BUN: 20 mg/dL (ref 8–23)
CO2: 30 mmol/L (ref 22–32)
Calcium: 9.4 mg/dL (ref 8.9–10.3)
Chloride: 97 mmol/L — ABNORMAL LOW (ref 98–111)
Creatinine, Ser: 0.62 mg/dL (ref 0.44–1.00)
GFR calc Af Amer: >60 mL/min (ref >60)
GFR calc non Af Amer: >60 mL/min (ref >60)
Glucose, Bld: 131 mg/dL — ABNORMAL HIGH (ref 70–99)
Potassium: 3.9 mmol/L (ref 3.5–5.1)
Sodium: 137 mmol/L (ref 135–145)

## 2017-08-10 LAB — CBC WITH DIFFERENTIAL/PLATELET
Basophils Absolute: 0.1 10*3/uL (ref 0–0.1)
Basophils Relative: 0 %
Eosinophils Absolute: 0.1 10*3/uL (ref 0–0.7)
Eosinophils Relative: 1 %
HCT: 34.9 % — ABNORMAL LOW (ref 35.0–47.0)
Hemoglobin: 11.7 g/dL — ABNORMAL LOW (ref 12.0–16.0)
Lymphocytes Relative: 11 %
Lymphs Abs: 1.5 10*3/uL (ref 1.0–3.6)
MCH: 30.5 pg (ref 26.0–34.0)
MCHC: 33.5 g/dL (ref 32.0–36.0)
MCV: 91.2 fL (ref 80.0–100.0)
Monocytes Absolute: 1.5 10*3/uL — ABNORMAL HIGH (ref 0.2–0.9)
Monocytes Relative: 11 %
Neutro Abs: 10.9 10*3/uL — ABNORMAL HIGH (ref 1.4–6.5)
Neutrophils Relative %: 77 %
Platelets: 271 10*3/uL (ref 150–440)
RBC: 3.83 MIL/uL (ref 3.80–5.20)
RDW: 13 % (ref 11.5–14.5)
WBC: 14.1 10*3/uL — ABNORMAL HIGH (ref 3.6–11.0)

## 2017-08-10 LAB — CBC
HEMATOCRIT: 36.6 % (ref 35.0–47.0)
HEMOGLOBIN: 12.2 g/dL (ref 12.0–16.0)
MCH: 30.6 pg (ref 26.0–34.0)
MCHC: 33.4 g/dL (ref 32.0–36.0)
MCV: 91.7 fL (ref 80.0–100.0)
Platelets: 278 10*3/uL (ref 150–440)
RBC: 3.99 MIL/uL (ref 3.80–5.20)
RDW: 12.9 % (ref 11.5–14.5)
WBC: 12.9 10*3/uL — ABNORMAL HIGH (ref 3.6–11.0)

## 2017-08-10 LAB — URINALYSIS, COMPLETE (UACMP) WITH MICROSCOPIC
BILIRUBIN URINE: NEGATIVE
Bacteria, UA: NONE SEEN
GLUCOSE, UA: NEGATIVE mg/dL
Hgb urine dipstick: NEGATIVE
KETONES UR: NEGATIVE mg/dL
Leukocytes, UA: NEGATIVE
NITRITE: NEGATIVE
PH: 5 (ref 5.0–8.0)
Protein, ur: NEGATIVE mg/dL
Specific Gravity, Urine: 1.012 (ref 1.005–1.030)

## 2017-08-10 LAB — BRAIN NATRIURETIC PEPTIDE: B Natriuretic Peptide: 207 pg/mL — ABNORMAL HIGH (ref 0.0–100.0)

## 2017-08-10 LAB — TROPONIN I
TROPONIN I: 0.03 ng/mL — AB (ref <0.03)
Troponin I: 0.04 ng/mL (ref <0.03)

## 2017-08-10 LAB — TSH: TSH: 0.111 u[IU]/mL — ABNORMAL LOW (ref 0.350–4.500)

## 2017-08-10 LAB — CREATININE, SERUM
Creatinine, Ser: 0.67 mg/dL (ref 0.44–1.00)
GFR calc Af Amer: >60 mL/min (ref >60)
GFR calc non Af Amer: >60 mL/min (ref >60)

## 2017-08-10 LAB — PROCALCITONIN: Procalcitonin: 0.15 ng/mL

## 2017-08-10 LAB — MRSA PCR SCREENING: MRSA BY PCR: NEGATIVE

## 2017-08-10 MED ORDER — QUETIAPINE FUMARATE 25 MG PO TABS
25.0000 mg | ORAL_TABLET | Freq: Every day | ORAL | Status: DC
  Administered 2017-08-11 – 2017-08-14 (×4): 25 mg via ORAL
  Filled 2017-08-10 (×4): qty 1

## 2017-08-10 MED ORDER — CALCIUM CARBONATE-VITAMIN D 500-200 MG-UNIT PO TABS
ORAL_TABLET | Freq: Every day | ORAL | Status: DC
  Administered 2017-08-11 – 2017-08-14 (×3): 1 via ORAL
  Filled 2017-08-10 (×4): qty 1

## 2017-08-10 MED ORDER — DILTIAZEM HCL ER COATED BEADS 300 MG PO CP24
300.0000 mg | ORAL_CAPSULE | Freq: Every day | ORAL | Status: DC
  Administered 2017-08-11 – 2017-08-14 (×4): 300 mg via ORAL
  Filled 2017-08-10 (×4): qty 1

## 2017-08-10 MED ORDER — LEVOFLOXACIN IN D5W 750 MG/150ML IV SOLN
750.0000 mg | Freq: Once | INTRAVENOUS | Status: AC
  Administered 2017-08-10: 750 mg via INTRAVENOUS
  Filled 2017-08-10: qty 150

## 2017-08-10 MED ORDER — SODIUM CHLORIDE 0.9 % IV SOLN: 250.0000 mL | INTRAVENOUS | Status: DC | PRN

## 2017-08-10 MED ORDER — FERROUS SULFATE 325 (65 FE) MG PO TABS
325.0000 mg | ORAL_TABLET | Freq: Every day | ORAL | Status: DC
  Administered 2017-08-11 – 2017-08-14 (×4): 325 mg via ORAL
  Filled 2017-08-10 (×4): qty 1

## 2017-08-10 MED ORDER — FUROSEMIDE 10 MG/ML IJ SOLN
40.0000 mg | Freq: Once | INTRAMUSCULAR | Status: AC
  Administered 2017-08-10: 40 mg via INTRAVENOUS
  Filled 2017-08-10: qty 4

## 2017-08-10 MED ORDER — HALOPERIDOL LACTATE 5 MG/ML IJ SOLN
2.0000 mg | Freq: Four times a day (QID) | INTRAMUSCULAR | Status: DC | PRN
  Administered 2017-08-10: 2 mg via INTRAVENOUS
  Filled 2017-08-10: qty 1

## 2017-08-10 MED ORDER — GUAIFENESIN-DM 100-10 MG/5ML PO SYRP
5.0000 mL | ORAL_SOLUTION | ORAL | Status: DC | PRN
  Administered 2017-08-11: 5 mL via ORAL
  Filled 2017-08-10: qty 5

## 2017-08-10 MED ORDER — ACETAMINOPHEN 650 MG RE SUPP: 650.0000 mg | Freq: Four times a day (QID) | RECTAL | Status: DC | PRN

## 2017-08-10 MED ORDER — SODIUM CHLORIDE 0.9% FLUSH
3.0000 mL | Freq: Two times a day (BID) | INTRAVENOUS | Status: DC
  Administered 2017-08-10 – 2017-08-14 (×8): 3 mL via INTRAVENOUS

## 2017-08-10 MED ORDER — ONDANSETRON HCL 4 MG/2ML IJ SOLN: 4.0000 mg | Freq: Four times a day (QID) | INTRAMUSCULAR | Status: DC | PRN

## 2017-08-10 MED ORDER — FUROSEMIDE 10 MG/ML IJ SOLN
20.0000 mg | Freq: Two times a day (BID) | INTRAMUSCULAR | Status: DC
  Administered 2017-08-10 – 2017-08-12 (×4): 20 mg via INTRAVENOUS
  Filled 2017-08-10 (×4): qty 2

## 2017-08-10 MED ORDER — GUAIFENESIN ER 600 MG PO TB12
600.0000 mg | ORAL_TABLET | Freq: Two times a day (BID) | ORAL | Status: DC
  Administered 2017-08-10 – 2017-08-14 (×8): 600 mg via ORAL
  Filled 2017-08-10 (×8): qty 1

## 2017-08-10 MED ORDER — ASPIRIN EC 81 MG PO TBEC
81.0000 mg | DELAYED_RELEASE_TABLET | Freq: Every day | ORAL | Status: DC
  Administered 2017-08-11 – 2017-08-14 (×4): 81 mg via ORAL
  Filled 2017-08-10 (×4): qty 1

## 2017-08-10 MED ORDER — ENOXAPARIN SODIUM 40 MG/0.4ML ~~LOC~~ SOLN
40.0000 mg | SUBCUTANEOUS | Status: DC
  Administered 2017-08-10 – 2017-08-13 (×4): 40 mg via SUBCUTANEOUS
  Filled 2017-08-10 (×4): qty 0.4

## 2017-08-10 MED ORDER — DIGOXIN 125 MCG PO TABS
0.1250 mg | ORAL_TABLET | Freq: Every day | ORAL | Status: DC
  Administered 2017-08-11 – 2017-08-14 (×4): 0.125 mg via ORAL
  Filled 2017-08-10 (×4): qty 1

## 2017-08-10 MED ORDER — ALBUTEROL SULFATE (2.5 MG/3ML) 0.083% IN NEBU: 3.0000 mL | INHALATION_SOLUTION | Freq: Four times a day (QID) | RESPIRATORY_TRACT | Status: DC | PRN

## 2017-08-10 MED ORDER — IPRATROPIUM-ALBUTEROL 0.5-2.5 (3) MG/3ML IN SOLN
3.0000 mL | Freq: Four times a day (QID) | RESPIRATORY_TRACT | Status: DC | PRN
  Administered 2017-08-10 – 2017-08-11 (×2): 3 mL via RESPIRATORY_TRACT
  Filled 2017-08-10 (×3): qty 3

## 2017-08-10 MED ORDER — LEVOFLOXACIN IN D5W 750 MG/150ML IV SOLN
750.0000 mg | INTRAVENOUS | Status: DC
  Administered 2017-08-12: 750 mg via INTRAVENOUS
  Filled 2017-08-10 (×2): qty 150

## 2017-08-10 MED ORDER — SODIUM CHLORIDE 0.9% FLUSH: 3.0000 mL | INTRAVENOUS | Status: DC | PRN

## 2017-08-10 MED ORDER — DONEPEZIL HCL 5 MG PO TABS
20.0000 mg | ORAL_TABLET | Freq: Every day | ORAL | Status: DC
  Administered 2017-08-11 – 2017-08-14 (×4): 20 mg via ORAL
  Filled 2017-08-10 (×4): qty 4

## 2017-08-10 MED ORDER — FLUTICASONE PROPIONATE 50 MCG/ACT NA SUSP
1.0000 | Freq: Every day | NASAL | Status: DC
  Administered 2017-08-11 – 2017-08-14 (×4): 1 via NASAL
  Filled 2017-08-10: qty 16

## 2017-08-10 MED ORDER — ORAL CARE MOUTH RINSE
15.0000 mL | Freq: Two times a day (BID) | OROMUCOSAL | Status: DC
  Administered 2017-08-11 – 2017-08-13 (×4): 15 mL via OROMUCOSAL

## 2017-08-10 MED ORDER — ONDANSETRON HCL 4 MG PO TABS: 4.0000 mg | ORAL_TABLET | Freq: Four times a day (QID) | ORAL | Status: DC | PRN

## 2017-08-10 MED ORDER — ACETAMINOPHEN 325 MG PO TABS
650.0000 mg | ORAL_TABLET | Freq: Four times a day (QID) | ORAL | Status: DC | PRN
  Administered 2017-08-11 (×2): 650 mg via ORAL
  Filled 2017-08-10 (×2): qty 2

## 2017-08-10 MED ORDER — LISINOPRIL 5 MG PO TABS
5.0000 mg | ORAL_TABLET | Freq: Every day | ORAL | Status: DC
  Administered 2017-08-11 – 2017-08-14 (×4): 5 mg via ORAL
  Filled 2017-08-10 (×4): qty 1

## 2017-08-10 MED ORDER — DOCUSATE SODIUM 50 MG PO CAPS
50.0000 mg | ORAL_CAPSULE | Freq: Every day | ORAL | Status: DC
  Filled 2017-08-10 (×2): qty 1

## 2017-08-10 NOTE — H&P (Addendum)
[place] [facility] [place] NAME: Patricia Foley    MR#:  [id number]  DATE OF BIRTH:  1931-07-09  DATE OF ADMISSION:  08/10/2017  PRIMARY CARE PHYSICIAN: Virginia Crews, MD   REQUESTING/REFERRING PHYSICIAN:   CHIEF COMPLAINT:   Chief Complaint  Patient presents with  . Cough    HISTORY OF PRESENT ILLNESS: Patricia Foley  is a 84 y.o. female with a known history of osteoarthritis, atrial fibrillation, COPD, dementia, essential hypertension and hypothyroidism who is currently residing in Alzheimer's unit at [facility] assisted [facility].  Patient is brought in with complaint of cough and shortness of breath.  In the ER she is noted to have hypoxia and is needing oxygen.  Her grandson and healthcare power of attorney is bedside states that the symptoms started recently.  He did not states that she has not had any nausea vomiting or diarrhea. PAST MEDICAL HISTORY:   Past Medical History:  Diagnosis Date  . Anemia   . Arthritis   . Atrial fibrillation ([place])   . Circulation problem   . COPD (chronic obstructive pulmonary disease) ([place])   . Cough    CHRONIC  . Dementia   . GERD (gastroesophageal reflux disease)   . HOH (hard of hearing)   . Hypertension   . Hypothyroidism   . Tremors of nervous system   . Venous insufficiency     PAST SURGICAL HISTORY:  Past Surgical History:  Procedure Laterality Date  . ABDOMINAL HYSTERECTOMY     benign indication  . CATARACT EXTRACTION W/PHACO Right 05/04/2016   Procedure: CATARACT EXTRACTION PHACO AND INTRAOCULAR LENS PLACEMENT (IOC);  Surgeon: Birder Robson, MD;  Location: [facility] ORS;  Service: Ophthalmology;  Laterality: Right;  [place] 3:02.7 AP% 23.1 CDE 42.15 Fluid pack lot # [id number]  . CATARACT EXTRACTION W/PHACO Left 06/29/2016   Procedure: CATARACT EXTRACTION PHACO AND INTRAOCULAR LENS PLACEMENT (IOC);  Surgeon: Birder Robson, MD;  Location: [facility] ORS;  Service: Ophthalmology;  Laterality:  Left;  [place] 2:53.3 AP% 27.7 CDE 48.16 Fluid Pack Lot # [id number]    SOCIAL HISTORY:  Social History   Tobacco Use  . Smoking status: Former Smoker    Packs/day: 1.00    Years: 50.00    Pack years: 50.00    Types: Cigarettes    Last attempt to quit: 04/14/2016    Years since quitting: 1.3  . Smokeless tobacco: Never Used  Substance Use Topics  . Alcohol use: No    FAMILY HISTORY:  Family History  Problem Relation Age of Onset  . Heart disease Father   . Heart disease Mother   . Heart disease Sister   . Heart disease Brother   . Dementia Brother   . Heart attack Son 22  . Dementia Brother     DRUG ALLERGIES:  Allergies  Allergen Reactions  . Sulfa Antibiotics Hives  . Coumadin [Warfarin] Rash    REVIEW OF SYSTEMS:   CONSTITUTIONAL: Unable to provide due to dementia    MEDICATIONS AT HOME:  Prior to Admission medications   Medication Sig Start Date End Date Taking? Authorizing Provider  acetaminophen (TYLENOL) 325 MG tablet Take 2 tablets (650 mg total) by mouth every 6 (six) hours as needed for mild pain (or Fever >/= 101). 12/17/16   Mar Daring, PA-C  albuterol (PROVENTIL HFA;VENTOLIN HFA) 108 (90 Base) MCG/ACT inhaler Inhale 2 puffs into the lungs every 6 (six) hours as needed for wheezing or shortness of breath. 11/11/16  Virginia Crews, MD  aspirin 81 MG EC tablet TAKE 1 TABLET BY MOUTH EVERY DAY 06/27/17   Bacigalupo, Dionne Bucy, MD  Calcium Carbonate-Vitamin D (CALCIUM 600+D) 600-200 MG-UNIT TABS Take 1 tablet by mouth daily.    [provider]  digoxin (LANOXIN) 0.125 MG tablet Take 1 tablet (0.125 mg total) daily by mouth. 12/22/16   Virginia Crews, MD  diltiazem (TIAZAC) 300 MG 24 hr capsule Take 1 capsule (300 mg total) by mouth daily. 06/15/17   Virginia Crews, MD  Docusate Sodium (COLACE PO) Take 50 mg by mouth.    [provider]  donepezil (ARICEPT) 10 MG tablet Take 20 mg by mouth daily.     [provider]  ferrous sulfate 325 (65 FE) MG tablet Take 325 mg by mouth daily with breakfast.    [provider]  fexofenadine (MUCINEX ALLERGY) 180 MG tablet Take 180 mg by mouth daily.    [provider]  fluticasone (FLONASE) 50 MCG/ACT nasal spray Place 1 spray into the nose daily. 02/04/16 08/02/17  [provider]  furosemide (LASIX) 40 MG tablet Take 40 mg by mouth daily.     [provider]  hydrocortisone (PROCTOSOL HC) 2.5 % rectal cream Apply 1 application topically 3 (three) times daily. 10/05/16   [provider]  lisinopril (PRINIVIL,ZESTRIL) 5 MG tablet Take 1 tablet (5 mg total) by mouth daily. 08/02/17   Virginia Crews, MD  mupirocin ointment (BACTROBAN) 2 % Apply 1 application topically 2 (two) times daily. Patient not taking: Reported on 08/02/2017 06/15/17   Virginia Crews, MD  naproxen (NAPROSYN) 500 MG tablet TAKE 1 TABLET (500 MG TOTAL) BY MOUTH 2 (TWO) TIMES DAILY WITH A MEAL. ANKLE PAIN SWELLING 03/30/17   Virginia Crews, MD  Phenylephrine-APAP-Guaifenesin [facility] FAST-MAX) [phone] MG/20ML LIQD Take by mouth.    [provider]  potassium chloride SA (K-DUR,KLOR-CON) 20 MEQ tablet Take 1 tablet (20 mEq total) by mouth daily. 11/22/16   Virginia Crews, MD  QUEtiapine (SEROQUEL) 25 MG tablet Take 1 tablet by mouth daily. 09/07/16   [provider]  triamcinolone cream (KENALOG) 0.1 % Apply 1 application topically 2 (two) times daily. Due to facial picking    [provider]      PHYSICAL EXAMINATION:   VITAL SIGNS: Blood pressure (!) 165/58, pulse 85, temperature 97.7 F (36.5 C), resp. rate 18, height 5\' 2"  (1.575 m), weight 65.9 kg (145 lb 4.8 oz), SpO2 90 %.  GENERAL:  84 y.o.-year-old patient lying in the bed with no acute distress.  EYES: Pupils equal, round, reactive to light and accommodation.  Bilateral conjunctival injection also drainage from the eye HEENT: Head  atraumatic, normocephalic. Oropharynx and nasopharynx clear.  NECK:  Supple, no jugular venous distention. No thyroid enlargement, no tenderness.  LUNGS: Rhonchus breath sounds bilaterally without accessory muscle use CARDIOVASCULAR: S1, S2 normal. No murmurs, rubs, or gallops.  ABDOMEN: Soft, nontender, nondistended. Bowel sounds present. No organomegaly or mass.  EXTREMITIES: No pedal edema, cyanosis, or clubbing.  NEUROLOGIC: Patient unable to provide any review of systems PSYCHIATRIC: Unable to provide any review of systems due to her dementia sKIN: No obvious rash, lesion, or ulcer.   LABORATORY PANEL:   CBC Recent Labs  Lab 08/10/17 1238 08/10/17 1611  WBC 14.1* 12.9*  HGB 11.7* 12.2  HCT 34.9* 36.6  PLT 271 278  MCV 91.2 91.7  MCH 30.5 30.6  MCHC 33.5 33.4  RDW 13.0  12.9  LYMPHSABS 1.5  --   MONOABS 1.5*  --   EOSABS 0.1  --   BASOSABS 0.1  --    ------------------------------------------------------------------------------------------------------------------  Chemistries  Recent Labs  Lab 08/10/17 1238  NA 137  K 3.9  CL 97*  CO2 30  GLUCOSE 131*  BUN 20  CREATININE 0.62  CALCIUM 9.4   ------------------------------------------------------------------------------------------------------------------ estimated creatinine clearance is 46.6 mL/min (by C-G formula based on SCr of 0.62 mg/dL). ------------------------------------------------------------------------------------------------------------------ No results for input(s): TSH, T4TOTAL, T3FREE, THYROIDAB in the last 72 hours.  Invalid input(s): FREET3   Coagulation profile No results for input(s): INR, PROTIME in the last 168 hours. ------------------------------------------------------------------------------------------------------------------- No results for input(s): DDIMER in the last 72  hours. -------------------------------------------------------------------------------------------------------------------  Cardiac Enzymes Recent Labs  Lab 08/10/17 1238  TROPONINI 0.04*   ------------------------------------------------------------------------------------------------------------------ Invalid input(s): POCBNP  ---------------------------------------------------------------------------------------------------------------  Urinalysis    Component Value Date/Time   COLORURINE YELLOW (A) 08/10/2017 1238   APPEARANCEUR CLEAR (A) 08/10/2017 1238   APPEARANCEUR HAZY 05/21/2013 1518   LABSPEC 1.012 08/10/2017 1238   LABSPEC 1.020 05/21/2013 1518   PHURINE 5.0 08/10/2017 1238   GLUCOSEU NEGATIVE 08/10/2017 1238   GLUCOSEU NEGATIVE 05/21/2013 1518   HGBUR NEGATIVE 08/10/2017 1238   BILIRUBINUR NEGATIVE 08/10/2017 1238   BILIRUBINUR Negative 02/10/2017 1159   BILIRUBINUR NEGATIVE 05/21/2013 1518   KETONESUR NEGATIVE 08/10/2017 1238   PROTEINUR NEGATIVE 08/10/2017 1238   UROBILINOGEN 0.2 02/10/2017 1159   NITRITE NEGATIVE 08/10/2017 1238   LEUKOCYTESUR NEGATIVE 08/10/2017 1238   LEUKOCYTESUR NEGATIVE 05/21/2013 1518     RADIOLOGY: Dg Chest 2 View  Result Date: 08/10/2017 CLINICAL DATA:  Nonproductive cough x3 days. Hx of A-fib, COPD, HTN. Former smoker. EXAM: CHEST - 2 VIEW COMPARISON:  07/20/2014 FINDINGS: The heart is enlarged. There are patchy infiltrates in the lung bases bilaterally. Small bilateral pleural effusions are present. There is mild pulmonary vascular congestion. Remote fracture of the LEFT clavicle. There is dense atherosclerotic calcification of the aorta. IMPRESSION: Cardiomegaly and bilateral airspace filling opacities/bilateral pleural effusions. Findings are consistent with pulmonary edema. Infectious process could have a similar appearance. Aortic atherosclerosis.  (ICD10-I70.0) Electronically Signed   By: Nolon Nations M.D.   On: 08/10/2017  13:45    EKG: Orders placed or performed during the hospital encounter of 08/10/17  . ED EKG  . ED EKG  . EKG 12-Lead  . EKG 12-Lead    IMPRESSION AND PLAN: Patient is a 84 year old female brought in with cough  1.  Shortness of breath with hypoxia This is likely related to pneumonia more than congestive heart failure I will treat with IV Levaquin Speech eval make sure her swallowing is okay We will give low-dose IV Lasix Obtain echo of the heart  2.  History of atrial fibrillation continue digoxin and Cardizem  3.  Essential hypertension continue lisinopril  4.  Alzheimer's dementia continue Aricept and Seroquel at nighttime  5.  Miscellaneous Lovenox for DVT prophylaxis  All the records are reviewed and case discussed with ED provider. Management plans discussed with the patient, family and they are in agreement.  CODE STATUS: Code Status History    Date Active Date Inactive Code Status Order ID Comments User Context   10/01/2016 1726 10/03/2016 1514 Full Code [id number]  Dustin Flock, MD Inpatient   10/01/2016 1623 10/01/2016 1726 DNR [id number]  Dustin Flock, MD ED   05/25/2016 1200 05/26/2016 2359 DNR [id number]  Bettey Costa, MD Inpatient   05/24/2016 1227 05/25/2016 1200 Full Code [id number]  Vianne Bulls,  Lise Auer, MD ED    Advance Directive Documentation     Most Recent Value  Type of Advance Directive  Healthcare Power of Attorney, Living will  Pre-existing out of facility DNR order (yellow form or pink MOST form)  -  "MOST" Form in Place?  -       TOTAL TIME TAKING CARE OF THIS PATIENT: 55 minutes.    Dustin Flock M.D on 08/10/2017 at 6:13 PM  Between 7am to 6pm - Pager - [phone]  After 6pm go to [email or web address] - password [email or web address] [facility]  [phone]  CC: Primary care physician; Virginia Crews, MD

## 2017-08-10 NOTE — Progress Notes (Signed)
Pharmacy Antibiotic Note  Patricia Foley is a 84 y.o. female admitted on 08/10/2017 with pneumonia.  Pharmacy has been consulted for Levaquin dosing.  Plan: Levaquin 750 mg iv q 48 hours.   Height: 5\' 2"  (157.5 cm) Weight: 145 lb 4.8 oz (65.9 kg) IBW/kg (Calculated) : 50.1  Temp (24hrs), Avg:98.3 F (36.8 C), Min:97.7 F (36.5 C), Max:98.8 F (37.1 C)  Recent Labs  Lab 08/10/17 1238 08/10/17 1611  WBC 14.1* 12.9*  CREATININE 0.62  --     Estimated Creatinine Clearance: 46.6 mL/min (by C-G formula based on SCr of 0.62 mg/dL).    Allergies  Allergen Reactions  . Sulfa Antibiotics Hives  . Coumadin [Warfarin] Rash    Antimicrobials this admission: Levaquin 6/26 >>   Dose adjustments this admission:   Microbiology results: 6/26 MRSA PCR: sent  Thank you for allowing pharmacy to be a part of this patient's care.  Napoleon Form 08/10/2017 6:14 PM

## 2017-08-10 NOTE — ED Notes (Signed)
Critical troponin of 0.04 reported by chemistry lab. Jimmye Norman, MD made aware.

## 2017-08-10 NOTE — Progress Notes (Signed)
Patient started getting agitated, no prn orders. Patient did not want to leave her oxygen on, insisted on staying up and walking out. Patient very short of breath. MD paged to notify, verbal orders with read back to give haloperidol 2mg  Q6 PRN for agitation. Administered, handoff to night nurse. Nursing will continue to monitor patient.

## 2017-08-10 NOTE — ED Notes (Signed)
Pt taken to Floor on stretcher with O2. VSS. NAD. Report called to floor.

## 2017-08-10 NOTE — ED Triage Notes (Signed)
Pt to ED with granddaughter c/o cough since Sunday, non productive, found to be hypoxic 73% RA in triage, not normally on oxygen.  Pt placed on 4L [place] and oxygen up to 92%.  Pt with hx of dementia.

## 2017-08-10 NOTE — ED Provider Notes (Signed)
[facility] Emergency Department Provider Note       Time seen: ----------------------------------------- 12:39 PM on 08/10/2017 -----------------------------------------   I have reviewed the triage vital signs and the nursing notes.  HISTORY   Chief Complaint Cough    HPI Patricia Foley is a 84 y.o. female with a history of anemia, arthritis, A. fib, COPD, dementia, hypertension who presents to the ED for cough since Sunday.  Cough is been nonproductive, she was found to be markedly hypoxic here on arrival with oxygen saturation in the mid 70s.  She does not normally take oxygen.  She has a history of dementia but otherwise denies complaints.  Past Medical History:  Diagnosis Date  . Anemia   . Arthritis   . Atrial fibrillation ([place])   . Circulation problem   . COPD (chronic obstructive pulmonary disease) ([place])   . Cough    CHRONIC  . Dementia   . GERD (gastroesophageal reflux disease)   . HOH (hard of hearing)   . Hypertension   . Hypothyroidism   . Tremors of nervous system   . Venous insufficiency     Patient Active Problem List   Diagnosis Date Noted  . Nonhealing nonsurgical wound 05/11/2017  . Callus of foot 05/11/2017  . Urinary frequency 02/10/2017  . COPD (chronic obstructive pulmonary disease) ([place])   . Dementia   . GERD (gastroesophageal reflux disease)   . Hypertension   . Tremors of nervous system   . Hypothyroidism   . Atrial fibrillation ([place])   . HOH (hard of hearing)   . Anemia   . Arthritis   . Chronic venous insufficiency 10/04/2015    Past Surgical History:  Procedure Laterality Date  . ABDOMINAL HYSTERECTOMY     benign indication  . CATARACT EXTRACTION W/PHACO Right 05/04/2016   Procedure: CATARACT EXTRACTION PHACO AND INTRAOCULAR LENS PLACEMENT (IOC);  Surgeon: Birder Robson, MD;  Location: [facility] ORS;  Service: Ophthalmology;  Laterality: Right;  [place] 3:02.7 AP% 23.1 CDE 42.15 Fluid pack lot # [id number]   . CATARACT EXTRACTION W/PHACO Left 06/29/2016   Procedure: CATARACT EXTRACTION PHACO AND INTRAOCULAR LENS PLACEMENT (IOC);  Surgeon: Birder Robson, MD;  Location: [facility] ORS;  Service: Ophthalmology;  Laterality: Left;  [place] 2:53.3 AP% 27.7 CDE 48.16 Fluid Pack Lot # [id number]    Allergies Sulfa antibiotics and Coumadin [warfarin]  Social History Social History   Tobacco Use  . Smoking status: Former Smoker    Packs/day: 1.00    Years: 50.00    Pack years: 50.00    Types: Cigarettes    Last attempt to quit: 04/14/2016    Years since quitting: 1.3  . Smokeless tobacco: Never Used  Substance Use Topics  . Alcohol use: No  . Drug use: No   Review of Systems Constitutional: Negative for fever. Cardiovascular: Negative for chest pain. Respiratory: Positive for shortness of breath and cough Gastrointestinal: Negative for abdominal pain, vomiting and diarrhea. Musculoskeletal: Negative for back pain. Skin: Negative for rash. Neurological: Negative for headaches, focal weakness or numbness.  All systems negative/normal/unremarkable except as stated in the HPI  ____________________________________________   PHYSICAL EXAM:  VITAL SIGNS: ED Triage Vitals  Enc Vitals Group     BP 08/10/17 1217 135/85     Pulse Rate 08/10/17 1217 78     Resp 08/10/17 1217 16     Temp 08/10/17 1217 98.8 F (37.1 C)     Temp Source 08/10/17 1217 Oral     SpO2 08/10/17  1217 93 %     Weight 08/10/17 1218 145 lb (65.8 kg)     Height 08/10/17 1236 4\' 11"  (1.499 m)     Head Circumference --      Peak Flow --      Pain Score 08/10/17 1218 0     Pain Loc --      Pain Edu? --      Excl. in [place]? --    Constitutional: Alert and oriented.  Mild distress Eyes: Conjunctivae are normal. Normal extraocular movements. ENT   Head: Normocephalic and atraumatic.   Nose: No congestion/rhinnorhea.   Mouth/Throat: Mucous membranes are moist.   Neck: No stridor. Cardiovascular: Normal rate,  regular rhythm. No murmurs, rubs, or gallops. Respiratory: Tachypnea with rales bilaterally Gastrointestinal: Soft and nontender. Normal bowel sounds Musculoskeletal: Nontender with normal range of motion in extremities.  Minimal peripheral edema is noted Neurologic:  Normal speech and language. No gross focal neurologic deficits are appreciated.  Skin:  Skin is warm, dry and intact. No rash noted. Psychiatric: Mood and affect are normal. Speech and behavior are normal.  ____________________________________________  EKG: Interpreted by me.  Sinus rhythm with a rate of 76 bpm, normal PR interval, normal QRS, normal QT  ____________________________________________  ED COURSE:  As part of my medical decision making, I reviewed the following data within the [place] History obtained from family if available, nursing notes, old chart and ekg, as well as notes from prior ED visits. Patient presented for cough and hypoxia, we will assess with labs and imaging as indicated at this time.   Procedures ____________________________________________   LABS (pertinent positives/negatives)  Labs Reviewed  CBC WITH DIFFERENTIAL/PLATELET - Abnormal; Notable for the following components:      Result Value   WBC 14.1 (*)    Hemoglobin 11.7 (*)    HCT 34.9 (*)    Neutro Abs 10.9 (*)    Monocytes Absolute 1.5 (*)    All other components within normal limits  BASIC METABOLIC PANEL - Abnormal; Notable for the following components:   Chloride 97 (*)    Glucose, Bld 131 (*)    All other components within normal limits  BRAIN NATRIURETIC PEPTIDE - Abnormal; Notable for the following components:   B Natriuretic Peptide 207.0 (*)    All other components within normal limits  TROPONIN I - Abnormal; Notable for the following components:   Troponin I 0.04 (*)    All other components within normal limits    RADIOLOGY Images were viewed by me  Chest x-ray IMPRESSION: Cardiomegaly  and bilateral airspace filling opacities/bilateral pleural effusions.  Findings are consistent with pulmonary edema. Infectious process could have a similar appearance.  Aortic atherosclerosis.  (ICD10-I70.0) ____________________________________________  DIFFERENTIAL DIAGNOSIS   CHF, COPD, pneumonia, PE, pneumothorax, pleural effusions  FINAL ASSESSMENT AND PLAN  Cough, hypoxia, pulmonary edema   Plan: The patient had presented for cough and hypoxia which improved on nasal cannula oxygen. Patient's labs did reveal some leukocytosis with a mildly elevated BNP. Patient's imaging was more suggestive of pulmonary edema than infectious process although we may need to treat her for both for the time being.  I did order IV Levaquin and have added IV Lasix as well.  Patient would benefit from admission due to the persistent hypoxia at this time.   Laurence Aly, MD   Note: This note was generated in part or whole with voice recognition software. Voice recognition is usually quite accurate but there  are transcription errors that can and very often do occur. I apologize for any typographical errors that were not detected and corrected.     Earleen Newport, MD 08/10/17 1359

## 2017-08-10 NOTE — ED Notes (Signed)
Patient transported to X-ray 

## 2017-08-10 NOTE — Progress Notes (Signed)
Advanced care plan.  Purpose of the Encounter: CODE STATUS  Parties in Attendance: Patient herself and her grandson TIM Dorothyann Peng  Patient's Decision Capacity: Intact  Subjective/Patient's story: Patricia Foley  is a 84 y.o. female with a known history of osteoarthritis, atrial fibrillation, COPD, dementia, essential hypertension and hypothyroidism who is currently residing in Alzheimer's unit at [facility] assisted [facility].  Patient is brought in with complaint of cough and shortness of breath.  In the ER she is noted to have hypoxia.     Objective/Medical story During her previous admission patient was a full code.  I discussed with her grandson regarding CODE STATUS.  He states that she is a DNR.  I do not have any documentation of this.  I confirm with being regarding her wishes.   Goals of care determination: DNR    CODE STATUS: DNR   Time spent discussing advanced care planning: 16 minutes

## 2017-08-10 NOTE — ED Notes (Signed)
Transporting Pt to [phone]

## 2017-08-11 ENCOUNTER — Ambulatory Visit: Payer: Self-pay | Admitting: Family Medicine

## 2017-08-11 ENCOUNTER — Inpatient Hospital Stay
Admit: 2017-08-11 | Discharge: 2017-08-11 | Disposition: A | Discharge status: Home / Self Care | Payer: Medicare HMO | Attending: Internal Medicine | Admitting: Internal Medicine

## 2017-08-11 LAB — CBC
HCT: 31.9 % — ABNORMAL LOW (ref 35.0–47.0)
Hemoglobin: 11.1 g/dL — ABNORMAL LOW (ref 12.0–16.0)
MCH: 31.5 pg (ref 26.0–34.0)
MCHC: 34.8 g/dL (ref 32.0–36.0)
MCV: 90.5 fL (ref 80.0–100.0)
PLATELETS: 273 10*3/uL (ref 150–440)
RBC: 3.52 MIL/uL — ABNORMAL LOW (ref 3.80–5.20)
RDW: 13 % (ref 11.5–14.5)
WBC: 15.1 10*3/uL — AB (ref 3.6–11.0)

## 2017-08-11 LAB — BASIC METABOLIC PANEL
ANION GAP: 10 (ref 5–15)
BUN: 14 mg/dL (ref 8–23)
CALCIUM: 8.5 mg/dL — AB (ref 8.9–10.3)
CO2: 32 mmol/L (ref 22–32)
Chloride: 95 mmol/L — ABNORMAL LOW (ref 98–111)
Creatinine, Ser: 0.43 mg/dL — ABNORMAL LOW (ref 0.44–1.00)
GFR calc Af Amer: >60 mL/min (ref >60)
Glucose, Bld: 111 mg/dL — ABNORMAL HIGH (ref 70–99)
Potassium: 3.1 mmol/L — ABNORMAL LOW (ref 3.5–5.1)
SODIUM: 137 mmol/L (ref 135–145)

## 2017-08-11 LAB — ECHOCARDIOGRAM COMPLETE
Height: 62 in
WEIGHTICAEL: 2324.8 [oz_av]

## 2017-08-11 LAB — TROPONIN I: Troponin I: 0.05 ng/mL (ref <0.03)

## 2017-08-11 MED ORDER — POTASSIUM CHLORIDE CRYS ER 20 MEQ PO TBCR
40.0000 meq | EXTENDED_RELEASE_TABLET | Freq: Once | ORAL | Status: AC
  Administered 2017-08-11: 40 meq via ORAL
  Filled 2017-08-11: qty 2

## 2017-08-11 MED ORDER — POTASSIUM CHLORIDE CRYS ER 20 MEQ PO TBCR
20.0000 meq | EXTENDED_RELEASE_TABLET | Freq: Every day | ORAL | Status: DC
  Administered 2017-08-12 – 2017-08-14 (×3): 20 meq via ORAL
  Filled 2017-08-11 (×3): qty 1

## 2017-08-11 MED ORDER — DOCUSATE SODIUM 50 MG/5ML PO LIQD
50.0000 mg | Freq: Every day | ORAL | Status: DC
  Administered 2017-08-12: 50 mg via ORAL
  Filled 2017-08-11 (×4): qty 10

## 2017-08-11 NOTE — Progress Notes (Signed)
[place] at [place] NAME: Patricia Foley    MR#:  [id number]  DATE OF BIRTH:  [birth year]  SUBJECTIVE:  CHIEF COMPLAINT:   Chief Complaint  Patient presents with  . Cough   Came in with cough and shortness of breath for last few days. Feels little better now.  REVIEW OF SYSTEMS:  CONSTITUTIONAL: No fever, fatigue or weakness.  EYES: No blurred or double vision.  EARS, NOSE, AND THROAT: No tinnitus or ear pain.  RESPIRATORY: have cough, shortness of breath, no wheezing or hemoptysis.  CARDIOVASCULAR: No chest pain, orthopnea, edema.  GASTROINTESTINAL: No nausea, vomiting, diarrhea or abdominal pain.  GENITOURINARY: No dysuria, hematuria.  ENDOCRINE: No polyuria, nocturia,  HEMATOLOGY: No anemia, easy bruising or bleeding SKIN: No rash or lesion. MUSCULOSKELETAL: No joint pain or arthritis.   NEUROLOGIC: No tingling, numbness, weakness.  PSYCHIATRY: No anxiety or depression.   ROS  DRUG ALLERGIES:   Allergies  Allergen Reactions  . Sulfa Antibiotics Hives  . Coumadin [Warfarin] Rash    VITALS:  Blood pressure 138/78, pulse 68, temperature 98.6 F (37 C), temperature source Oral, resp. rate 18, height 5\' 2"  (1.575 m), weight 65.9 kg (145 lb 4.8 oz), SpO2 91 %.  PHYSICAL EXAMINATION:  GENERAL:  84 y.o.-year-old patient lying in the bed with no acute distress.  EYES: Pupils equal, round, reactive to light and accommodation. No scleral icterus. Extraocular muscles intact.  HEENT: Head atraumatic, normocephalic. Oropharynx and nasopharynx clear.  NECK:  Supple, no jugular venous distention. No thyroid enlargement, no tenderness.  LUNGS: Normal breath sounds bilaterally, no wheezing, some crepitation. No use of accessory muscles of respiration.  CARDIOVASCULAR: S1, S2 normal. No murmurs, rubs, or gallops.  ABDOMEN: Soft, nontender, nondistended. Bowel sounds present. No organomegaly or mass.  EXTREMITIES: No pedal edema,  cyanosis, or clubbing.  NEUROLOGIC: Cranial nerves II through XII are intact. Muscle strength 4/5 in all extremities. Sensation intact. Gait not checked.  PSYCHIATRIC: The patient is alert and oriented x 2.  SKIN: No obvious rash, lesion, or ulcer.   Physical Exam LABORATORY PANEL:   CBC Recent Labs  Lab 08/11/17 0052  WBC 15.1*  HGB 11.1*  HCT 31.9*  PLT 273   ------------------------------------------------------------------------------------------------------------------  Chemistries  Recent Labs  Lab 08/11/17 0052  NA 137  K 3.1*  CL 95*  CO2 32  GLUCOSE 111*  BUN 14  CREATININE 0.43*  CALCIUM 8.5*   ------------------------------------------------------------------------------------------------------------------  Cardiac Enzymes Recent Labs  Lab 08/10/17 1853 08/11/17 0051  TROPONINI 0.03* 0.05*   ------------------------------------------------------------------------------------------------------------------  RADIOLOGY:  Dg Chest 2 View  Result Date: 08/10/2017 CLINICAL DATA:  Nonproductive cough x3 days. Hx of A-fib, COPD, HTN. Former smoker. EXAM: CHEST - 2 VIEW COMPARISON:  07/20/2014 FINDINGS: The heart is enlarged. There are patchy infiltrates in the lung bases bilaterally. Small bilateral pleural effusions are present. There is mild pulmonary vascular congestion. Remote fracture of the LEFT clavicle. There is dense atherosclerotic calcification of the aorta. IMPRESSION: Cardiomegaly and bilateral airspace filling opacities/bilateral pleural effusions. Findings are consistent with pulmonary edema. Infectious process could have a similar appearance. Aortic atherosclerosis.  (ICD10-I70.0) Electronically Signed   By: Nolon Nations M.D.   On: 08/10/2017 13:45    ASSESSMENT AND PLAN:   Active Problems:   SOB (shortness of breath)  Patient is a 84 year old female brought in with cough  1.  Shortness of breath with hypoxia  pneumonia , possible  congestive heart failure  treat with IV Levaquin  Speech eval make sure her swallowing is okay  low-dose IV Lasix Obtain echo of the heart  2.  History of atrial fibrillation continue digoxin and Cardizem  3.  Essential hypertension continue lisinopril  4.  Alzheimer's dementia continue Aricept and Seroquel at nighttime  5.  Miscellaneous Lovenox for DVT prophylaxis  6. Hypokalemia   Replace as needed.  All the records are reviewed and case discussed with Care Management/Social Workerr. Management plans discussed with the patient, family and they are in agreement.  CODE STATUS: DNR.  TOTAL TIME TAKING CARE OF THIS PATIENT: 35 minutes.     POSSIBLE D/C IN 1-2 DAYS, DEPENDING ON CLINICAL CONDITION.   Vaughan Basta M.D on 08/11/2017   Between 7am to 6pm - Pager - [phone]  After 6pm go to [email or web address] - password [email or web address] [facility]  Office  [phone]  CC: Primary care physician; Virginia Crews, MD  Note: This dictation was prepared with Dragon dictation along with smaller phrase technology. Any transcriptional errors that result from this process are unintentional.

## 2017-08-11 NOTE — Evaluation (Signed)
Clinical/Bedside Swallow Evaluation Patient Details  Name: Patricia Foley MRN: [id number] Date of Birth: 01-27-1932  Today's Date: 08/11/2017 Time:    31 minutes (11:15-11:46)    Past Medical History:  Past Medical History:  Diagnosis Date  . Anemia   . Arthritis   . Atrial fibrillation ([place])   . Circulation problem   . COPD (chronic obstructive pulmonary disease) ([place])   . Cough    CHRONIC  . Dementia   . GERD (gastroesophageal reflux disease)   . HOH (hard of hearing)   . Hypertension   . Hypothyroidism   . Tremors of nervous system   . Venous insufficiency    Past Surgical History:  Past Surgical History:  Procedure Laterality Date  . ABDOMINAL HYSTERECTOMY     benign indication  . CATARACT EXTRACTION W/PHACO Right 05/04/2016   Procedure: CATARACT EXTRACTION PHACO AND INTRAOCULAR LENS PLACEMENT (IOC);  Surgeon: Birder Robson, MD;  Location: [facility] ORS;  Service: Ophthalmology;  Laterality: Right;  [place] 3:02.7 AP% 23.1 CDE 42.15 Fluid pack lot # [id number]  . CATARACT EXTRACTION W/PHACO Left 06/29/2016   Procedure: CATARACT EXTRACTION PHACO AND INTRAOCULAR LENS PLACEMENT (IOC);  Surgeon: Birder Robson, MD;  Location: [facility] ORS;  Service: Ophthalmology;  Laterality: Left;  [place] 2:53.3 AP% 27.7 CDE 48.16 Fluid Pack Lot # [id number]   HPI: Per admitting H&P  Patricia Foley is a 84 y.o. female with a history of anemia, arthritis, A. fib, COPD, dementia, hypertension who presents to the ED for cough since Sunday.  Cough is been nonproductive, she was found to be markedly hypoxic here on arrival with oxygen saturation in the mid 70s.  She does not normally take oxygen.  She has a history of dementia but otherwise denies complaints.       Assessment / Plan / Recommendation Clinical Impression  Patient presents with functional swallowing abilities at bedside, demonstrating with no s/s aspiration with any consistencies tested. Oral phase c/b intermittent oral holding  and delay of swallow initiation of sips of thin liquid, mild oral transit delay and min oral residue with solids, which easily cleared with sips of thin liquid.  Patient noted with a wet vocal quality prior to PO intake, which could be cleared briefly with strong throat clear or cough. Laryngeal elevation appeared adequate and vocal quality remained clear following all PO intake. Family reports patient tolerates a regular diet with thin liquids at home, eating well with no s/s aspiration with any PO intake. Did note occasional "bubbling or gurgling" heard post swallow, which could indicate possible retrograde flow from esophagus. Recommend continue with current diet with strict aspiration precautions secondary to severity of respiratory compromise. ST to follow to ensure toleration of current diet and provide education re: aspiration and safe swallow precautions. SLP Visit Diagnosis: Dysphagia, oropharyngeal phase (R13.12)    Aspiration Risk  Mild aspiration risk    Diet Recommendation     Medication Administration: Whole meds with liquid    Other  Recommendations Oral Care Recommendations: Oral care BID   Follow up Recommendations Other (comment)      Frequency and Duration min 2x/week  1 week       Prognosis Prognosis for Safe Diet Advancement: Good      Swallow Study   General Date of Onset: 08/11/17 Behavior/Cognition: Cooperative;Pleasant mood;Requires cueing;Distractible Oral Cavity - Dentition: Poor condition Baseline Vocal Quality: Hoarse;Wet;Other (comment)(wet prior to PO trials, able to mostly clear with cues ) Volitional Cough: Strong;Congested    Oral/Motor/Sensory Function Overall  Oral Motor/Sensory Function: Within functional limits   Ice Chips Ice chips: Within functional limits Presentation: Spoon   Thin Liquid Thin Liquid: Within functional limits Presentation: Cup;Straw;Self Fed    Nectar Thick     Honey Thick     Puree Puree: Within functional  limits Presentation: Spoon   Solid   GO   Solid: Within functional limits Presentation: Self Fed Other Comments: Mild increase oral prep time; however WFL patient cleared oral cavity well with minimal oral residue which easily cleared with sip of thin liquid.        [name] 08/11/2017,3:30 PM

## 2017-08-11 NOTE — Evaluation (Signed)
Physical Therapy Evaluation Patient Details Name: LAWANDA HOLZHEIMER MRN: [id number] DOB: May 17, 1931 Today's Date: 08/11/2017   History of Present Illness  Patient is a n 84 year old female admitted from [facility] for acute pulmonary edema and hypoxia following c/o SOB and cough.  PMH includes venouse insufficiency, tremors, dementia, COPD, arthritis and atrial fibrillation.  Clinical Impression  Patient is an 84 year old female who lives in the alzheimer's unit of an ALF.  Pt's granddaughter states that pt prefers to walk without the RW but that she does not feel that she is safe to do this.  Pt requires mod I for bed mobility and is able to perform STS without assistance.  Pt ambulated with RW 30 ft in room with close CGA and did not demonstrate LOB but presents with gait deviations indicative of fall risk.  Pt is severely kyphotic, affecting gait and standing balance.  Pt presents with overall weakness of UE and LE.  She is not disoriented and requires redirection and encouragement to continue with mobility.  Pt will continue to benefit from skilled PT with focus on strength, functional mobility, tolerance to activity and safe use of AD.    Follow Up Recommendations Home health PT;Supervision for mobility/OOB    Equipment Recommendations  None recommended by PT    Recommendations for Other Services       Precautions / Restrictions Precautions Precautions: Fall Restrictions Weight Bearing Restrictions: No      Mobility  Bed Mobility Overal bed mobility: Modified Independent             General bed mobility comments: Increased time and use of bed rail.  Transfers Overall transfer level: Modified independent               General transfer comment: Pt stands slowly without use of AD.  Ambulation/Gait Ambulation/Gait assistance: Min guard Gait Distance (Feet): 30 Feet Assistive device: Rolling walker (2 wheeled)     Gait velocity interpretation: <1.8 ft/sec,  indicate of risk for recurrent falls General Gait Details: Low foot clearance, decreased step length and kyphotic posture.  Pt able to use RW during turns.  Stairs            Wheelchair Mobility    Modified Rankin (Stroke Patients Only)       Balance Overall balance assessment: Modified Independent                                           Pertinent Vitals/Pain Pain Assessment: No/denies pain    Home Living Family/patient expects to be discharged to:: Assisted living               Home Equipment: Walker - 2 wheels Additional Comments: Pt's granddaughter states that pt has a RW but that she often refuses to use it.    Prior Function Level of Independence: Independent with assistive device(s)         Comments: Uses RW or furniture cruises.     Hand Dominance        Extremity/Trunk Assessment   Upper Extremity Assessment Upper Extremity Assessment: Generalized weakness    Lower Extremity Assessment Lower Extremity Assessment: Generalized weakness    Cervical / Trunk Assessment Cervical / Trunk Assessment: Kyphotic  Communication   Communication: No difficulties  Cognition Arousal/Alertness: Lethargic Behavior During Therapy: Restless Overall Cognitive Status: History of cognitive impairments - at  baseline                                 General Comments: Follows commands inconsistently.      General Comments      Exercises     Assessment/Plan    PT Assessment Patient needs continued PT services  PT Problem List Decreased strength;Decreased mobility;Decreased balance       PT Treatment Interventions DME instruction;Therapeutic activities;Gait training;Patient/family education;Therapeutic exercise;Stair training;Balance training;Functional mobility training;Neuromuscular re-education    PT Goals (Current goals can be found in the Care Plan section)  Acute Rehab PT Goals PT Goal Formulation: Patient  unable to participate in goal setting    Frequency Min 2X/week   Barriers to discharge        Co-evaluation               AM-PAC PT "6 Clicks" Daily Activity  Outcome Measure Difficulty turning over in bed (including adjusting bedclothes, sheets and blankets)?: A Little Difficulty moving from lying on back to sitting on the side of the bed? : A Little Difficulty sitting down on and standing up from a chair with arms (e.g., wheelchair, bedside commode, etc,.)?: A Little Help needed moving to and from a bed to chair (including a wheelchair)?: A Little Help needed walking in hospital room?: A Little Help needed climbing 3-5 steps with a railing? : A Little 6 Click Score: 18    End of Session Equipment Utilized During Treatment: Gait belt;Oxygen Activity Tolerance: Patient limited by fatigue Patient left: in bed;with bed alarm set;with call bell/phone within reach;with family/visitor present   PT Visit Diagnosis: Unsteadiness on feet (R26.81);Muscle weakness (generalized) (M62.81)    Time: 1430-1450 PT Time Calculation (min) (ACUTE ONLY): 20 min   Charges:   PT Evaluation $PT Eval Low Complexity: 1 Low     PT G Codes:   PT G-Codes **NOT FOR INPATIENT CLASS** Functional Assessment Tool Used: AM-PAC 6 Clicks Basic Mobility    Roxanne Gates, PT, DPT   Roxanne Gates 08/11/2017, 3:29 PM

## 2017-08-11 NOTE — Clinical Social Work Note (Signed)
CSW spoke with Patricia Foley at [facility] and confirmed patient is from the Memory care unit at ALF.  CSW to continue to follow patient's progress and coordinate discharge planning.  Jones Broom. [place], MSW, [place]  08/11/2017 3:32 PM

## 2017-08-11 NOTE — Progress Notes (Signed)
0440 : Patient O2 level dropped to 82% on 5L of supplemental oxygen. Patient was placed on non rebreather for about 30 minutes, PRN ned treatment and cough med were administered.  0515: Patient was weaned back to 3L of supplemental oxygen, O2 level now on 93%.   Patient grand son in-law was at the bedside overnight. Patient has couple of impulsive episodes and was easy to redirect.

## 2017-08-11 NOTE — Plan of Care (Signed)

## 2017-08-11 NOTE — Progress Notes (Signed)
*  PRELIMINARY RESULTS* Echocardiogram 2D Echocardiogram has been performed.  Patricia Foley 08/11/2017, 1:27 PM

## 2017-08-12 ENCOUNTER — Inpatient Hospital Stay: Payer: Medicare HMO

## 2017-08-12 MED ORDER — FUROSEMIDE 10 MG/ML IJ SOLN
20.0000 mg | Freq: Every day | INTRAMUSCULAR | Status: DC
  Administered 2017-08-13 – 2017-08-14 (×2): 20 mg via INTRAVENOUS
  Filled 2017-08-12 (×2): qty 2

## 2017-08-12 NOTE — Progress Notes (Signed)
[facility] NAME: Patricia Foley    MR#:  [id number]  DATE OF BIRTH:  09-30-1931  SUBJECTIVE:  CHIEF COMPLAINT:   Chief Complaint  Patient presents with  . Cough   Came in with cough and shortness of breath for last few days. Feels little better now.  Still on oxygen.  REVIEW OF SYSTEMS:  CONSTITUTIONAL: No fever, fatigue or weakness.  EYES: No blurred or double vision.  EARS, NOSE, AND THROAT: No tinnitus or ear pain.  RESPIRATORY: have cough, shortness of breath, no wheezing or hemoptysis.  CARDIOVASCULAR: No chest pain, orthopnea, edema.  GASTROINTESTINAL: No nausea, vomiting, diarrhea or abdominal pain.  GENITOURINARY: No dysuria, hematuria.  ENDOCRINE: No polyuria, nocturia,  HEMATOLOGY: No anemia, easy bruising or bleeding SKIN: No rash or lesion. MUSCULOSKELETAL: No joint pain or arthritis.   NEUROLOGIC: No tingling, numbness, weakness.  PSYCHIATRY: No anxiety or depression.   ROS  DRUG ALLERGIES:   Allergies  Allergen Reactions  . Sulfa Antibiotics Hives  . Coumadin [Warfarin] Rash    VITALS:  Blood pressure (!) 139/57, pulse 65, temperature 98.3 F (36.8 C), temperature source Oral, resp. rate 17, height 5\' 2"  (1.575 m), weight 63 kg (138 lb 12.8 oz), SpO2 93 %.  PHYSICAL EXAMINATION:  GENERAL:  84 y.o.-year-old patient lying in the bed with no acute distress.  EYES: Pupils equal, round, reactive to light and accommodation. No scleral icterus. Extraocular muscles intact.  HEENT: Head atraumatic, normocephalic. Oropharynx and nasopharynx clear.  NECK:  Supple, no jugular venous distention. No thyroid enlargement, no tenderness.  LUNGS: Normal breath sounds bilaterally, no wheezing, some crepitation. No use of accessory muscles of respiration.  CARDIOVASCULAR: S1, S2 normal. No murmurs, rubs, or gallops.  ABDOMEN: Soft, nontender, nondistended. Bowel sounds present. No organomegaly or mass.  EXTREMITIES:  No pedal edema, cyanosis, or clubbing.  NEUROLOGIC: Cranial nerves II through XII are intact. Muscle strength 4/5 in all extremities. Sensation intact. Gait not checked.  PSYCHIATRIC: The patient is alert and oriented x 2.  SKIN: No obvious rash, lesion, or ulcer.   Physical Exam LABORATORY PANEL:   CBC Recent Labs  Lab 08/11/17 0052  WBC 15.1*  HGB 11.1*  HCT 31.9*  PLT 273   ------------------------------------------------------------------------------------------------------------------  Chemistries  Recent Labs  Lab 08/11/17 0052  NA 137  K 3.1*  CL 95*  CO2 32  GLUCOSE 111*  BUN 14  CREATININE 0.43*  CALCIUM 8.5*   ------------------------------------------------------------------------------------------------------------------  Cardiac Enzymes Recent Labs  Lab 08/10/17 1853 08/11/17 0051  TROPONINI 0.03* 0.05*   ------------------------------------------------------------------------------------------------------------------  RADIOLOGY:  Dg Chest 2 View  Result Date: 08/12/2017 CLINICAL DATA:  Hypoxia today. Hx of A-fib, COPD, HTN. EXAM: CHEST - 2 VIEW COMPARISON:  08/10/2017 and earlier FINDINGS: The heart is mildly enlarged. There are patchy opacities in the RIGHT UPPER lobe and RIGHT LOWER lobe and to lesser degree the LEFT LOWER lobe. There are small bilateral pleural effusions. The thoracic aorta is densely calcified. Remote LEFT clavicle fracture. IMPRESSION: Persistent bilateral infiltrates consistent with edema and/or infectious process. Electronically Signed   By: Nolon Nations M.D.   On: 08/12/2017 12:32    ASSESSMENT AND PLAN:   Active Problems:   SOB (shortness of breath)  Patient is a 84 year old female brought in with cough  1.  Shortness of breath with hypoxia- ac respi failure.  pneumonia , ac diastolic congestive heart failure  treat with IV Levaquin Speech eval make sure her swallowing is  okay  low-dose IV Lasix reviewed echo of  the heart  taper oxygen.  2.  History of atrial fibrillation continue digoxin and Cardizem  3.  Essential hypertension continue lisinopril  4.  Alzheimer's dementia continue Aricept and Seroquel at nighttime  5.  Miscellaneous Lovenox for DVT prophylaxis  6. Hypokalemia   Replace as needed.  All the records are reviewed and case discussed with Care Management/Social Workerr. Management plans discussed with the patient, family and they are in agreement.  CODE STATUS: DNR.  TOTAL TIME TAKING CARE OF THIS PATIENT: 35 minutes.   Still on oxygen today. Try to taper.  POSSIBLE D/C IN 1-2 DAYS, DEPENDING ON CLINICAL CONDITION.   Vaughan Basta M.D on 08/12/2017   Between 7am to 6pm - Pager - [phone]  After 6pm go to [email or web address] - password [email or web address] [facility]  Office  [phone]  CC: Primary care physician; Virginia Crews, MD  Note: This dictation was prepared with Dragon dictation along with smaller phrase technology. Any transcriptional errors that result from this process are unintentional.

## 2017-08-12 NOTE — Care Management (Signed)
Notified Sarah with [facility] of  need for physical therapy at the facility at discharge.

## 2017-08-12 NOTE — Clinical Social Work Note (Signed)
Clinical Social Work Assessment  Patient Details  Name: Patricia Foley MRN: [id number] Date of Birth: 08-04-1931  Date of referral:  08/12/17               Reason for consult:  Facility Placement                Permission sought to share information with:  Family Supports, Customer service manager Permission granted to share information::  Yes, Verbal Permission Granted  Name::     Merton Border Niece [phone]  [phone] or Konrad Dolores [phone]   Agency::  [facility].  Relationship::     Contact Information:     Housing/Transportation Living arrangements for the past 2 months:  Assisted Living Facility([facility]) Source of Information:  Medical Team, Facility Patient Interpreter Needed:  None Criminal Activity/Legal Involvement Pertinent to Current Situation/Hospitalization:  No - Comment as needed Significant Relationships:  Other Family Members Lives with:  Facility Resident Do you feel safe going back to the place where you live?  Yes Need for family participation in patient care:  Yes (Comment)  Care giving concerns:  ALF did not express any concerns about patient returning back to ALF over the weekend.   Social Worker assessment / plan:  Patient is an 84 year old female who is alert and oriented x1.  Patient is a resident at [facility].  Patient and her family were sleeping unable to complete assessment by speaking with patient or her family member, assessment completed by speaking with ALF and reviewing medical chart.  Patient has been at ALF for just over a year, the family did not report any concerns about patient returning to ALF.  Patient is private pay at [facility], PT is recommending home health, CSW spoke to [facility] and they can accept patient back once she is medically ready for discharge and orders have been received.  CSW will contact ALF to facilitate discharge planning back to ALF.   No  other concerns reported to nurse by family or ALF.   Employment status:  Retired Nurse, adult PT Recommendations:  Home with [facility] / Referral to community resources:     Patient/Family's Response to care:  Patient's family agreeable to having her return back to ALF.  Patient/Family's Understanding of and Emotional Response to Diagnosis, Current Treatment, and Prognosis:  Patient and family were sleeping, but did report to nurse they are hopeful she will be able to get weaned off of oxygen.  Emotional Assessment Appearance:  Appears stated age Attitude/Demeanor/Rapport:    Affect (typically observed):  Appropriate, Stable Orientation:  Oriented to Self Alcohol / Substance use:  Not Applicable Psych involvement (Current and /or in the community):  No (Comment)  Discharge Needs  Concerns to be addressed:  Cognitive Concerns, Care Coordination Readmission within the last 30 days:  No Current discharge risk:  None Barriers to Discharge:  Continued Medical Work up   Anell Barr 08/12/2017, 5:45 PM

## 2017-08-12 NOTE — Progress Notes (Signed)
Pharmacy Antibiotic Note  Patricia Foley is a 84 y.o. female admitted on 08/10/2017 with pneumonia.  Pharmacy has been consulted for Levaquin dosing.  Plan: Levaquin 750 mg iv q 48 hours.   Height: 5\' 2"  (157.5 cm) Weight: 138 lb 12.8 oz (63 kg) IBW/kg (Calculated) : 50.1  Temp (24hrs), Avg:98.2 F (36.8 C), Min:97.4 F (36.3 C), Max:98.6 F (37 C)  Recent Labs  Lab 08/10/17 1238 08/10/17 1611 08/11/17 0052  WBC 14.1* 12.9* 15.1*  CREATININE 0.62 0.67 0.43*    Estimated Creatinine Clearance: 45.7 mL/min (A) (by C-G formula based on SCr of 0.43 mg/dL (L)).    Allergies  Allergen Reactions  . Sulfa Antibiotics Hives  . Coumadin [Warfarin] Rash    Antimicrobials this admission: Levaquin 6/26 >>   Dose adjustments this admission:   Microbiology results: 6/26 MRSA PCR: neg  Thank you for allowing pharmacy to be a part of this patient's care.  Rocky Morel 08/12/2017 1:32 PM

## 2017-08-13 LAB — CBC
HCT: 34.1 % — ABNORMAL LOW (ref 35.0–47.0)
Hemoglobin: 11.7 g/dL — ABNORMAL LOW (ref 12.0–16.0)
MCH: 30.6 pg (ref 26.0–34.0)
MCHC: 34.2 g/dL (ref 32.0–36.0)
MCV: 89.5 fL (ref 80.0–100.0)
PLATELETS: 323 10*3/uL (ref 150–440)
RBC: 3.81 MIL/uL (ref 3.80–5.20)
RDW: 12.9 % (ref 11.5–14.5)
WBC: 11.6 10*3/uL — AB (ref 3.6–11.0)

## 2017-08-13 NOTE — Plan of Care (Signed)
  Problem: Education: Goal: Knowledge of General Education information will improve Outcome: Progressing   Problem: Clinical Measurements: Goal: Respiratory complications will improve Outcome: Progressing   Problem: Activity: Goal: Risk for activity intolerance will decrease Outcome: Progressing   Problem: Safety: Goal: Ability to remain free from injury will improve Outcome: Progressing

## 2017-08-13 NOTE — Progress Notes (Signed)
Physical Therapy Treatment Patient Details Name: Patricia Foley MRN: [id number] DOB: Jul 23, 1931 Today's Date: 08/13/2017    History of Present Illness Patient is a n 84 year old female admitted from [facility] for acute pulmonary edema and hypoxia following c/o SOB and cough.  PMH includes venouse insufficiency, tremors, dementia, COPD, arthritis and atrial fibrillation.    PT Comments    Pt on commode upon arrival with no results.  Was able to stand and transfer to bed with min guard.   Once sitting EOB she had frequent post LOB/lean and required min verbal and tactile cues to remain upright.  May have been attributed to pt thinking she was going to lay back down and her initial resistance to walk.  She was able to stand from bed with min guard and ambulate around unit x 1 with walker and min guard.  O2 at 3lpm.  Sats monitored and fluctuated between 87-91%% during gait.  Pt overall appeared comfortable and without distress.  Upon return to room she was able to walk back to recliner without AD and min guard.    Discussed with daughter.  Pt will continue to need O2 at discharge.  She has a rollator at home which is appropriate for her given her balance with gait.  She will need assist with cord and O2 management which daughter stated facility should be able to provide.     Follow Up Recommendations  Home health PT;Supervision for mobility/OOB     Equipment Recommendations  None recommended by PT    Recommendations for Other Services       Precautions / Restrictions Precautions Precautions: Fall Restrictions Weight Bearing Restrictions: No    Mobility  Bed Mobility               General bed mobility comments: oob and remained up after session  Transfers Overall transfer level: Needs assistance Equipment used: Rolling walker (2 wheeled) Transfers: Sit to/from Stand Sit to Stand: Supervision;Min guard            Ambulation/Gait Ambulation/Gait assistance: Min  guard Gait Distance (Feet): 160 Feet Assistive device: Rolling walker (2 wheeled)     Gait velocity interpretation: <1.8 ft/sec, indicate of risk for recurrent falls General Gait Details: Generally steady but does keep walker too far ahead.  Pt has rollator at home which she uses occasionally which may explain RW positioning.   Stairs             Wheelchair Mobility    Modified Rankin (Stroke Patients Only)       Balance Overall balance assessment: Modified Independent;Needs assistance Sitting-balance support: Feet supported Sitting balance-Leahy Scale: Poor Sitting balance - Comments: Pt with post lean in sitting and min verbal and tactile cues to remain upright.  Imporved some with bed position adjustments.   Standing balance support: No upper extremity supported Standing balance-Leahy Scale: Fair Standing balance comment: able to walk in room with no AD and without LOB                            Cognition Arousal/Alertness: Awake/alert Behavior During Therapy: WFL for tasks assessed/performed Overall Cognitive Status: History of cognitive impairments - at baseline                                        Exercises  General Comments        Pertinent Vitals/Pain Pain Assessment: No/denies pain    Home Living                      Prior Function            PT Goals (current goals can now be found in the care plan section) Progress towards PT goals: Progressing toward goals    Frequency    Min 2X/week      PT Plan Current plan remains appropriate    Co-evaluation              AM-PAC PT "6 Clicks" Daily Activity  Outcome Measure  Difficulty turning over in bed (including adjusting bedclothes, sheets and blankets)?: A Little Difficulty moving from lying on back to sitting on the side of the bed? : A Little Difficulty sitting down on and standing up from a chair with arms (e.g., wheelchair, bedside  commode, etc,.)?: A Little Help needed moving to and from a bed to chair (including a wheelchair)?: A Little Help needed walking in hospital room?: A Little Help needed climbing 3-5 steps with a railing? : A Little 6 Click Score: 18    End of Session Equipment Utilized During Treatment: Gait belt;Oxygen Activity Tolerance: Patient tolerated treatment well Patient left: in chair;with chair alarm set;with call bell/phone within reach;with family/visitor present         Time: 2229-7989 PT Time Calculation (min) (ACUTE ONLY): 24 min  Charges:  $Gait Training: 8-22 mins $Therapeutic Activity: 8-22 mins                    G Codes:       Chesley Noon, PTA 08/13/17, 9:35 AM

## 2017-08-13 NOTE — Progress Notes (Signed)
  Speech Language Pathology Treatment: Dysphagia  Patient Details Name: Patricia Foley MRN: [id number] DOB: [birth year] Today's Date: 08/13/2017 Time: 9528-4132 SLP Time Calculation (min) (ACUTE ONLY): 40 min  Assessment / Plan / Recommendation Clinical Impression  Pt seen for ongoing assessment and toleration of oral intake w/ current Dysphagia level 3(mech soft) diet and thin liquids. NSG reported no difficulty w/ swallowing. Pt has a baseline of Dementia; confusion. She requires moderate+ verbal/tactile cues during session for follow through w/ all tasks.  Pt given po trials of thin liquids and purees during this session as she stated she was "not hungry". She appeared to present w/ functional swallowing abilities demonstrating no overt s/s aspiration w/ any consistencies tested. Oral phase c/b mild oral transit delay x2 w/ sips of thin liquids but cleared appropriately given time. Large jug straw was removed and standard straw presented w/ water trials. Pt noted with a min wet vocal quality prior to PO intake, as well as post swallowing and BELCHING, which cleared w/ throat clear or f/u, dry swallow. Laryngeal elevation appeared adequate and no further decline in vocal quality or respiratory status as trials continued. W/ the noted occasional "bubbling or gurgling" heard post swallow and BELCHING, this could indicate possible Esophageal phase dysmotility and retrograde flow from Esophagus. Any such presentation could increase risk for aspiration of REFLUX material thus impacting the Pulmonary status. Recommend continue with current Mech Soft diet w/ thin liquids w/ strict aspiration precautions and monitoring/Supervision at meals; PIlls in Puree as needed for safer, easier swallowing. ST services will continue to monitor and f/u for family/pt education while admitted.     HPI HPI: Pt is a 84 y.o. female with a known history of GERD, HOH, osteoarthritis, atrial fibrillation, COPD, Dementia,  essential hypertension and hypothyroidism who is currently residing in Alzheimer's unit at [facility] assisted living.  Patient is brought in with complaint of cough and shortness of breath.  In the ER she is noted to have hypoxia and is needing oxygen.  Her grandson and healthcare power of attorney is bedside states that the symptoms started recently  Pt has been tolerating her current dysphagia level 3(mech soft foods) w/ thin liquids diet per NSG report.       SLP Plan  Continue with current plan of care; education w/ pt/family       Recommendations  Diet recommendations: Dysphagia 3 (mechanical soft);Thin liquid Liquids provided via: Cup;Straw(monitor - no big straws) Medication Administration: Whole meds with puree(for easier, safer swallowing as needed d/t Cognition) Supervision: Staff to assist with self feeding;Full supervision/cueing for compensatory strategies Compensations: Minimize environmental distractions;Slow rate;Small sips/bites;Lingual sweep for clearance of pocketing;Multiple dry swallows after each bite/sip;Follow solids with liquid Postural Changes and/or Swallow Maneuvers: Seated upright 90 degrees;Upright 30-60 min after meal                General recommendations: (Dietician f/u) Oral Care Recommendations: Oral care BID;Staff/trained caregiver to provide oral care Follow up Recommendations: (TBD) SLP Visit Diagnosis: Dysphagia, pharyngoesophageal phase (R13.14)(increased belching; GERD baseline) Plan: Continue with current plan of care       [place], MS, CCC-SLP Watson,Katherine 08/13/2017, 11:59 AM

## 2017-08-13 NOTE — Progress Notes (Signed)
MD requested to wean the patient from O2. O2 was lowered to 3L (from 4L) on Friday.  The patient dropped some, but stayed above the 89% the MD requested, so RN lowered the O2 again to 2L.  Patient went down in the low 80s while being still.  RN raised the O2 back to 3L.   Patient will need O2 @ 3L when she is discharged.  Phillis Knack, RN

## 2017-08-13 NOTE — Progress Notes (Signed)
[facility] [place] NAME: Patricia Foley    MR#:  [id number]  DATE OF BIRTH:  August 02, 1931  SUBJECTIVE:   Here due to shortness of breath, cough and noted to be in acute respiratory failure with hypoxia secondary to CHF.  Improving with diuresis.  Still has a mild congestive cough.  Patient's granddaughter is at bedside.  REVIEW OF SYSTEMS:    Review of Systems  Constitutional: Negative for chills and fever.  HENT: Negative for congestion and tinnitus.   Eyes: Negative for blurred vision and double vision.  Respiratory: Positive for cough and shortness of breath. Negative for wheezing.   Cardiovascular: Negative for chest pain, orthopnea and PND.  Gastrointestinal: Negative for abdominal pain, diarrhea, nausea and vomiting.  Genitourinary: Negative for dysuria and hematuria.  Neurological: Negative for dizziness, sensory change and focal weakness.  All other systems reviewed and are negative.   Nutrition: Dysphagia III, Thin liquids Tolerating Diet: Yes Tolerating PT: Eval noted.   DRUG ALLERGIES:   Allergies  Allergen Reactions  . Sulfa Antibiotics Hives  . Coumadin [Warfarin] Rash    VITALS:  Blood pressure (!) 144/66, pulse 74, temperature 98.3 F (36.8 C), temperature source Oral, resp. rate 18, height 5\' 2"  (1.575 m), weight 60.2 kg (132 lb 11.2 oz), SpO2 93 %.  PHYSICAL EXAMINATION:   Physical Exam  GENERAL:  84 y.o.-year-old patient sitting up in chair but in no acute distress.  EYES: Pupils equal, round, reactive to light and accommodation. No scleral icterus. Extraocular muscles intact.  HEENT: Head atraumatic, normocephalic. Oropharynx and nasopharynx clear.  NECK:  Supple, no jugular venous distention. No thyroid enlargement, no tenderness.  LUNGS: Normal breath sounds bilaterally, + rhonchi, rales at bases b/l. No use of accessory muscles of respiration.  CARDIOVASCULAR: S1, S2 normal. No murmurs, rubs, or  gallops.  ABDOMEN: Soft, nontender, nondistended. Bowel sounds present. No organomegaly or mass.  EXTREMITIES: No cyanosis, clubbing or edema b/l.    NEUROLOGIC: Cranial nerves II through XII are intact. No focal Motor or sensory deficits b/l.  Globally weak PSYCHIATRIC: The patient is alert and oriented x 1. SKIN: No obvious rash, lesion, or ulcer.    LABORATORY PANEL:   CBC Recent Labs  Lab 08/13/17 0732  WBC 11.6*  HGB 11.7*  HCT 34.1*  PLT 323   ------------------------------------------------------------------------------------------------------------------  Chemistries  Recent Labs  Lab 08/11/17 0052  NA 137  K 3.1*  CL 95*  CO2 32  GLUCOSE 111*  BUN 14  CREATININE 0.43*  CALCIUM 8.5*   ------------------------------------------------------------------------------------------------------------------  Cardiac Enzymes Recent Labs  Lab 08/11/17 0051  TROPONINI 0.05*   ------------------------------------------------------------------------------------------------------------------  RADIOLOGY:  Dg Chest 2 View  Result Date: 08/12/2017 CLINICAL DATA:  Hypoxia today. Hx of A-fib, COPD, HTN. EXAM: CHEST - 2 VIEW COMPARISON:  08/10/2017 and earlier FINDINGS: The heart is mildly enlarged. There are patchy opacities in the RIGHT UPPER lobe and RIGHT LOWER lobe and to lesser degree the LEFT LOWER lobe. There are small bilateral pleural effusions. The thoracic aorta is densely calcified. Remote LEFT clavicle fracture. IMPRESSION: Persistent bilateral infiltrates consistent with edema and/or infectious process. Electronically Signed   By: Nolon Nations M.D.   On: 08/12/2017 12:32     ASSESSMENT AND PLAN:   84 year old female with past medical history of Alzheimer's dementia, chronic atrial fibrillation, hypertension, COPD who presented to the hospital due to shortness of breath.  1.  Acute respiratory failure with hypoxia-secondary to underlying mild CHF combined  with underlying COPD/Pneumonia. - Continue diuresis with IV Lasix, duonebs, IV Levaquin for pneumonia.  -She is not on oxygen at home but currently requiring at least 3 L of oxygen. -We will repeat chest x-ray tomorrow.  2.  CHF-acute on chronic diastolic dysfunction. -Continue diuresis with IV Lasix, follow I's and O daily weights.  Patient is responding and doing better. -Continue lisinopril  3.  History of chronic atrial fibrillation-rate controlled.  Continue digoxin, Cardizem. - Not on long-term anticoagulation due to high fall risk.  4.  Dementia-continue Aricept.  5.  Hypokalemia-we will continue supplement, repeat level in the morning.  Possible d/c to Assisted Living with Home health upon discharge.    All the records are reviewed and case discussed with Care Management/Social Worker. Management plans discussed with the patient, family and they are in agreement.  CODE STATUS: DNR  DVT Prophylaxis: Lovenox  TOTAL TIME TAKING CARE OF THIS PATIENT: 30 minutes.   POSSIBLE D/C IN 1-2 DAYS, DEPENDING ON CLINICAL CONDITION.   [name] M.D on 08/13/2017 at 1:49 PM  Between 7am to 6pm - Pager - [phone]  After 6pm go to [email or web address] - Proofreader  [facility]  Office  [phone]  CC: Primary care physician; Virginia Crews, MD

## 2017-08-14 ENCOUNTER — Inpatient Hospital Stay: Payer: Medicare HMO

## 2017-08-14 LAB — CBC
HCT: 36.2 % (ref 35.0–47.0)
Hemoglobin: 12.2 g/dL (ref 12.0–16.0)
MCH: 30 pg (ref 26.0–34.0)
MCHC: 33.6 g/dL (ref 32.0–36.0)
MCV: 89.3 fL (ref 80.0–100.0)
PLATELETS: 342 10*3/uL (ref 150–440)
RBC: 4.05 MIL/uL (ref 3.80–5.20)
RDW: 12.9 % (ref 11.5–14.5)
WBC: 10.7 10*3/uL (ref 3.6–11.0)

## 2017-08-14 LAB — BASIC METABOLIC PANEL
Anion gap: 8 (ref 5–15)
BUN: 12 mg/dL (ref 8–23)
CALCIUM: 8.9 mg/dL (ref 8.9–10.3)
CHLORIDE: 95 mmol/L — AB (ref 98–111)
CO2: 34 mmol/L — ABNORMAL HIGH (ref 22–32)
CREATININE: 0.47 mg/dL (ref 0.44–1.00)
GFR calc Af Amer: >60 mL/min (ref >60)
GFR calc non Af Amer: >60 mL/min (ref >60)
Glucose, Bld: 110 mg/dL — ABNORMAL HIGH (ref 70–99)
Potassium: 3.9 mmol/L (ref 3.5–5.1)
Sodium: 137 mmol/L (ref 135–145)

## 2017-08-14 MED ORDER — IPRATROPIUM-ALBUTEROL 0.5-2.5 (3) MG/3ML IN SOLN: 3.0000 mL | Freq: Four times a day (QID) | RESPIRATORY_TRACT | 0 refills | Status: DC | PRN

## 2017-08-14 MED ORDER — LEVOFLOXACIN 250 MG PO TABS: 250.0000 mg | ORAL_TABLET | ORAL | 0 refills | Status: DC

## 2017-08-14 MED ORDER — BISACODYL 10 MG RE SUPP
10.0000 mg | Freq: Once | RECTAL | Status: DC
  Filled 2017-08-14: qty 1

## 2017-08-14 NOTE — Discharge Summary (Signed)
[facility] NAME: Patricia Foley    MR#:  [id number]  DATE OF BIRTH:  02-Oct-1931  DATE OF ADMISSION:  08/10/2017 ADMITTING PHYSICIAN: Dustin Flock, MD  DATE OF DISCHARGE: 08/14/2017  PRIMARY CARE PHYSICIAN: Virginia Crews, MD    ADMISSION DIAGNOSIS:  Acute pulmonary edema ([place]) [J81.0] Hypoxia [R09.02]  DISCHARGE DIAGNOSIS:  Active Problems:   SOB (shortness of breath)   SECONDARY DIAGNOSIS:   Past Medical History:  Diagnosis Date  . Anemia   . Arthritis   . Atrial fibrillation ([place])   . Circulation problem   . COPD (chronic obstructive pulmonary disease) ([place])   . Cough    CHRONIC  . Dementia   . GERD (gastroesophageal reflux disease)   . HOH (hard of hearing)   . Hypertension   . Hypothyroidism   . Tremors of nervous system   . Venous insufficiency     HOSPITAL COURSE:   84 year old female with past medical history of Alzheimer's dementia, chronic atrial fibrillation, hypertension, COPD who presented to the hospital due to shortness of breath.  1.  Acute respiratory failure with hypoxia-secondary to underlying mild CHF combined with underlying COPD/Pneumonia. -Patient was diuresed with IV Lasix, she was given IV Levaquin for suspected pneumonia and also given some duo nebs.  She has improved. - She remains somewhat hypoxic and therefore qualifies for home oxygen.  She is now being discharged back on her home dose oral Lasix along with Levaquin for a few more days. - She would also be discharged with home health nursing services and also with a nebulizer machine and duo nebs as needed.  Chest x-ray this morning on the day of discharge showed no worsening of her pneumonia/pulmonary edema. - pt. Will be discharged on 2L [place] continuous.    2.  CHF-acute on chronic diastolic dysfunction. -Improved with IV Lasix and will resume patient's oral Lasix upon discharge. -Patient will continue her  lisinopril.  3.  History of chronic atrial fibrillation-rate controlled.  She will Continue digoxin, Cardizem. - Not on long-term anticoagulation due to high fall risk.  4.  Dementia- she will continue Aricept.  5.  Hypokalemia- improved and resolved w/ supplementation.    Discharge to assisted living with home health nursing, physical therapy.  DISCHARGE CONDITIONS:   Stable.   CONSULTS OBTAINED:    DRUG ALLERGIES:   Allergies  Allergen Reactions  . Sulfa Antibiotics Hives  . Coumadin [Warfarin] Rash    DISCHARGE MEDICATIONS:   Allergies as of 08/14/2017      Reactions   Sulfa Antibiotics Hives   Coumadin [warfarin] Rash      Medication List    STOP taking these medications   mupirocin ointment 2 % Commonly known as:  BACTROBAN   naproxen 500 MG tablet Commonly known as:  NAPROSYN     TAKE these medications   acetaminophen 325 MG tablet Commonly known as:  TYLENOL Take 2 tablets (650 mg total) by mouth every 6 (six) hours as needed for mild pain (or Fever >/= 101).   albuterol 108 (90 Base) MCG/ACT inhaler Commonly known as:  PROVENTIL HFA;VENTOLIN HFA Inhale 2 puffs into the lungs every 6 (six) hours as needed for wheezing or shortness of breath.   aspirin 81 MG EC tablet TAKE 1 TABLET BY MOUTH EVERY DAY   CALCIUM 600+D 600-200 MG-UNIT Tabs Generic drug:  Calcium Carbonate-Vitamin D Take 1 tablet by mouth daily.   COLACE PO Take 50 mg  by mouth at bedtime.   digoxin 0.125 MG tablet Commonly known as:  LANOXIN Take 1 tablet (0.125 mg total) daily by mouth.   diltiazem 300 MG 24 hr capsule Commonly known as:  TIAZAC Take 1 capsule (300 mg total) by mouth daily.   donepezil 10 MG tablet Commonly known as:  ARICEPT Take 20 mg by mouth daily.   ferrous sulfate 325 (65 FE) MG tablet Take 325 mg by mouth daily with breakfast.   fluticasone 50 MCG/ACT nasal spray Commonly known as:  FLONASE Place 1 spray into the nose daily.   furosemide 40  MG tablet Commonly known as:  LASIX Take 40 mg by mouth daily.   ipratropium-albuterol 0.5-2.5 (3) MG/3ML Soln Commonly known as:  DUONEB Take 3 mLs by nebulization every 6 (six) hours as needed.   levofloxacin 250 MG tablet Commonly known as:  LEVAQUIN Take 1 tablet (250 mg total) by mouth every other day for 5 doses.   lisinopril 5 MG tablet Commonly known as:  PRINIVIL,ZESTRIL Take 1 tablet (5 mg total) by mouth daily.   MUCINEX FAST-MAX 10-650-400 MG/20ML Liqd Generic drug:  Phenylephrine-APAP-guaiFENesin Take 20 mLs by mouth every 12 (twelve) hours as needed (COUGH,CONGESTION).   potassium chloride SA 20 MEQ tablet Commonly known as:  K-DUR,KLOR-CON Take 1 tablet (20 mEq total) by mouth daily.   PROCTOSOL HC 2.5 % rectal cream Generic drug:  hydrocortisone Apply 1 application topically 3 (three) times daily.   QUEtiapine 25 MG tablet Commonly known as:  SEROQUEL Take 1 tablet by mouth daily.   triamcinolone cream 0.1 % Commonly known as:  KENALOG Apply 1 application topically 2 (two) times daily. Due to facial picking            Durable Medical Equipment  (From admission, onward)        Start     Ordered   08/14/17 1349  For home use only DME oxygen  Once    Question Answer Comment  Mode or (Route) Nasal cannula   Liters per Minute 2   Frequency Continuous (stationary and portable oxygen unit needed)   Oxygen conserving device Yes   Oxygen delivery system Gas      08/14/17 1350   08/14/17 0000  DME Nebulizer machine    Question:  Patient needs a nebulizer to treat with the following condition  Answer:  COPD (chronic obstructive pulmonary disease) ([place])   08/14/17 1350        DISCHARGE INSTRUCTIONS:   DIET:  Cardiac diet  DISCHARGE CONDITION:  Stable  ACTIVITY:  Activity as tolerated  OXYGEN:  Home Oxygen: No.   Oxygen Delivery: room air  DISCHARGE LOCATION:  Assisted Living w/ Home Health PT, RN.    If you experience worsening of  your admission symptoms, develop shortness of breath, life threatening emergency, suicidal or homicidal thoughts you must seek medical attention immediately by calling 911 or calling your MD immediately  if symptoms less severe.  You Must read complete instructions/literature along with all the possible adverse reactions/side effects for all the Medicines you take and that have been prescribed to you. Take any new Medicines after you have completely understood and accpet all the possible adverse reactions/side effects.   Please note  You were cared for by a hospitalist during your hospital stay. If you have any questions about your discharge medications or the care you received while you were in the hospital after you are discharged, you can call the unit and asked to  speak with the hospitalist on call if the hospitalist that took care of you is not available. Once you are discharged, your primary care physician will handle any further medical issues. Please note that NO REFILLS for any discharge medications will be authorized once you are discharged, as it is imperative that you return to your primary care physician (or establish a relationship with a primary care physician if you do not have one) for your aftercare needs so that they can reassess your need for medications and monitor your lab values.     Today   Shortness of breath improved since admission.  Still requiring some oxygen.  No other acute events overnight.  Family at bedside.  Will discharge to assisted living with home health services.  VITAL SIGNS:  Blood pressure (!) 157/69, pulse 72, temperature 98.8 F (37.1 C), temperature source Oral, resp. rate 20, height 5\' 2"  (1.575 m), weight 61.7 kg (136 lb), SpO2 92 %.  I/O:    Intake/Output Summary (Last 24 hours) at 08/14/2017 1350 Last data filed at 08/14/2017 0750 Gross per 24 hour  Intake -  Output 950 ml  Net -950 ml    PHYSICAL EXAMINATION:   GENERAL:  84  y.o.-year-old patient lying in bed but in no acute distress.  EYES: Pupils equal, round, reactive to light and accommodation. No scleral icterus. Extraocular muscles intact.  HEENT: Head atraumatic, normocephalic. Oropharynx and nasopharynx clear.  NECK:  Supple, no jugular venous distention. No thyroid enlargement, no tenderness.  LUNGS: Normal breath sounds bilaterally, + rhonch, rales at bases b/l. No use of accessory muscles of respiration.  CARDIOVASCULAR: S1, S2 normal. No murmurs, rubs, or gallops.  ABDOMEN: Soft, nontender, nondistended. Bowel sounds present. No organomegaly or mass.  EXTREMITIES: No cyanosis, clubbing or edema b/l.    NEUROLOGIC: Cranial nerves II through XII are intact. No focal Motor or sensory deficits b/l.  Globally weak PSYCHIATRIC: The patient is alert and oriented x 1. SKIN: No obvious rash, lesion, or ulcer.     DATA REVIEW:   CBC Recent Labs  Lab 08/14/17 0612  WBC 10.7  HGB 12.2  HCT 36.2  PLT 342    Chemistries  Recent Labs  Lab 08/14/17 0612  NA 137  K 3.9  CL 95*  CO2 34*  GLUCOSE 110*  BUN 12  CREATININE 0.47  CALCIUM 8.9    Cardiac Enzymes Recent Labs  Lab 08/11/17 0051  TROPONINI 0.05*    Microbiology Results  Results for orders placed or performed during the hospital encounter of 08/10/17  MRSA PCR Screening     Status: None   Collection Time: 08/10/17  4:37 PM  Result Value Ref Range Status   MRSA by PCR NEGATIVE NEGATIVE Final    Comment:        The GeneXpert MRSA Assay (FDA approved for NASAL specimens only), is one component of a comprehensive MRSA colonization surveillance program. It is not intended to diagnose MRSA infection nor to guide or monitor treatment for MRSA infections. Performed at [facility], [address] [place], [place] [address]     RADIOLOGY:  Dg Chest Port 1 View  Result Date: 08/14/2017 CLINICAL DATA:  Hypoxia. EXAM: PORTABLE CHEST 1 VIEW COMPARISON:  August 12, 2017  FINDINGS: Mild cardiomegaly persists. The hila and mediastinum are unchanged. No pneumothorax. Patchy opacities in the right mid lung, right base, and left lateral mid lung are stable. No other interval changes. IMPRESSION: No interval change in patchy opacities, right greater  than left. Electronically Signed   By: Dorise Bullion III M.D   On: 08/14/2017 10:57      Management plans discussed with the patient, family and they are in agreement.  CODE STATUS:     Code Status Orders  (From admission, onward)        Start     Ordered   08/10/17 1602  Do not attempt resuscitation (DNR)  Continuous    Question Answer Comment  In the event of cardiac or respiratory ARREST Do not call a "code blue"   In the event of cardiac or respiratory ARREST Do not perform Intubation, CPR, defibrillation or ACLS   In the event of cardiac or respiratory ARREST Use medication by any route, position, wound care, and other measures to relive pain and suffering. May use oxygen, suction and manual treatment of airway obstruction as needed for comfort.      08/10/17 1601    Advance Directive Documentation     Most Recent Value  Type of Advance Directive  Healthcare Power of Attorney  Pre-existing out of facility DNR order (yellow form or pink MOST form)  -  "MOST" Form in Place?  -      TOTAL TIME TAKING CARE OF THIS PATIENT: 40 minutes.    [name] M.D on 08/14/2017 at 1:50 PM  Between 7am to 6pm - Pager - [phone]  After 6pm go to [email or web address] - Proofreader  [facility]  Office  [phone]  CC: Primary care physician; Virginia Crews, MD

## 2017-08-14 NOTE — NC FL2 (Signed)
[place] LEVEL OF CARE SCREENING TOOL     IDENTIFICATION  Patient Name: Patricia Foley Birthdate: 1931/10/20 Sex: female Admission Date (Current Location): 08/10/2017  [place] and [place] Number:  Engineering geologist and Address:  [facility], [address], [place], [place] [address]      Provider Number: [id number]  Attending Physician Name and Address:  [name], MD  Relative Name and Phone Number:  Merton Border Niece [phone]  [phone]     Current Level of Care: Hospital Recommended Level of Care: Memory Care, [place] Prior Approval Number:    Date Approved/Denied:   PASRR Number:    Discharge Plan: Other (Comment)([facility])    Current Diagnoses: Patient Active Problem List   Diagnosis Date Noted  . SOB (shortness of breath) 08/10/2017  . Nonhealing nonsurgical wound 05/11/2017  . Callus of foot 05/11/2017  . Urinary frequency 02/10/2017  . COPD (chronic obstructive pulmonary disease) ([place])   . Dementia   . GERD (gastroesophageal reflux disease)   . Hypertension   . Tremors of nervous system   . Hypothyroidism   . Atrial fibrillation ([place])   . HOH (hard of hearing)   . Anemia   . Arthritis   . Chronic venous insufficiency 10/04/2015    Orientation RESPIRATION BLADDER Height & Weight     Self  O2(2L) Continent Weight: 136 lb (61.7 kg) Height:  5\' 2"  (157.5 cm)  BEHAVIORAL SYMPTOMS/MOOD NEUROLOGICAL BOWEL NUTRITION STATUS      Continent Diet(Regular)  AMBULATORY STATUS COMMUNICATION OF NEEDS Skin   Supervision Verbally Normal                       Personal Care Assistance Level of Assistance  Bathing, Feeding, Dressing Bathing Assistance: Limited assistance Feeding assistance: Limited assistance Dressing Assistance: Limited assistance     Functional Limitations Info  Sight, Hearing, Speech Sight Info: Adequate Hearing Info: Adequate Speech  Info: Adequate    SPECIAL CARE FACTORS FREQUENCY  PT (By licensed PT), OT (By licensed OT)     PT Frequency: Home Health minimum 2x a week OT Frequency: Home Health minimum 2x a week            Contractures Contractures Info: Not present    Additional Factors Info  Code Status, Allergies, Psychotropic Code Status Info: DNR Allergies Info: SULFA ANTIBIOTICS, COUMADIN WARFARIN  Psychotropic Info: QUEtiapine (SEROQUEL) tablet 25 mg          Current Medications (08/14/2017):  This is the current hospital active medication list Current Facility-Administered Medications  Medication Dose Route Frequency Provider Last Rate Last Dose  . 0.9 %  sodium chloride infusion  250 mL Intravenous PRN Dustin Flock, MD      . acetaminophen (TYLENOL) tablet 650 mg  650 mg Oral Q6H PRN Dustin Flock, MD   650 mg at 08/11/17 2147   Or  . acetaminophen (TYLENOL) suppository 650 mg  650 mg Rectal Q6H PRN Dustin Flock, MD      . albuterol (PROVENTIL) (2.5 MG/3ML) 0.083% nebulizer solution 3 mL  3 mL Inhalation Q6H PRN Dustin Flock, MD      . aspirin EC tablet 81 mg  81 mg Oral Daily Dustin Flock, MD   81 mg at 08/14/17 1134  . bisacodyl (DULCOLAX) suppository 10 mg  10 mg Rectal Once Sainani, Belia Heman, MD      . calcium-vitamin D (OSCAL WITH D) 500-200 MG-UNIT per  tablet   Oral Daily Dustin Flock, MD   1 tablet at 08/14/17 1135  . digoxin (LANOXIN) tablet 0.125 mg  0.125 mg Oral Daily Dustin Flock, MD   0.125 mg at 08/14/17 1133  . diltiazem (CARDIZEM CD) 24 hr capsule 300 mg  300 mg Oral Daily Dustin Flock, MD   300 mg at 08/14/17 1134  . docusate (COLACE) 50 MG/5ML liquid 50 mg  50 mg Oral QHS Vaughan Basta, MD   50 mg at 08/12/17 2239  . donepezil (ARICEPT) tablet 20 mg  20 mg Oral Daily Dustin Flock, MD   20 mg at 08/14/17 1133  . enoxaparin (LOVENOX) injection 40 mg  40 mg Subcutaneous Q24H Dustin Flock, MD   40 mg at 08/13/17 2137  . ferrous sulfate tablet 325  mg  325 mg Oral Q breakfast Dustin Flock, MD   325 mg at 08/14/17 1132  . fluticasone (FLONASE) 50 MCG/ACT nasal spray 1 spray  1 spray Each Nare Daily Dustin Flock, MD   1 spray at 08/14/17 1130  . furosemide (LASIX) injection 20 mg  20 mg Intravenous Daily Vaughan Basta, MD   20 mg at 08/14/17 1131  . guaiFENesin (MUCINEX) 12 hr tablet 600 mg  600 mg Oral BID Dustin Flock, MD   600 mg at 08/14/17 1133  . guaiFENesin-dextromethorphan (ROBITUSSIN DM) 100-10 MG/5ML syrup 5 mL  5 mL Oral Q4H PRN Dustin Flock, MD   5 mL at 08/11/17 0452  . haloperidol lactate (HALDOL) injection 2 mg  2 mg Intravenous Q6H PRN Dustin Flock, MD   2 mg at 08/10/17 1932  . ipratropium-albuterol (DUONEB) 0.5-2.5 (3) MG/3ML nebulizer solution 3 mL  3 mL Nebulization Q6H PRN Dustin Flock, MD   3 mL at 08/11/17 0451  . levofloxacin (LEVAQUIN) IVPB 750 mg  750 mg Intravenous Q48H Dustin Flock, MD   Stopped at 08/12/17 1954  . lisinopril (PRINIVIL,ZESTRIL) tablet 5 mg  5 mg Oral Daily Dustin Flock, MD   5 mg at 08/14/17 1131  . MEDLINE mouth rinse  15 mL Mouth Rinse BID Dustin Flock, MD   15 mL at 08/13/17 1008  . ondansetron (ZOFRAN) tablet 4 mg  4 mg Oral Q6H PRN Dustin Flock, MD       Or  . ondansetron (ZOFRAN) injection 4 mg  4 mg Intravenous Q6H PRN Dustin Flock, MD      . potassium chloride SA (K-DUR,KLOR-CON) CR tablet 20 mEq  20 mEq Oral Daily Vaughan Basta, MD   20 mEq at 08/14/17 1135  . QUEtiapine (SEROQUEL) tablet 25 mg  25 mg Oral Daily Dustin Flock, MD   25 mg at 08/14/17 1134  . sodium chloride flush (NS) 0.9 % injection 3 mL  3 mL Intravenous Q12H Dustin Flock, MD   3 mL at 08/14/17 1136  . sodium chloride flush (NS) 0.9 % injection 3 mL  3 mL Intravenous PRN Dustin Flock, MD         Discharge Medications: Medication List           STOP taking these medications          mupirocin ointment 2 % Commonly known as:  BACTROBAN   naproxen 500 MG  tablet Commonly known as:  NAPROSYN                   TAKE these medications          acetaminophen 325 MG tablet Commonly known as:  TYLENOL Take 2 tablets (  650 mg total) by mouth every 6 (six) hours as needed for mild pain (or Fever >/= 101).   albuterol 108 (90 Base) MCG/ACT inhaler Commonly known as:  PROVENTIL HFA;VENTOLIN HFA Inhale 2 puffs into the lungs every 6 (six) hours as needed for wheezing or shortness of breath.   aspirin 81 MG EC tablet TAKE 1 TABLET BY MOUTH EVERY DAY   CALCIUM 600+D 600-200 MG-UNIT Tabs Generic drug:  Calcium Carbonate-Vitamin D Take 1 tablet by mouth daily.   COLACE PO Take 50 mg by mouth at bedtime.   digoxin 0.125 MG tablet Commonly known as:  LANOXIN Take 1 tablet (0.125 mg total) daily by mouth.   diltiazem 300 MG 24 hr capsule Commonly known as:  TIAZAC Take 1 capsule (300 mg total) by mouth daily.   donepezil 10 MG tablet Commonly known as:  ARICEPT Take 20 mg by mouth daily.   ferrous sulfate 325 (65 FE) MG tablet Take 325 mg by mouth daily with breakfast.   fluticasone 50 MCG/ACT nasal spray Commonly known as:  FLONASE Place 1 spray into the nose daily.   furosemide 40 MG tablet Commonly known as:  LASIX Take 40 mg by mouth daily.   ipratropium-albuterol 0.5-2.5 (3) MG/3ML Soln Commonly known as:  DUONEB Take 3 mLs by nebulization every 6 (six) hours as needed.   levofloxacin 250 MG tablet Commonly known as:  LEVAQUIN Take 1 tablet (250 mg total) by mouth every other day for 5 doses.   lisinopril 5 MG tablet Commonly known as:  PRINIVIL,ZESTRIL Take 1 tablet (5 mg total) by mouth daily.   MUCINEX FAST-MAX 10-650-400 MG/20ML Liqd Generic drug:  Phenylephrine-APAP-guaiFENesin Take 20 mLs by mouth every 12 (twelve) hours as needed (COUGH,CONGESTION).   potassium chloride SA 20 MEQ tablet Commonly known as:  K-DUR,KLOR-CON Take 1 tablet (20 mEq total) by mouth daily.   PROCTOSOL HC 2.5 %  rectal cream Generic drug:  hydrocortisone Apply 1 application topically 3 (three) times daily.   QUEtiapine 25 MG tablet Commonly known as:  SEROQUEL Take 1 tablet by mouth daily.   triamcinolone cream 0.1 % Commonly known as:  KENALOG Apply 1 application topically 2 (two) times daily. Due to facial picking      Relevant Imaging Results:  Relevant Lab Results:   Additional Information SS# [id number]  Zettie Pho, LCSW

## 2017-08-14 NOTE — Progress Notes (Signed)
SATURATION QUALIFICATIONS: (This note is used to comply with regulatory documentation for home oxygen)  Patient Saturations on Room Air at Rest = 85%  Patient Saturations on Room Air while Ambulating = %  Patient Saturations on 2 Liters of oxygen while Ambulating = 92%  Please briefly explain why patient needs home oxygen:

## 2017-08-14 NOTE — Care Management Note (Signed)
Case Management Note  Patient Details  Name: Patricia Foley MRN: [id number] Date of Birth: 1931-10-17  Subjective/Objective:  Patient to be discharged today per MD order. Orders in place for home health. [facility] is resident of [name]. Patient will receive these services through [facility]. Spoke with Judson Roch to place referral and she was already aware of situation and will iniaite PT in the home. Patient also to leave on oxygen. RNCM assisted primary RN in entering qualifying saturations. Also, DME nebulizer order. Both pieces of equipment delivered by [place]. Son to provide transport home.       Ines Bloomer RN BSN RNCM [phone]            Action/Plan:   Expected Discharge Date:  08/14/17               Expected Discharge Plan:     In-House Referral:     Discharge planning Services  CM Consult  Post Acute Care Choice:  Durable Medical Equipment Choice offered to:  NA, Patient  DME Arranged:  Nebulizer machine, Oxygen DME Agency:  [place]:  PT [facility] Agency:  [place]  Status of Service:  Completed, signed off  If discussed at [place] of Stay Meetings, dates discussed:    Additional Comments:  Latanya Maudlin, RN 08/14/2017, 3:36 PM

## 2017-08-14 NOTE — Progress Notes (Signed)
On 3L [place] her oxygen saturation vary from 89 - 99%.

## 2017-08-14 NOTE — Clinical Social Work Note (Signed)
The patient will discharge to [facility] today via transport by her grandson. The RNCM is ordering the patient's new o2, and the facility is aware of the o2 needs. Tiffany at [name] has confirmed that the patient can return today. The CSW will deliver the packet as soon as possible. The CSW will sign off at that time. Please consult should needs change.  Santiago Bumpers, MSW, [facility] Press[facility] [phone]

## 2017-08-14 NOTE — Progress Notes (Signed)
Discharged to [facility].  Patricia Foley will drive her over.  He will take them the discharge packet.  She is being discharged on 2L of oxygen by [place].  She has both a fixed tank and a travel tank with her.

## 2017-08-14 NOTE — Progress Notes (Signed)
Using a flexible finger probe her oxy sats are 90 - 92% on 2L [place].  O2 sats monitored for 5 mnutes.

## 2017-08-15 ENCOUNTER — Telehealth: Payer: Self-pay

## 2017-08-15 NOTE — Telephone Encounter (Signed)
Transition Care Management Follow-up Telephone Call  How have you been since you were released from the hospital? Per Belinda pt slept most of the day yesterday and ate a good breakfat today. She is unsure of her current status as she has not gone to see her today but plans to.  Do you understand why you were in the hospital? yes  Do you have a copy of your discharge instructions Yes Do you understand the discharge instrcutions? yes  Where were you discharged to? Assisted Living  Do you have support at home? N/A   Items Reviewed:  Medications obtained Yes  Medications reviewed: Yes  Dietary changes reviewed: yes, low sodium heart healthy  Home Health? N/A  DME ordered at discharge obtained? Yes  Medical supplies: Oxygen   Functional Questionnaire:   Activities of Daily Living (ADLs):   She states they are independent in the following: ambulation, feeding, continence, grooming, toileting and dressing States they require assistance with the following: bathing and hygiene and medication management  Any transportation issues/concerns?: no  Any patient concerns? no  Confirmed importance and date/time of follow-up visits scheduled with PCP: yes  Confirm appointment scheduled with specialist? NA  Confirmed with patient if condition begins to worsen call PCP or If it's emergency go to the ER.

## 2017-08-16 DIAGNOSIS — I110 Hypertensive heart disease with heart failure: Secondary | ICD-10-CM | POA: Diagnosis not present

## 2017-08-16 DIAGNOSIS — J189 Pneumonia, unspecified organism: Secondary | ICD-10-CM | POA: Diagnosis not present

## 2017-08-16 DIAGNOSIS — J440 Chronic obstructive pulmonary disease with acute lower respiratory infection: Secondary | ICD-10-CM | POA: Diagnosis not present

## 2017-08-16 DIAGNOSIS — M1990 Unspecified osteoarthritis, unspecified site: Secondary | ICD-10-CM | POA: Diagnosis not present

## 2017-08-16 DIAGNOSIS — D649 Anemia, unspecified: Secondary | ICD-10-CM | POA: Diagnosis not present

## 2017-08-16 DIAGNOSIS — G309 Alzheimer's disease, unspecified: Secondary | ICD-10-CM | POA: Diagnosis not present

## 2017-08-16 DIAGNOSIS — I482 Chronic atrial fibrillation: Secondary | ICD-10-CM | POA: Diagnosis not present

## 2017-08-16 DIAGNOSIS — F0280 Dementia in other diseases classified elsewhere without behavioral disturbance: Secondary | ICD-10-CM | POA: Diagnosis not present

## 2017-08-16 DIAGNOSIS — I5033 Acute on chronic diastolic (congestive) heart failure: Secondary | ICD-10-CM | POA: Diagnosis not present

## 2017-08-17 ENCOUNTER — Telehealth: Payer: Self-pay | Admitting: Family Medicine

## 2017-08-17 NOTE — Telephone Encounter (Signed)
Needs verbal order for home health nursing and 2 times a week for 2 weeks.    Call back is [phone]  [name]

## 2017-08-19 DIAGNOSIS — F0280 Dementia in other diseases classified elsewhere without behavioral disturbance: Secondary | ICD-10-CM | POA: Diagnosis not present

## 2017-08-19 DIAGNOSIS — J189 Pneumonia, unspecified organism: Secondary | ICD-10-CM | POA: Diagnosis not present

## 2017-08-19 DIAGNOSIS — D649 Anemia, unspecified: Secondary | ICD-10-CM | POA: Diagnosis not present

## 2017-08-19 DIAGNOSIS — I110 Hypertensive heart disease with heart failure: Secondary | ICD-10-CM | POA: Diagnosis not present

## 2017-08-19 DIAGNOSIS — G309 Alzheimer's disease, unspecified: Secondary | ICD-10-CM | POA: Diagnosis not present

## 2017-08-19 DIAGNOSIS — I482 Chronic atrial fibrillation: Secondary | ICD-10-CM | POA: Diagnosis not present

## 2017-08-19 DIAGNOSIS — I5033 Acute on chronic diastolic (congestive) heart failure: Secondary | ICD-10-CM | POA: Diagnosis not present

## 2017-08-19 DIAGNOSIS — J440 Chronic obstructive pulmonary disease with acute lower respiratory infection: Secondary | ICD-10-CM | POA: Diagnosis not present

## 2017-08-19 DIAGNOSIS — M1990 Unspecified osteoarthritis, unspecified site: Secondary | ICD-10-CM | POA: Diagnosis not present

## 2017-08-19 NOTE — Telephone Encounter (Signed)
Ok for verbal 

## 2017-08-19 NOTE — Telephone Encounter (Signed)
Please advise 

## 2017-08-19 NOTE — Telephone Encounter (Signed)
That's fine

## 2017-08-19 NOTE — Telephone Encounter (Signed)
Verbal order given to Angie.

## 2017-08-19 NOTE — Telephone Encounter (Signed)
[name] is calling to see if someone else can sign off on this order since Dr. Brita Romp is not here.

## 2017-08-22 ENCOUNTER — Telehealth: Payer: Self-pay | Admitting: Family Medicine

## 2017-08-22 NOTE — Telephone Encounter (Signed)
Ok to give verbal orders?

## 2017-08-22 NOTE — Telephone Encounter (Signed)
OK for verbal orders?

## 2017-08-22 NOTE — Telephone Encounter (Signed)
Patricia Foley with [facility] is requesting in home physical therapy orders as follows:  Once a week for 1 week (1W1) Twice a week for 6 weeks (2W6) Once a week for 1 week (1W1)  Please advise. Thanks TNP

## 2017-08-23 ENCOUNTER — Encounter: Payer: Self-pay | Admitting: Family Medicine

## 2017-08-23 ENCOUNTER — Ambulatory Visit (INDEPENDENT_AMBULATORY_CARE_PROVIDER_SITE_OTHER): Payer: Medicare HMO | Admitting: Family Medicine

## 2017-08-23 VITALS — BP 136/62 | HR 65 | Temp 97.6°F | Resp 20

## 2017-08-23 DIAGNOSIS — J9601 Acute respiratory failure with hypoxia: Secondary | ICD-10-CM

## 2017-08-23 DIAGNOSIS — I5030 Unspecified diastolic (congestive) heart failure: Secondary | ICD-10-CM | POA: Insufficient documentation

## 2017-08-23 DIAGNOSIS — J189 Pneumonia, unspecified organism: Secondary | ICD-10-CM | POA: Diagnosis not present

## 2017-08-23 DIAGNOSIS — J440 Chronic obstructive pulmonary disease with acute lower respiratory infection: Secondary | ICD-10-CM | POA: Diagnosis not present

## 2017-08-23 DIAGNOSIS — M1990 Unspecified osteoarthritis, unspecified site: Secondary | ICD-10-CM | POA: Diagnosis not present

## 2017-08-23 DIAGNOSIS — I482 Chronic atrial fibrillation: Secondary | ICD-10-CM | POA: Diagnosis not present

## 2017-08-23 DIAGNOSIS — I5033 Acute on chronic diastolic (congestive) heart failure: Secondary | ICD-10-CM

## 2017-08-23 DIAGNOSIS — I110 Hypertensive heart disease with heart failure: Secondary | ICD-10-CM | POA: Diagnosis not present

## 2017-08-23 DIAGNOSIS — G309 Alzheimer's disease, unspecified: Secondary | ICD-10-CM | POA: Diagnosis not present

## 2017-08-23 DIAGNOSIS — J449 Chronic obstructive pulmonary disease, unspecified: Secondary | ICD-10-CM

## 2017-08-23 DIAGNOSIS — D649 Anemia, unspecified: Secondary | ICD-10-CM | POA: Diagnosis not present

## 2017-08-23 DIAGNOSIS — F0280 Dementia in other diseases classified elsewhere without behavioral disturbance: Secondary | ICD-10-CM | POA: Diagnosis not present

## 2017-08-23 NOTE — Progress Notes (Signed)
Patient: Patricia Foley Female    DOB: 12-05-1931   84 y.o.   MRN: [id number] Visit Date: 08/24/2017  Today's Provider: Lavon Paganini, MD   I, Martha Clan, CMA, am acting as scribe for Lavon Paganini, MD.  Chief Complaint  Patient presents with  . Hospitalization Follow-up   Subjective:    HPI     Follow up Hospitalization  Patient was admitted to [facility] on 08/14/2017 and discharged on 08/14/2017. She was treated for respiratory failure with hypoxia due to underlying CHF and pneumonia. Treatment for this included IV Levaquin, IV Lasix, DuoNebs, and discharged on 2L [place] continuous, previously on no O2. Also D/C with po Levaquin 250 mg QOD x 5 doses. Home PO Lasix resumed on d/c.  Granddaughter states she completed this course. Telephone follow up was done on 08/15/2017 - reviewed She reports good compliance with treatment. She reports this condition is Improved.  ------------------------------------------------------------------------------------   Allergies  Allergen Reactions  . Sulfa Antibiotics Hives  . Coumadin [Warfarin] Rash     Current Outpatient Medications:  .  acetaminophen (TYLENOL) 325 MG tablet, Take 2 tablets (650 mg total) by mouth every 6 (six) hours as needed for mild pain (or Fever >/= 101)., Disp: 90 tablet, Rfl: 1 .  albuterol (PROVENTIL HFA;VENTOLIN HFA) 108 (90 Base) MCG/ACT inhaler, Inhale 2 puffs into the lungs every 6 (six) hours as needed for wheezing or shortness of breath., Disp: 1 Inhaler, Rfl: 3 .  aspirin 81 MG EC tablet, TAKE 1 TABLET BY MOUTH EVERY DAY, Disp: 90 tablet, Rfl: 1 .  Calcium Carbonate-Vitamin D (CALCIUM 600+D) 600-200 MG-UNIT TABS, Take 1 tablet by mouth daily., Disp: , Rfl:  .  digoxin (LANOXIN) 0.125 MG tablet, Take 1 tablet (0.125 mg total) daily by mouth., Disp: 90 tablet, Rfl: 3 .  diltiazem (TIAZAC) 300 MG 24 hr capsule, Take 1 capsule (300 mg total) by mouth daily., Disp: 90 capsule, Rfl: 3 .  Docusate  Sodium (COLACE PO), Take 50 mg by mouth at bedtime. , Disp: , Rfl:  .  donepezil (ARICEPT) 10 MG tablet, Take 20 mg by mouth daily. , Disp: , Rfl:  .  ferrous sulfate 325 (65 FE) MG tablet, Take 325 mg by mouth daily with breakfast., Disp: , Rfl:  .  furosemide (LASIX) 40 MG tablet, Take 40 mg by mouth daily. , Disp: , Rfl:  .  hydrocortisone (PROCTOSOL HC) 2.5 % rectal cream, Apply 1 application topically 3 (three) times daily., Disp: , Rfl:  .  ipratropium-albuterol (DUONEB) 0.5-2.5 (3) MG/3ML SOLN, Take 3 mLs by nebulization every 6 (six) hours as needed., Disp: 360 mL, Rfl: 0 .  lisinopril (PRINIVIL,ZESTRIL) 5 MG tablet, Take 1 tablet (5 mg total) by mouth daily., Disp: 90 tablet, Rfl: 3 .  naproxen sodium (ALEVE) 220 MG tablet, Take 220 mg by mouth., Disp: , Rfl:  .  potassium chloride SA (K-DUR,KLOR-CON) 20 MEQ tablet, Take 1 tablet (20 mEq total) by mouth daily., Disp: 30 tablet, Rfl: 3 .  QUEtiapine (SEROQUEL) 25 MG tablet, Take 1 tablet by mouth daily., Disp: , Rfl:  .  triamcinolone cream (KENALOG) 0.1 %, Apply 1 application topically 2 (two) times daily. Due to facial picking, Disp: , Rfl:  .  fluticasone (FLONASE) 50 MCG/ACT nasal spray, Place 1 spray into the nose daily., Disp: , Rfl:   Review of Systems  Constitutional: Negative for activity change, appetite change, chills, diaphoresis, fatigue, fever and unexpected weight change.  HENT: Negative.  Respiratory: Negative.  Negative for cough and shortness of breath.   Cardiovascular: Negative.   Gastrointestinal: Negative.   Genitourinary: Negative.   Musculoskeletal: Negative.   Skin: Negative.   Neurological: Negative.     Social History   Tobacco Use  . Smoking status: Former Smoker    Packs/day: 1.00    Years: 50.00    Pack years: 50.00    Types: Cigarettes    Last attempt to quit: 04/14/2016    Years since quitting: 1.3  . Smokeless tobacco: Never Used  Substance Use Topics  . Alcohol use: No   Objective:    BP 136/62 (BP Location: Left Arm, Patient Position: Sitting, Cuff Size: Normal)   Pulse 65   Temp 97.6 F (36.4 C) (Oral)   Resp 20   SpO2 91%  Vitals:   08/23/17 1039 08/23/17 1112 08/23/17 1113  BP: 136/62    Pulse: 65    Resp: 20    Temp: 97.6 F (36.4 C)    TempSrc: Oral    SpO2: 97% 95% 91%   SpO2 on RA at rest 95% SpO2 on RA while ambulating 91%   Physical Exam  Constitutional: She is oriented to person, place, and time. She appears well-developed and well-nourished. No distress.  HENT:  Head: Normocephalic and atraumatic.  Eyes: Conjunctivae are normal. No scleral icterus.  Neck: Neck supple. No thyromegaly present.  Cardiovascular: Normal rate, regular rhythm, normal heart sounds and intact distal pulses.  No murmur heard. Pulmonary/Chest: Effort normal and breath sounds normal. No respiratory distress. She has no wheezes. She has no rales.  Abdominal: Soft. She exhibits no distension. There is no tenderness.  Musculoskeletal: She exhibits no edema.  Compression stockings in place  Lymphadenopathy:    She has no cervical adenopathy.  Neurological: She is alert and oriented to person, place, and time.  Skin: Skin is warm and dry. Capillary refill takes less than 2 seconds. No rash noted.  Psychiatric: She has a normal mood and affect.  Vitals reviewed.      Assessment & Plan:   Problem List Items Addressed This Visit      Cardiovascular and Mediastinum   (HFpEF) heart failure with preserved ejection fraction (HCC)    Stable HFpEF with G1DD Currently euvolemic Continue lasix and lisinopril Respiratory failure 2/2 this is also improving        Respiratory   COPD (chronic obstructive pulmonary disease) ([place])    Much improved since hospitalization Respiratory failure also improving No controller meds currently Continue albuterol as needed Continue to avoid cigarettes-patient currently believes they have stopped making cigarettes      Acute respiratory  failure with hypoxia (HCC) - Primary    Improving significantly SPO2 on room air while ambulating is appropriate to discontinue oxygen therapy Will advise her SNF to monitor O2 sats with each vitals check Return precautions discussed      Community acquired pneumonia    Resolved status post antibiotic therapy with Levaquin Associated respiratory failure with hypoxia also improving We will discontinue oxygen therapy as above Return precautions discussed          Return in about 1 month (around 09/20/2017) for chronic disease f/u.   The entirety of the information documented in the History of Present Illness, Review of Systems and Physical Exam were personally obtained by me. Portions of this information were initially documented by Raquel Sarna Ratchford, CMA and reviewed by me for thoroughness and accuracy.    Virginia Crews, MD, MPH  [place] 08/24/2017 9:08 AM

## 2017-08-23 NOTE — Assessment & Plan Note (Addendum)
Stable HFpEF with G1DD Currently euvolemic Continue lasix and lisinopril Respiratory failure 2/2 this is also improving

## 2017-08-23 NOTE — Telephone Encounter (Signed)
Orders given.  

## 2017-08-23 NOTE — Patient Instructions (Signed)

## 2017-08-24 ENCOUNTER — Telehealth: Payer: Self-pay | Admitting: Family Medicine

## 2017-08-24 DIAGNOSIS — J189 Pneumonia, unspecified organism: Secondary | ICD-10-CM | POA: Diagnosis not present

## 2017-08-24 DIAGNOSIS — I5033 Acute on chronic diastolic (congestive) heart failure: Secondary | ICD-10-CM | POA: Diagnosis not present

## 2017-08-24 DIAGNOSIS — M1990 Unspecified osteoarthritis, unspecified site: Secondary | ICD-10-CM | POA: Diagnosis not present

## 2017-08-24 DIAGNOSIS — J440 Chronic obstructive pulmonary disease with acute lower respiratory infection: Secondary | ICD-10-CM | POA: Diagnosis not present

## 2017-08-24 DIAGNOSIS — D649 Anemia, unspecified: Secondary | ICD-10-CM | POA: Diagnosis not present

## 2017-08-24 DIAGNOSIS — I110 Hypertensive heart disease with heart failure: Secondary | ICD-10-CM | POA: Diagnosis not present

## 2017-08-24 DIAGNOSIS — I482 Chronic atrial fibrillation: Secondary | ICD-10-CM | POA: Diagnosis not present

## 2017-08-24 DIAGNOSIS — F0280 Dementia in other diseases classified elsewhere without behavioral disturbance: Secondary | ICD-10-CM | POA: Diagnosis not present

## 2017-08-24 DIAGNOSIS — G309 Alzheimer's disease, unspecified: Secondary | ICD-10-CM | POA: Diagnosis not present

## 2017-08-24 NOTE — Telephone Encounter (Signed)
Ok to DC

## 2017-08-24 NOTE — Assessment & Plan Note (Signed)
Improving significantly SPO2 on room air while ambulating is appropriate to discontinue oxygen therapy Will advise her SNF to monitor O2 sats with each vitals check Return precautions discussed

## 2017-08-24 NOTE — Telephone Encounter (Signed)
Pt's care giver state [facility] will need orders to discontinue the order for oxygen befroe they pick it up.  Pick up will be at [facility].

## 2017-08-24 NOTE — Telephone Encounter (Signed)
Yes. D/c O2  Annett Boxwell, Dionne Bucy, MD, MPH [facility] 08/24/2017 4:41 PM

## 2017-08-24 NOTE — Assessment & Plan Note (Signed)
Resolved status post antibiotic therapy with Levaquin Associated respiratory failure with hypoxia also improving We will discontinue oxygen therapy as above Return precautions discussed

## 2017-08-24 NOTE — Assessment & Plan Note (Signed)
Much improved since hospitalization Respiratory failure also improving No controller meds currently Continue albuterol as needed Continue to avoid cigarettes-patient currently believes they have stopped making cigarettes

## 2017-08-25 ENCOUNTER — Other Ambulatory Visit: Payer: Self-pay | Admitting: Family Medicine

## 2017-08-25 DIAGNOSIS — I482 Chronic atrial fibrillation: Secondary | ICD-10-CM | POA: Diagnosis not present

## 2017-08-25 DIAGNOSIS — D649 Anemia, unspecified: Secondary | ICD-10-CM | POA: Diagnosis not present

## 2017-08-25 DIAGNOSIS — I110 Hypertensive heart disease with heart failure: Secondary | ICD-10-CM | POA: Diagnosis not present

## 2017-08-25 DIAGNOSIS — J440 Chronic obstructive pulmonary disease with acute lower respiratory infection: Secondary | ICD-10-CM | POA: Diagnosis not present

## 2017-08-25 DIAGNOSIS — J189 Pneumonia, unspecified organism: Secondary | ICD-10-CM | POA: Diagnosis not present

## 2017-08-25 DIAGNOSIS — G309 Alzheimer's disease, unspecified: Secondary | ICD-10-CM | POA: Diagnosis not present

## 2017-08-25 DIAGNOSIS — M1990 Unspecified osteoarthritis, unspecified site: Secondary | ICD-10-CM | POA: Diagnosis not present

## 2017-08-25 DIAGNOSIS — I5033 Acute on chronic diastolic (congestive) heart failure: Secondary | ICD-10-CM | POA: Diagnosis not present

## 2017-08-25 DIAGNOSIS — F0280 Dementia in other diseases classified elsewhere without behavioral disturbance: Secondary | ICD-10-CM | POA: Diagnosis not present

## 2017-08-25 MED ORDER — TRIAMCINOLONE ACETONIDE 0.1 % EX CREA: 1.0000 "application " | TOPICAL_CREAM | Freq: Two times a day (BID) | CUTANEOUS | 5 refills | Status: DC

## 2017-08-25 NOTE — Telephone Encounter (Signed)
Pt's niece called asking for a refill on the face cream that she uses for places on her face that she picks at.  Generic Kenalog 0.1 %   [facility] [address]  [employer]

## 2017-08-25 NOTE — Telephone Encounter (Signed)
error 

## 2017-08-25 NOTE — Telephone Encounter (Signed)
OK to refill

## 2017-08-25 NOTE — Telephone Encounter (Signed)
OK to send with 5 refills  Trek Kimball, Dionne Bucy, MD, MPH [facility] 08/25/2017 12:00 PM

## 2017-08-25 NOTE — Telephone Encounter (Signed)
This has been escribed.

## 2017-08-26 DIAGNOSIS — M1990 Unspecified osteoarthritis, unspecified site: Secondary | ICD-10-CM | POA: Diagnosis not present

## 2017-08-26 DIAGNOSIS — I482 Chronic atrial fibrillation: Secondary | ICD-10-CM | POA: Diagnosis not present

## 2017-08-26 DIAGNOSIS — F0280 Dementia in other diseases classified elsewhere without behavioral disturbance: Secondary | ICD-10-CM | POA: Diagnosis not present

## 2017-08-26 DIAGNOSIS — I110 Hypertensive heart disease with heart failure: Secondary | ICD-10-CM | POA: Diagnosis not present

## 2017-08-26 DIAGNOSIS — I5033 Acute on chronic diastolic (congestive) heart failure: Secondary | ICD-10-CM | POA: Diagnosis not present

## 2017-08-26 DIAGNOSIS — D649 Anemia, unspecified: Secondary | ICD-10-CM | POA: Diagnosis not present

## 2017-08-26 DIAGNOSIS — G309 Alzheimer's disease, unspecified: Secondary | ICD-10-CM | POA: Diagnosis not present

## 2017-08-26 DIAGNOSIS — J440 Chronic obstructive pulmonary disease with acute lower respiratory infection: Secondary | ICD-10-CM | POA: Diagnosis not present

## 2017-08-26 DIAGNOSIS — J189 Pneumonia, unspecified organism: Secondary | ICD-10-CM | POA: Diagnosis not present

## 2017-08-26 NOTE — Telephone Encounter (Signed)
Faxed to home health facility.

## 2017-08-30 ENCOUNTER — Telehealth: Payer: Self-pay | Admitting: Family Medicine

## 2017-08-30 DIAGNOSIS — D649 Anemia, unspecified: Secondary | ICD-10-CM | POA: Diagnosis not present

## 2017-08-30 DIAGNOSIS — M1990 Unspecified osteoarthritis, unspecified site: Secondary | ICD-10-CM | POA: Diagnosis not present

## 2017-08-30 DIAGNOSIS — I110 Hypertensive heart disease with heart failure: Secondary | ICD-10-CM | POA: Diagnosis not present

## 2017-08-30 DIAGNOSIS — I5033 Acute on chronic diastolic (congestive) heart failure: Secondary | ICD-10-CM | POA: Diagnosis not present

## 2017-08-30 DIAGNOSIS — G309 Alzheimer's disease, unspecified: Secondary | ICD-10-CM | POA: Diagnosis not present

## 2017-08-30 DIAGNOSIS — J189 Pneumonia, unspecified organism: Secondary | ICD-10-CM | POA: Diagnosis not present

## 2017-08-30 DIAGNOSIS — J440 Chronic obstructive pulmonary disease with acute lower respiratory infection: Secondary | ICD-10-CM | POA: Diagnosis not present

## 2017-08-30 DIAGNOSIS — F0280 Dementia in other diseases classified elsewhere without behavioral disturbance: Secondary | ICD-10-CM | POA: Diagnosis not present

## 2017-08-30 DIAGNOSIS — I482 Chronic atrial fibrillation: Secondary | ICD-10-CM | POA: Diagnosis not present

## 2017-08-30 NOTE — Telephone Encounter (Signed)
Patricia Foley with [name] called saying pt's blood pressure is running low  Left arm /-122/54  and rt arm is 120/52 /// sitting  [name]

## 2017-08-30 NOTE — Telephone Encounter (Signed)
Please review

## 2017-08-31 ENCOUNTER — Telehealth (INDEPENDENT_AMBULATORY_CARE_PROVIDER_SITE_OTHER): Payer: Medicare HMO

## 2017-08-31 DIAGNOSIS — D649 Anemia, unspecified: Secondary | ICD-10-CM | POA: Diagnosis not present

## 2017-08-31 DIAGNOSIS — G309 Alzheimer's disease, unspecified: Secondary | ICD-10-CM | POA: Diagnosis not present

## 2017-08-31 DIAGNOSIS — J189 Pneumonia, unspecified organism: Secondary | ICD-10-CM | POA: Diagnosis not present

## 2017-08-31 DIAGNOSIS — I5033 Acute on chronic diastolic (congestive) heart failure: Secondary | ICD-10-CM | POA: Diagnosis not present

## 2017-08-31 DIAGNOSIS — R4689 Other symptoms and signs involving appearance and behavior: Secondary | ICD-10-CM | POA: Diagnosis not present

## 2017-08-31 DIAGNOSIS — I110 Hypertensive heart disease with heart failure: Secondary | ICD-10-CM | POA: Diagnosis not present

## 2017-08-31 DIAGNOSIS — I482 Chronic atrial fibrillation: Secondary | ICD-10-CM | POA: Diagnosis not present

## 2017-08-31 DIAGNOSIS — F0280 Dementia in other diseases classified elsewhere without behavioral disturbance: Secondary | ICD-10-CM | POA: Diagnosis not present

## 2017-08-31 DIAGNOSIS — M1990 Unspecified osteoarthritis, unspecified site: Secondary | ICD-10-CM | POA: Diagnosis not present

## 2017-08-31 DIAGNOSIS — J440 Chronic obstructive pulmonary disease with acute lower respiratory infection: Secondary | ICD-10-CM | POA: Diagnosis not present

## 2017-08-31 LAB — POCT URINALYSIS DIPSTICK
Appearance: NORMAL
Bilirubin, UA: NEGATIVE
Glucose, UA: NEGATIVE
Ketones, UA: NEGATIVE
LEUKOCYTES UA: NEGATIVE
NITRITE UA: NEGATIVE
Odor: NORMAL
PROTEIN UA: NEGATIVE
RBC UA: NEGATIVE
SPEC GRAV UA: 1.01 (ref 1.010–1.025)
Urobilinogen, UA: 0.2 E.U./dL
pH, UA: 6.5 (ref 5.0–8.0)

## 2017-08-31 NOTE — Telephone Encounter (Signed)
UA completely normal.  Please let [facility] know  Bacigalupo, Dionne Bucy, MD, MPH [facility] 08/31/2017 1:18 PM

## 2017-08-31 NOTE — Telephone Encounter (Signed)
OK to hold lisinopril for now.  If BP elevates again, let us know and we can restart it.  Virginia Crews, MD, MPH [facility] 08/31/2017 8:24 AM

## 2017-08-31 NOTE — Telephone Encounter (Signed)
Urine was dropped off for UA due to bevior changes. Please review results.

## 2017-08-31 NOTE — Telephone Encounter (Signed)
Done  Brita Romp Dionne Bucy, MD, MPH [facility] 08/31/2017 9:30 AM

## 2017-08-31 NOTE — Telephone Encounter (Signed)
Sonia Baller verbally advised. Need to fax order to [facility]. Please write on rx pad. Thanks.

## 2017-08-31 NOTE — Telephone Encounter (Signed)
Going to fax results.

## 2017-09-01 ENCOUNTER — Ambulatory Visit: Payer: Self-pay | Admitting: Family Medicine

## 2017-09-01 DIAGNOSIS — J440 Chronic obstructive pulmonary disease with acute lower respiratory infection: Secondary | ICD-10-CM | POA: Diagnosis not present

## 2017-09-01 DIAGNOSIS — M1990 Unspecified osteoarthritis, unspecified site: Secondary | ICD-10-CM | POA: Diagnosis not present

## 2017-09-01 DIAGNOSIS — I110 Hypertensive heart disease with heart failure: Secondary | ICD-10-CM | POA: Diagnosis not present

## 2017-09-01 DIAGNOSIS — J189 Pneumonia, unspecified organism: Secondary | ICD-10-CM | POA: Diagnosis not present

## 2017-09-01 DIAGNOSIS — D649 Anemia, unspecified: Secondary | ICD-10-CM | POA: Diagnosis not present

## 2017-09-01 DIAGNOSIS — G309 Alzheimer's disease, unspecified: Secondary | ICD-10-CM | POA: Diagnosis not present

## 2017-09-01 DIAGNOSIS — I482 Chronic atrial fibrillation: Secondary | ICD-10-CM | POA: Diagnosis not present

## 2017-09-01 DIAGNOSIS — I5033 Acute on chronic diastolic (congestive) heart failure: Secondary | ICD-10-CM | POA: Diagnosis not present

## 2017-09-01 DIAGNOSIS — F0280 Dementia in other diseases classified elsewhere without behavioral disturbance: Secondary | ICD-10-CM | POA: Diagnosis not present

## 2017-09-05 DIAGNOSIS — I110 Hypertensive heart disease with heart failure: Secondary | ICD-10-CM | POA: Diagnosis not present

## 2017-09-05 DIAGNOSIS — I5033 Acute on chronic diastolic (congestive) heart failure: Secondary | ICD-10-CM | POA: Diagnosis not present

## 2017-09-05 DIAGNOSIS — M1990 Unspecified osteoarthritis, unspecified site: Secondary | ICD-10-CM | POA: Diagnosis not present

## 2017-09-05 DIAGNOSIS — J440 Chronic obstructive pulmonary disease with acute lower respiratory infection: Secondary | ICD-10-CM | POA: Diagnosis not present

## 2017-09-05 DIAGNOSIS — J189 Pneumonia, unspecified organism: Secondary | ICD-10-CM | POA: Diagnosis not present

## 2017-09-05 DIAGNOSIS — I482 Chronic atrial fibrillation: Secondary | ICD-10-CM | POA: Diagnosis not present

## 2017-09-05 DIAGNOSIS — D649 Anemia, unspecified: Secondary | ICD-10-CM | POA: Diagnosis not present

## 2017-09-05 DIAGNOSIS — G309 Alzheimer's disease, unspecified: Secondary | ICD-10-CM | POA: Diagnosis not present

## 2017-09-05 DIAGNOSIS — F0280 Dementia in other diseases classified elsewhere without behavioral disturbance: Secondary | ICD-10-CM | POA: Diagnosis not present

## 2017-09-08 DIAGNOSIS — I482 Chronic atrial fibrillation: Secondary | ICD-10-CM | POA: Diagnosis not present

## 2017-09-08 DIAGNOSIS — J440 Chronic obstructive pulmonary disease with acute lower respiratory infection: Secondary | ICD-10-CM | POA: Diagnosis not present

## 2017-09-08 DIAGNOSIS — I110 Hypertensive heart disease with heart failure: Secondary | ICD-10-CM | POA: Diagnosis not present

## 2017-09-08 DIAGNOSIS — I5033 Acute on chronic diastolic (congestive) heart failure: Secondary | ICD-10-CM | POA: Diagnosis not present

## 2017-09-08 DIAGNOSIS — M1990 Unspecified osteoarthritis, unspecified site: Secondary | ICD-10-CM | POA: Diagnosis not present

## 2017-09-08 DIAGNOSIS — F0280 Dementia in other diseases classified elsewhere without behavioral disturbance: Secondary | ICD-10-CM | POA: Diagnosis not present

## 2017-09-08 DIAGNOSIS — D649 Anemia, unspecified: Secondary | ICD-10-CM | POA: Diagnosis not present

## 2017-09-08 DIAGNOSIS — J189 Pneumonia, unspecified organism: Secondary | ICD-10-CM | POA: Diagnosis not present

## 2017-09-08 DIAGNOSIS — G309 Alzheimer's disease, unspecified: Secondary | ICD-10-CM | POA: Diagnosis not present

## 2017-09-12 DIAGNOSIS — I482 Chronic atrial fibrillation: Secondary | ICD-10-CM | POA: Diagnosis not present

## 2017-09-12 DIAGNOSIS — G309 Alzheimer's disease, unspecified: Secondary | ICD-10-CM | POA: Diagnosis not present

## 2017-09-12 DIAGNOSIS — I5033 Acute on chronic diastolic (congestive) heart failure: Secondary | ICD-10-CM | POA: Diagnosis not present

## 2017-09-12 DIAGNOSIS — F0280 Dementia in other diseases classified elsewhere without behavioral disturbance: Secondary | ICD-10-CM | POA: Diagnosis not present

## 2017-09-12 DIAGNOSIS — I110 Hypertensive heart disease with heart failure: Secondary | ICD-10-CM | POA: Diagnosis not present

## 2017-09-12 DIAGNOSIS — M1990 Unspecified osteoarthritis, unspecified site: Secondary | ICD-10-CM | POA: Diagnosis not present

## 2017-09-12 DIAGNOSIS — J440 Chronic obstructive pulmonary disease with acute lower respiratory infection: Secondary | ICD-10-CM | POA: Diagnosis not present

## 2017-09-12 DIAGNOSIS — J189 Pneumonia, unspecified organism: Secondary | ICD-10-CM | POA: Diagnosis not present

## 2017-09-12 DIAGNOSIS — D649 Anemia, unspecified: Secondary | ICD-10-CM | POA: Diagnosis not present

## 2017-09-14 DIAGNOSIS — M1990 Unspecified osteoarthritis, unspecified site: Secondary | ICD-10-CM | POA: Diagnosis not present

## 2017-09-14 DIAGNOSIS — I482 Chronic atrial fibrillation: Secondary | ICD-10-CM | POA: Diagnosis not present

## 2017-09-14 DIAGNOSIS — G309 Alzheimer's disease, unspecified: Secondary | ICD-10-CM | POA: Diagnosis not present

## 2017-09-14 DIAGNOSIS — I110 Hypertensive heart disease with heart failure: Secondary | ICD-10-CM | POA: Diagnosis not present

## 2017-09-14 DIAGNOSIS — J440 Chronic obstructive pulmonary disease with acute lower respiratory infection: Secondary | ICD-10-CM | POA: Diagnosis not present

## 2017-09-14 DIAGNOSIS — F0280 Dementia in other diseases classified elsewhere without behavioral disturbance: Secondary | ICD-10-CM | POA: Diagnosis not present

## 2017-09-14 DIAGNOSIS — J189 Pneumonia, unspecified organism: Secondary | ICD-10-CM | POA: Diagnosis not present

## 2017-09-14 DIAGNOSIS — D649 Anemia, unspecified: Secondary | ICD-10-CM | POA: Diagnosis not present

## 2017-09-14 DIAGNOSIS — I5033 Acute on chronic diastolic (congestive) heart failure: Secondary | ICD-10-CM | POA: Diagnosis not present

## 2017-09-19 DIAGNOSIS — G309 Alzheimer's disease, unspecified: Secondary | ICD-10-CM | POA: Diagnosis not present

## 2017-09-19 DIAGNOSIS — I5033 Acute on chronic diastolic (congestive) heart failure: Secondary | ICD-10-CM | POA: Diagnosis not present

## 2017-09-19 DIAGNOSIS — D649 Anemia, unspecified: Secondary | ICD-10-CM | POA: Diagnosis not present

## 2017-09-19 DIAGNOSIS — I110 Hypertensive heart disease with heart failure: Secondary | ICD-10-CM | POA: Diagnosis not present

## 2017-09-19 DIAGNOSIS — J189 Pneumonia, unspecified organism: Secondary | ICD-10-CM | POA: Diagnosis not present

## 2017-09-19 DIAGNOSIS — J440 Chronic obstructive pulmonary disease with acute lower respiratory infection: Secondary | ICD-10-CM | POA: Diagnosis not present

## 2017-09-19 DIAGNOSIS — I482 Chronic atrial fibrillation: Secondary | ICD-10-CM | POA: Diagnosis not present

## 2017-09-19 DIAGNOSIS — F0280 Dementia in other diseases classified elsewhere without behavioral disturbance: Secondary | ICD-10-CM | POA: Diagnosis not present

## 2017-09-19 DIAGNOSIS — M1990 Unspecified osteoarthritis, unspecified site: Secondary | ICD-10-CM | POA: Diagnosis not present

## 2017-09-21 ENCOUNTER — Encounter: Payer: Self-pay | Admitting: Family Medicine

## 2017-09-21 ENCOUNTER — Ambulatory Visit (INDEPENDENT_AMBULATORY_CARE_PROVIDER_SITE_OTHER): Payer: Medicare HMO | Admitting: Family Medicine

## 2017-09-21 VITALS — BP 138/84 | HR 78 | Temp 98.1°F | Resp 16 | Wt 138.0 lb

## 2017-09-21 DIAGNOSIS — I10 Essential (primary) hypertension: Secondary | ICD-10-CM

## 2017-09-21 NOTE — Assessment & Plan Note (Signed)
Well controlled Off of lisinopril - will d/c Can d/c daily BP checks at [facility]

## 2017-09-21 NOTE — Patient Instructions (Signed)
Stop lisinopril  Blood pressure is good

## 2017-09-21 NOTE — Progress Notes (Signed)
Patient: Patricia Foley Female    DOB: 1931-09-30   84 y.o.   MRN: [id number] Visit Date: 09/21/2017  Today's Provider: Lavon Paganini, MD   Chief Complaint  Patient presents with  . Hypertension   Subjective:    HPI      Hypertension, follow-up:  BP Readings from Last 3 Encounters:  09/21/17 138/84  08/23/17 136/62  08/14/17 (!) 157/69    She was last seen for hypertension 6 weeks ago.  BP at that visit was 156/68. Management since that visit includes starting Lisinopril 5 mg po qd. Lisinopril is not on pt's med list from [facility]. Outside blood pressures are 138-158/68-95. She is experiencing lower extremity edema.  Patient denies chest pain, chest pressure/discomfort, dyspnea, irregular heart beat and palpitations.     Wt Readings from Last 3 Encounters:  09/21/17 138 lb (62.6 kg)  08/14/17 136 lb (61.7 kg)  08/02/17 145 lb (65.8 kg)   ------------------------------------------------------------------------   Allergies  Allergen Reactions  . Sulfa Antibiotics Hives  . Coumadin [Warfarin] Rash     Current Outpatient Medications:  .  acetaminophen (TYLENOL) 325 MG tablet, Take 2 tablets (650 mg total) by mouth every 6 (six) hours as needed for mild pain (or Fever >/= 101)., Disp: 90 tablet, Rfl: 1 .  albuterol (PROVENTIL HFA;VENTOLIN HFA) 108 (90 Base) MCG/ACT inhaler, Inhale 2 puffs into the lungs every 6 (six) hours as needed for wheezing or shortness of breath., Disp: 1 Inhaler, Rfl: 3 .  aspirin 81 MG EC tablet, TAKE 1 TABLET BY MOUTH EVERY DAY, Disp: 90 tablet, Rfl: 1 .  Calcium Carbonate-Vitamin D (CALCIUM 600+D) 600-200 MG-UNIT TABS, Take 1 tablet by mouth daily., Disp: , Rfl:  .  digoxin (LANOXIN) 0.125 MG tablet, Take 1 tablet (0.125 mg total) daily by mouth., Disp: 90 tablet, Rfl: 3 .  diltiazem (TIAZAC) 300 MG 24 hr capsule, Take 1 capsule (300 mg total) by mouth daily., Disp: 90 capsule, Rfl: 3 .  Docusate Sodium (COLACE PO), Take 50  mg by mouth at bedtime. , Disp: , Rfl:  .  donepezil (ARICEPT) 10 MG tablet, Take 20 mg by mouth daily. , Disp: , Rfl:  .  fluticasone (FLONASE) 50 MCG/ACT nasal spray, Place 1 spray into the nose daily., Disp: , Rfl:  .  furosemide (LASIX) 40 MG tablet, Take 40 mg by mouth daily. , Disp: , Rfl:  .  hydrocortisone (PROCTOSOL HC) 2.5 % rectal cream, Apply 1 application topically 3 (three) times daily., Disp: , Rfl:  .  ipratropium-albuterol (DUONEB) 0.5-2.5 (3) MG/3ML SOLN, Take 3 mLs by nebulization every 6 (six) hours as needed., Disp: 360 mL, Rfl: 0 .  naproxen sodium (ALEVE) 220 MG tablet, Take 220 mg by mouth., Disp: , Rfl:  .  potassium chloride SA (K-DUR,KLOR-CON) 20 MEQ tablet, Take 1 tablet (20 mEq total) by mouth daily., Disp: 30 tablet, Rfl: 3 .  QUEtiapine (SEROQUEL) 25 MG tablet, Take 1 tablet by mouth daily., Disp: , Rfl:  .  triamcinolone cream (KENALOG) 0.1 %, Apply 1 application topically 2 (two) times daily. Due to facial picking, Disp: 30 g, Rfl: 5 .  ferrous sulfate 325 (65 FE) MG tablet, Take 325 mg by mouth daily with breakfast., Disp: , Rfl:   Review of Systems  Constitutional: Negative for activity change, appetite change, chills, diaphoresis, fatigue, fever and unexpected weight change.  Respiratory: Negative for shortness of breath.   Cardiovascular: Positive for leg swelling. Negative for chest  pain and palpitations.  Hematological: Bruises/bleeds easily.    Social History   Tobacco Use  . Smoking status: Former Smoker    Packs/day: 1.00    Years: 50.00    Pack years: 50.00    Types: Cigarettes    Last attempt to quit: 04/14/2016    Years since quitting: 1.4  . Smokeless tobacco: Never Used  Substance Use Topics  . Alcohol use: No   Objective:   BP 138/84 (BP Location: Left Arm, Patient Position: Sitting, Cuff Size: Normal)   Pulse 78   Temp 98.1 F (36.7 C) (Oral)   Resp 16   Wt 138 lb (62.6 kg)   SpO2 96%   BMI 25.24 kg/m  Vitals:   09/21/17 0811   BP: 138/84  Pulse: 78  Resp: 16  Temp: 98.1 F (36.7 C)  TempSrc: Oral  SpO2: 96%  Weight: 138 lb (62.6 kg)     Physical Exam  Constitutional: She is oriented to person, place, and time. She appears well-developed and well-nourished. No distress.  HENT:  Head: Normocephalic and atraumatic.  Eyes: Conjunctivae are normal. No scleral icterus.  Neck: Neck supple. No thyromegaly present.  Cardiovascular: Normal rate, regular rhythm, normal heart sounds and intact distal pulses.  Pulmonary/Chest: Effort normal and breath sounds normal. No respiratory distress. She has no wheezes. She has no rales.  Musculoskeletal: She exhibits no edema.  Lymphadenopathy:    She has no cervical adenopathy.  Neurological: She is alert and oriented to person, place, and time.  Skin: Skin is warm and dry. Capillary refill takes less than 2 seconds. No rash noted.  Psychiatric: She has a normal mood and affect. Her behavior is normal.  Vitals reviewed.       Assessment & Plan:   Problem List Items Addressed This Visit      Cardiovascular and Mediastinum   Hypertension - Primary    Well controlled Off of lisinopril - will d/c Can d/c daily BP checks at SNF          Return in about 3 months (around 12/22/2017) for chronic disease f/u .Mar[place] Kitchen   The entirety of the information documented in the History of Present Illness, Review of Systems and Physical Exam were personally obtained by me. Portions of this information were initially documented by Raquel Sarna Ratchford, CMA and reviewed by me for thoroughness and accuracy.    Virginia Crews, MD, MPH [facility] 09/21/2017 8:35 AM

## 2017-09-22 DIAGNOSIS — M1990 Unspecified osteoarthritis, unspecified site: Secondary | ICD-10-CM | POA: Diagnosis not present

## 2017-09-22 DIAGNOSIS — I5033 Acute on chronic diastolic (congestive) heart failure: Secondary | ICD-10-CM | POA: Diagnosis not present

## 2017-09-22 DIAGNOSIS — G309 Alzheimer's disease, unspecified: Secondary | ICD-10-CM | POA: Diagnosis not present

## 2017-09-22 DIAGNOSIS — J189 Pneumonia, unspecified organism: Secondary | ICD-10-CM | POA: Diagnosis not present

## 2017-09-22 DIAGNOSIS — I110 Hypertensive heart disease with heart failure: Secondary | ICD-10-CM | POA: Diagnosis not present

## 2017-09-22 DIAGNOSIS — J440 Chronic obstructive pulmonary disease with acute lower respiratory infection: Secondary | ICD-10-CM | POA: Diagnosis not present

## 2017-09-22 DIAGNOSIS — I482 Chronic atrial fibrillation: Secondary | ICD-10-CM | POA: Diagnosis not present

## 2017-09-22 DIAGNOSIS — F0280 Dementia in other diseases classified elsewhere without behavioral disturbance: Secondary | ICD-10-CM | POA: Diagnosis not present

## 2017-09-22 DIAGNOSIS — D649 Anemia, unspecified: Secondary | ICD-10-CM | POA: Diagnosis not present

## 2017-09-23 ENCOUNTER — Other Ambulatory Visit: Payer: Self-pay | Admitting: Family Medicine

## 2017-09-26 DIAGNOSIS — M1990 Unspecified osteoarthritis, unspecified site: Secondary | ICD-10-CM | POA: Diagnosis not present

## 2017-09-26 DIAGNOSIS — G309 Alzheimer's disease, unspecified: Secondary | ICD-10-CM | POA: Diagnosis not present

## 2017-09-26 DIAGNOSIS — J189 Pneumonia, unspecified organism: Secondary | ICD-10-CM | POA: Diagnosis not present

## 2017-09-26 DIAGNOSIS — I482 Chronic atrial fibrillation: Secondary | ICD-10-CM | POA: Diagnosis not present

## 2017-09-26 DIAGNOSIS — D649 Anemia, unspecified: Secondary | ICD-10-CM | POA: Diagnosis not present

## 2017-09-26 DIAGNOSIS — J440 Chronic obstructive pulmonary disease with acute lower respiratory infection: Secondary | ICD-10-CM | POA: Diagnosis not present

## 2017-09-26 DIAGNOSIS — I110 Hypertensive heart disease with heart failure: Secondary | ICD-10-CM | POA: Diagnosis not present

## 2017-09-26 DIAGNOSIS — F0280 Dementia in other diseases classified elsewhere without behavioral disturbance: Secondary | ICD-10-CM | POA: Diagnosis not present

## 2017-09-26 DIAGNOSIS — I5033 Acute on chronic diastolic (congestive) heart failure: Secondary | ICD-10-CM | POA: Diagnosis not present

## 2017-09-28 DIAGNOSIS — G309 Alzheimer's disease, unspecified: Secondary | ICD-10-CM | POA: Diagnosis not present

## 2017-09-28 DIAGNOSIS — D649 Anemia, unspecified: Secondary | ICD-10-CM | POA: Diagnosis not present

## 2017-09-28 DIAGNOSIS — I482 Chronic atrial fibrillation: Secondary | ICD-10-CM | POA: Diagnosis not present

## 2017-09-28 DIAGNOSIS — I110 Hypertensive heart disease with heart failure: Secondary | ICD-10-CM | POA: Diagnosis not present

## 2017-09-28 DIAGNOSIS — F0280 Dementia in other diseases classified elsewhere without behavioral disturbance: Secondary | ICD-10-CM | POA: Diagnosis not present

## 2017-09-28 DIAGNOSIS — M1990 Unspecified osteoarthritis, unspecified site: Secondary | ICD-10-CM | POA: Diagnosis not present

## 2017-09-28 DIAGNOSIS — J440 Chronic obstructive pulmonary disease with acute lower respiratory infection: Secondary | ICD-10-CM | POA: Diagnosis not present

## 2017-09-28 DIAGNOSIS — I5033 Acute on chronic diastolic (congestive) heart failure: Secondary | ICD-10-CM | POA: Diagnosis not present

## 2017-09-28 DIAGNOSIS — J189 Pneumonia, unspecified organism: Secondary | ICD-10-CM | POA: Diagnosis not present

## 2017-10-04 DIAGNOSIS — I482 Chronic atrial fibrillation: Secondary | ICD-10-CM | POA: Diagnosis not present

## 2017-10-04 DIAGNOSIS — I5033 Acute on chronic diastolic (congestive) heart failure: Secondary | ICD-10-CM | POA: Diagnosis not present

## 2017-10-04 DIAGNOSIS — F0280 Dementia in other diseases classified elsewhere without behavioral disturbance: Secondary | ICD-10-CM | POA: Diagnosis not present

## 2017-10-04 DIAGNOSIS — J440 Chronic obstructive pulmonary disease with acute lower respiratory infection: Secondary | ICD-10-CM | POA: Diagnosis not present

## 2017-10-04 DIAGNOSIS — M1990 Unspecified osteoarthritis, unspecified site: Secondary | ICD-10-CM | POA: Diagnosis not present

## 2017-10-04 DIAGNOSIS — I110 Hypertensive heart disease with heart failure: Secondary | ICD-10-CM | POA: Diagnosis not present

## 2017-10-04 DIAGNOSIS — D649 Anemia, unspecified: Secondary | ICD-10-CM | POA: Diagnosis not present

## 2017-10-04 DIAGNOSIS — G309 Alzheimer's disease, unspecified: Secondary | ICD-10-CM | POA: Diagnosis not present

## 2017-10-04 DIAGNOSIS — J189 Pneumonia, unspecified organism: Secondary | ICD-10-CM | POA: Diagnosis not present

## 2017-10-28 ENCOUNTER — Other Ambulatory Visit: Payer: Self-pay | Admitting: Family Medicine

## 2017-10-28 MED ORDER — POTASSIUM CHLORIDE CRYS ER 20 MEQ PO TBCR: 20.0000 meq | EXTENDED_RELEASE_TABLET | Freq: Every day | ORAL | 3 refills | Status: DC

## 2017-10-28 NOTE — Telephone Encounter (Signed)
[facility] pharmacy faxed a refill request for the following medication. Thanks CC  potassium chloride SA (K-DUR,KLOR-CON) 20 MEQ tablet

## 2017-10-31 ENCOUNTER — Other Ambulatory Visit: Payer: Self-pay | Admitting: Family Medicine

## 2017-10-31 MED ORDER — POTASSIUM CHLORIDE CRYS ER 20 MEQ PO TBCR: 20.0000 meq | EXTENDED_RELEASE_TABLET | Freq: Every day | ORAL | 3 refills | Status: DC

## 2017-10-31 NOTE — Telephone Encounter (Signed)
[facility] pharmacy faxed a refill request for the following medication. Thanks CC  potassium chloride SA (K-DUR,KLOR-CON) 20 MEQ tablet

## 2017-11-14 ENCOUNTER — Encounter: Payer: Self-pay | Admitting: Family Medicine

## 2017-11-14 ENCOUNTER — Ambulatory Visit (INDEPENDENT_AMBULATORY_CARE_PROVIDER_SITE_OTHER): Payer: Medicare HMO | Admitting: Family Medicine

## 2017-11-14 VITALS — BP 136/64 | HR 60 | Temp 97.9°F | Resp 16 | Wt 148.0 lb

## 2017-11-14 DIAGNOSIS — G301 Alzheimer's disease with late onset: Secondary | ICD-10-CM | POA: Diagnosis not present

## 2017-11-14 DIAGNOSIS — E039 Hypothyroidism, unspecified: Secondary | ICD-10-CM

## 2017-11-14 DIAGNOSIS — L03119 Cellulitis of unspecified part of limb: Secondary | ICD-10-CM

## 2017-11-14 DIAGNOSIS — I509 Heart failure, unspecified: Secondary | ICD-10-CM | POA: Diagnosis not present

## 2017-11-14 DIAGNOSIS — F02818 Dementia in other diseases classified elsewhere, unspecified severity, with other behavioral disturbance: Secondary | ICD-10-CM

## 2017-11-14 DIAGNOSIS — F0281 Dementia in other diseases classified elsewhere with behavioral disturbance: Secondary | ICD-10-CM | POA: Diagnosis not present

## 2017-11-14 MED ORDER — DOXYCYCLINE HYCLATE 100 MG PO TABS: 100.0000 mg | ORAL_TABLET | Freq: Two times a day (BID) | ORAL | 0 refills | Status: AC

## 2017-11-14 NOTE — Progress Notes (Signed)
Patient: Patricia Foley Female    DOB: Sep 09, 1931   84 y.o.   MRN: [id number] Visit Date: 11/14/2017  Today's Provider: Wilhemena Durie, MD   Chief Complaint  Patient presents with  . Leg Swelling   Subjective:    HPI Patient comes in today due to swelling in both legs. She comes accompanied with her caregiver, and she reports that the swelling has progressively gotten worse since last Tuesday (about 6 days). She reports that both legs are red and starting to weep. Patient's caregiver reports that she is supposed to wear support hose, but it does not help.  She has dementia and is brought in by her niece.  She has no behavioral issues but is a fairly agitated lady.    Allergies  Allergen Reactions  . Sulfa Antibiotics Hives  . Coumadin [Warfarin] Rash     Current Outpatient Medications:  .  acetaminophen (TYLENOL) 325 MG tablet, Take 2 tablets (650 mg total) by mouth every 6 (six) hours as needed for mild pain (or Fever >/= 101)., Disp: 90 tablet, Rfl: 1 .  albuterol (PROVENTIL HFA;VENTOLIN HFA) 108 (90 Base) MCG/ACT inhaler, Inhale 2 puffs into the lungs every 6 (six) hours as needed for wheezing or shortness of breath., Disp: 1 Inhaler, Rfl: 3 .  aspirin 81 MG EC tablet, TAKE 1 TABLET BY MOUTH EVERY DAY, Disp: 90 tablet, Rfl: 1 .  Calcium Carbonate-Vitamin D (CALCIUM 600+D) 600-200 MG-UNIT TABS, Take 1 tablet by mouth daily., Disp: , Rfl:  .  digoxin (LANOXIN) 0.125 MG tablet, TAKE 1 TABLET EVERY DAY, Disp: 90 tablet, Rfl: 3 .  diltiazem (TIAZAC) 300 MG 24 hr capsule, Take 1 capsule (300 mg total) by mouth daily., Disp: 90 capsule, Rfl: 3 .  Docusate Sodium (COLACE PO), Take 50 mg by mouth at bedtime. , Disp: , Rfl:  .  donepezil (ARICEPT) 10 MG tablet, Take 20 mg by mouth daily. , Disp: , Rfl:  .  ferrous sulfate 325 (65 FE) MG tablet, Take 325 mg by mouth daily with breakfast., Disp: , Rfl:  .  furosemide (LASIX) 40 MG tablet, Take 40 mg by mouth daily. , Disp: ,  Rfl:  .  hydrocortisone (PROCTOSOL HC) 2.5 % rectal cream, Apply 1 application topically 3 (three) times daily., Disp: , Rfl:  .  ipratropium-albuterol (DUONEB) 0.5-2.5 (3) MG/3ML SOLN, Take 3 mLs by nebulization every 6 (six) hours as needed., Disp: 360 mL, Rfl: 0 .  naproxen sodium (ALEVE) 220 MG tablet, Take 220 mg by mouth., Disp: , Rfl:  .  potassium chloride SA (K-DUR,KLOR-CON) 20 MEQ tablet, Take 1 tablet (20 mEq total) by mouth daily., Disp: 30 tablet, Rfl: 3 .  QUEtiapine (SEROQUEL) 25 MG tablet, Take 1 tablet by mouth daily., Disp: , Rfl:  .  triamcinolone cream (KENALOG) 0.1 %, Apply 1 application topically 2 (two) times daily. Due to facial picking, Disp: 30 g, Rfl: 5 .  fluticasone (FLONASE) 50 MCG/ACT nasal spray, Place 1 spray into the nose daily., Disp: , Rfl:   Review of Systems  Constitutional: Negative.   HENT: Negative.   Eyes: Negative.   Respiratory: Negative.   Cardiovascular: Negative.   Gastrointestinal: Positive for abdominal distention.       Her niece thinks her abdomen is slightly distended.  Endocrine: Negative.   Genitourinary: Negative.   Skin: Positive for rash.       He picks at her face.  Her lower legs about halfway  down are erythematous and her right leg is somewhat weeping.  Hematological: Negative.   Psychiatric/Behavioral: Positive for agitation and confusion.    Social History   Tobacco Use  . Smoking status: Former Smoker    Packs/day: 1.00    Years: 50.00    Pack years: 50.00    Types: Cigarettes    Last attempt to quit: 04/14/2016    Years since quitting: 1.5  . Smokeless tobacco: Never Used  Substance Use Topics  . Alcohol use: No   Objective:   BP 136/64 (BP Location: Left Arm, Patient Position: Sitting, Cuff Size: Normal)   Pulse 60   Temp 97.9 F (36.6 C)   Resp 16   Wt 148 lb (67.1 kg)   SpO2 96%   BMI 27.07 kg/m  Vitals:   11/14/17 1455  BP: 136/64  Pulse: 60  Resp: 16  Temp: 97.9 F (36.6 C)  SpO2: 96%  Weight:  148 lb (67.1 kg)     Physical Exam  Constitutional: She is oriented to person, place, and time. She appears well-developed and well-nourished.  HENT:  Head: Normocephalic and atraumatic.  Right Ear: External ear normal.  Left Ear: External ear normal.  Nose: Nose normal.  Eyes: Conjunctivae are normal. No scleral icterus.  Neck: No thyromegaly present.  Cardiovascular: Normal rate, regular rhythm and normal heart sounds.  Pulmonary/Chest: Effort normal and breath sounds normal.  Abdominal: Soft.  Musculoskeletal: She exhibits no edema.  Neurological: She is alert and oriented to person, place, and time.  Skin: Skin is warm and dry.  Psychiatric: She has a normal mood and affect. Her behavior is normal. Judgment and thought content normal.        Assessment & Plan:     1. Cellulitis of lower extremity, unspecified laterality  - CBC with Differential/Platelet - Comprehensive metabolic panel - doxycycline (VIBRA-TABS) 100 MG tablet; Take 1 tablet (100 mg total) by mouth 2 (two) times daily for 7 days.  Dispense: 14 tablet; Refill: 0  2. Congestive heart failure, unspecified HF chronicity, unspecified heart failure type ([place]) This could be CHF or it could be just venous insufficiency.  She has gained about 14 pounds over the course of this year.  Double Lasix.  Return to clinic next week - Pro b natriuretic peptide (BNP)  3. Hypothyroidism, unspecified type  - TSH  4. Late onset Alzheimer's disease with behavioral disturbance ([place])       I have done the exam and reviewed the above chart and it is accurate to the best of my knowledge. Development worker, community has been used in this note in any air is in the dictation or transcription are unintentional.  Wilhemena Durie, MD  [place]

## 2017-11-14 NOTE — Patient Instructions (Signed)
Discontinue Colace.  Start Lasix 40mg  2 tablets in the mornings.

## 2017-11-15 LAB — CBC WITH DIFFERENTIAL/PLATELET
BASOS: 1 %
Basophils Absolute: 0.1 10*3/uL (ref 0.0–0.2)
EOS (ABSOLUTE): 0.3 10*3/uL (ref 0.0–0.4)
Eos: 4 %
HEMATOCRIT: 33.5 % — AB (ref 34.0–46.6)
Hemoglobin: 11 g/dL — ABNORMAL LOW (ref 11.1–15.9)
Immature Grans (Abs): 0 10*3/uL (ref 0.0–0.1)
Immature Granulocytes: 0 %
LYMPHS ABS: 1.2 10*3/uL (ref 0.7–3.1)
Lymphs: 14 %
MCH: 29.4 pg (ref 26.6–33.0)
MCHC: 32.8 g/dL (ref 31.5–35.7)
MCV: 90 fL (ref 79–97)
MONOCYTES: 11 %
MONOS ABS: 0.9 10*3/uL (ref 0.1–0.9)
NEUTROS ABS: 5.8 10*3/uL (ref 1.4–7.0)
Neutrophils: 70 %
Platelets: 265 10*3/uL (ref 150–450)
RBC: 3.74 x10E6/uL — AB (ref 3.77–5.28)
RDW: 13.9 % (ref 12.3–15.4)
WBC: 8.1 10*3/uL (ref 3.4–10.8)

## 2017-11-15 LAB — COMPREHENSIVE METABOLIC PANEL
ALK PHOS: 112 IU/L (ref 39–117)
ALT: 11 IU/L (ref 0–32)
AST: 16 IU/L (ref 0–40)
Albumin/Globulin Ratio: 1.7 (ref 1.2–2.2)
Albumin: 4.4 g/dL (ref 3.5–4.7)
BUN/Creatinine Ratio: 17 (ref 12–28)
BUN: 12 mg/dL (ref 8–27)
Bilirubin Total: 0.3 mg/dL (ref 0.0–1.2)
CO2: 28 mmol/L (ref 20–29)
Calcium: 9.6 mg/dL (ref 8.7–10.3)
Chloride: 96 mmol/L (ref 96–106)
Creatinine, Ser: 0.69 mg/dL (ref 0.57–1.00)
GFR calc Af Amer: 92 mL/min/{1.73_m2} (ref >59)
GFR calc non Af Amer: 80 mL/min/{1.73_m2} (ref >59)
GLOBULIN, TOTAL: 2.6 g/dL (ref 1.5–4.5)
Glucose: 89 mg/dL (ref 65–99)
Potassium: 4.4 mmol/L (ref 3.5–5.2)
SODIUM: 140 mmol/L (ref 134–144)
Total Protein: 7 g/dL (ref 6.0–8.5)

## 2017-11-15 LAB — PRO B NATRIURETIC PEPTIDE: NT-PRO BNP: 368 pg/mL (ref 0–738)

## 2017-11-15 LAB — TSH: TSH: 0.434 u[IU]/mL — AB (ref 0.450–4.500)

## 2017-11-22 ENCOUNTER — Encounter: Payer: Self-pay | Admitting: Physician Assistant

## 2017-11-22 ENCOUNTER — Ambulatory Visit: Payer: Self-pay | Admitting: Family Medicine

## 2017-11-22 ENCOUNTER — Ambulatory Visit (INDEPENDENT_AMBULATORY_CARE_PROVIDER_SITE_OTHER): Payer: Medicare HMO | Admitting: Physician Assistant

## 2017-11-22 VITALS — BP 142/76 | HR 76 | Temp 97.7°F | Resp 16 | Ht 59.0 in | Wt 149.0 lb

## 2017-11-22 DIAGNOSIS — L0390 Cellulitis, unspecified: Secondary | ICD-10-CM | POA: Diagnosis not present

## 2017-11-22 MED ORDER — CLINDAMYCIN HCL 150 MG PO CAPS: 150.0000 mg | ORAL_CAPSULE | Freq: Four times a day (QID) | ORAL | 0 refills | Status: DC

## 2017-11-22 NOTE — Progress Notes (Signed)
Patient: Patricia Foley Female    DOB: 1931/04/21   84 y.o.   MRN: [id number] Visit Date: 11/24/2017  Today's Provider: Trinna Post, PA-C   Chief Complaint  Patient presents with  . Follow-up   Subjective:    HPI Follow up for swelling  The patient was last seen for this 8 days ago. Changes made at last visit include doxycycline for cellulitis, Lasix was increased to two tablets daily.   She reports excellent compliance with treatment. She feels that condition is Worse. She is not having side effects.   Patient has some cognitive impairment and is a resident at [facility]. She presents here today with her granddaughter and the granddaughter's husband who contribute most of the history. They report that she frequently scratches at her lower extremities. They report that despite being compliant with treatment, her legs look worse and more red today. They report that perhaps she is a little more confused then her baseline. They have not noticed any fevers, shaking, nausea, vomiting, falls. She was hospitalized for cellulitis last year and treated with clindamycin that seemed to help her.  ------------------------------------------------------------------------------------     Allergies  Allergen Reactions  . Sulfa Antibiotics Hives  . Coumadin [Warfarin] Rash     Current Outpatient Medications:  .  acetaminophen (TYLENOL) 325 MG tablet, Take 2 tablets (650 mg total) by mouth every 6 (six) hours as needed for mild pain (or Fever >/= 101)., Disp: 90 tablet, Rfl: 1 .  albuterol (PROVENTIL HFA;VENTOLIN HFA) 108 (90 Base) MCG/ACT inhaler, Inhale 2 puffs into the lungs every 6 (six) hours as needed for wheezing or shortness of breath., Disp: 1 Inhaler, Rfl: 3 .  aspirin 81 MG EC tablet, TAKE 1 TABLET BY MOUTH EVERY DAY, Disp: 90 tablet, Rfl: 1 .  Calcium Carbonate-Vitamin D (CALCIUM 600+D) 600-200 MG-UNIT TABS, Take 1 tablet by mouth daily., Disp: , Rfl:  .  digoxin  (LANOXIN) 0.125 MG tablet, TAKE 1 TABLET EVERY DAY, Disp: 90 tablet, Rfl: 3 .  diltiazem (TIAZAC) 300 MG 24 hr capsule, Take 1 capsule (300 mg total) by mouth daily., Disp: 90 capsule, Rfl: 3 .  Docusate Sodium (COLACE PO), Take 50 mg by mouth at bedtime. , Disp: , Rfl:  .  donepezil (ARICEPT) 10 MG tablet, Take 20 mg by mouth daily. , Disp: , Rfl:  .  ferrous sulfate 325 (65 FE) MG tablet, Take 325 mg by mouth daily with breakfast., Disp: , Rfl:  .  furosemide (LASIX) 40 MG tablet, Take 40 mg by mouth daily. , Disp: , Rfl:  .  hydrocortisone (PROCTOSOL HC) 2.5 % rectal cream, Apply 1 application topically 3 (three) times daily., Disp: , Rfl:  .  ipratropium-albuterol (DUONEB) 0.5-2.5 (3) MG/3ML SOLN, Take 3 mLs by nebulization every 6 (six) hours as needed., Disp: 360 mL, Rfl: 0 .  naproxen sodium (ALEVE) 220 MG tablet, Take 220 mg by mouth., Disp: , Rfl:  .  potassium chloride SA (K-DUR,KLOR-CON) 20 MEQ tablet, Take 1 tablet (20 mEq total) by mouth daily., Disp: 30 tablet, Rfl: 3 .  QUEtiapine (SEROQUEL) 25 MG tablet, Take 1 tablet by mouth daily., Disp: , Rfl:  .  triamcinolone cream (KENALOG) 0.1 %, Apply 1 application topically 2 (two) times daily. Due to facial picking, Disp: 30 g, Rfl: 5 .  clindamycin (CLEOCIN) 150 MG capsule, Take 1 capsule (150 mg total) by mouth 4 (four) times daily for 7 days., Disp: 28 capsule, Rfl: 0 .  fluticasone (FLONASE) 50 MCG/ACT nasal spray, Place 1 spray into the nose daily., Disp: , Rfl:   Review of Systems  Constitutional: Negative.   Cardiovascular: Positive for leg swelling (painful).    Social History   Tobacco Use  . Smoking status: Former Smoker    Packs/day: 1.00    Years: 50.00    Pack years: 50.00    Types: Cigarettes    Last attempt to quit: 04/14/2016    Years since quitting: 1.6  . Smokeless tobacco: Never Used  Substance Use Topics  . Alcohol use: No   Objective:   BP (!) 142/76 (BP Location: Left Arm, Patient Position: Sitting,  Cuff Size: Normal)   Pulse 76   Temp 97.7 F (36.5 C) (Oral)   Resp 16   Ht 4\' 11"  (1.499 m)   Wt 149 lb (67.6 kg)   SpO2 97%   BMI 30.09 kg/m  Vitals:   11/22/17 1609  BP: (!) 142/76  Pulse: 76  Resp: 16  Temp: 97.7 F (36.5 C)  TempSrc: Oral  SpO2: 97%  Weight: 149 lb (67.6 kg)  Height: 4\' 11"  (1.499 m)     Physical Exam  Constitutional: She appears well-developed and well-nourished.  Cardiovascular: Normal rate and regular rhythm.  Pulmonary/Chest: Effort normal and breath sounds normal.  Neurological: She is alert.  Responds to direct questions in a mostly nonsensical way  Skin: Skin is warm. There is erythema.     There is redness of her bilateral lower extremities extending to her mid shin. There is some warmth in this area. There is some fluid weepage and sloughing of skin. Patient is actively picking at area in the exam room.   Psychiatric: She has a normal mood and affect. Her behavior is normal.        Assessment & Plan:     1. Cellulitis, unspecified cellulitis site  Worsening cellulitis despite having been treated with course of doxycycline. She has an allergy to sulfa antibiotics. Will prescribe clindamycin, this worked for her during previous hospitalization for the same issue. Gave strict return precautions to her family. I have reviewed labwork on 11/14/2017 which show stable BNP for her age, CBC without leukocytosis, normal CMET, slightly low TSH.   - clindamycin (CLEOCIN) 150 MG capsule; Take 1 capsule (150 mg total) by mouth 4 (four) times daily for 7 days.  Dispense: 28 capsule; Refill: 0  Return in about 2 days (around 11/24/2017).  The entirety of the information documented in the History of Present Illness, Review of Systems and Physical Exam were personally obtained by me. Portions of this information were initially documented by Lynford Humphrey, CMA and reviewed by me for thoroughness and accuracy.         Trinna Post, PA-C    [facility]

## 2017-11-22 NOTE — Patient Instructions (Signed)

## 2017-11-24 ENCOUNTER — Ambulatory Visit: Payer: Self-pay | Admitting: Family Medicine

## 2017-11-25 ENCOUNTER — Ambulatory Visit: Payer: Self-pay | Admitting: Family Medicine

## 2017-11-25 NOTE — Progress Notes (Deleted)
Patient: Patricia Foley Female    DOB: 09/08/1931   84 y.o.   MRN: [id number] Visit Date: 11/25/2017  Today's Provider: Lavon Paganini, MD   No chief complaint on file.  Subjective:    HPI Cellulitis:  Patient presents for a 3 day follow up. Last OV was on 11/22/2017. Symptoms were worse, patient started Clindamycin 150 mg QID X 7 days. She reports good compliance with treatment plan. Symptoms are     Allergies  Allergen Reactions  . Sulfa Antibiotics Hives  . Coumadin [Warfarin] Rash     Current Outpatient Medications:  .  acetaminophen (TYLENOL) 325 MG tablet, Take 2 tablets (650 mg total) by mouth every 6 (six) hours as needed for mild pain (or Fever >/= 101)., Disp: 90 tablet, Rfl: 1 .  albuterol (PROVENTIL HFA;VENTOLIN HFA) 108 (90 Base) MCG/ACT inhaler, Inhale 2 puffs into the lungs every 6 (six) hours as needed for wheezing or shortness of breath., Disp: 1 Inhaler, Rfl: 3 .  aspirin 81 MG EC tablet, TAKE 1 TABLET BY MOUTH EVERY DAY, Disp: 90 tablet, Rfl: 1 .  Calcium Carbonate-Vitamin D (CALCIUM 600+D) 600-200 MG-UNIT TABS, Take 1 tablet by mouth daily., Disp: , Rfl:  .  clindamycin (CLEOCIN) 150 MG capsule, Take 1 capsule (150 mg total) by mouth 4 (four) times daily for 7 days., Disp: 28 capsule, Rfl: 0 .  digoxin (LANOXIN) 0.125 MG tablet, TAKE 1 TABLET EVERY DAY, Disp: 90 tablet, Rfl: 3 .  diltiazem (TIAZAC) 300 MG 24 hr capsule, Take 1 capsule (300 mg total) by mouth daily., Disp: 90 capsule, Rfl: 3 .  Docusate Sodium (COLACE PO), Take 50 mg by mouth at bedtime. , Disp: , Rfl:  .  donepezil (ARICEPT) 10 MG tablet, Take 20 mg by mouth daily. , Disp: , Rfl:  .  ferrous sulfate 325 (65 FE) MG tablet, Take 325 mg by mouth daily with breakfast., Disp: , Rfl:  .  fluticasone (FLONASE) 50 MCG/ACT nasal spray, Place 1 spray into the nose daily., Disp: , Rfl:  .  furosemide (LASIX) 40 MG tablet, Take 40 mg by mouth daily. , Disp: , Rfl:  .  hydrocortisone  (PROCTOSOL HC) 2.5 % rectal cream, Apply 1 application topically 3 (three) times daily., Disp: , Rfl:  .  ipratropium-albuterol (DUONEB) 0.5-2.5 (3) MG/3ML SOLN, Take 3 mLs by nebulization every 6 (six) hours as needed., Disp: 360 mL, Rfl: 0 .  naproxen sodium (ALEVE) 220 MG tablet, Take 220 mg by mouth., Disp: , Rfl:  .  potassium chloride SA (K-DUR,KLOR-CON) 20 MEQ tablet, Take 1 tablet (20 mEq total) by mouth daily., Disp: 30 tablet, Rfl: 3 .  QUEtiapine (SEROQUEL) 25 MG tablet, Take 1 tablet by mouth daily., Disp: , Rfl:  .  triamcinolone cream (KENALOG) 0.1 %, Apply 1 application topically 2 (two) times daily. Due to facial picking, Disp: 30 g, Rfl: 5    Review of Systems  Constitutional: Negative.   Respiratory: Negative.   Cardiovascular: Negative.   Musculoskeletal: Negative.   Skin:       Cellulitis     Social History   Tobacco Use  . Smoking status: Former Smoker    Packs/day: 1.00    Years: 50.00    Pack years: 50.00    Types: Cigarettes    Last attempt to quit: 04/14/2016    Years since quitting: 1.6  . Smokeless tobacco: Never Used  Substance Use Topics  . Alcohol use: No  Objective:   There were no vitals taken for this visit. There were no vitals filed for this visit.   Physical Exam      Assessment & Plan:           Lavon Paganini, MD  [facility]

## 2017-11-28 ENCOUNTER — Ambulatory Visit (INDEPENDENT_AMBULATORY_CARE_PROVIDER_SITE_OTHER): Payer: Medicare HMO | Admitting: Family Medicine

## 2017-11-28 ENCOUNTER — Encounter: Payer: Self-pay | Admitting: Family Medicine

## 2017-11-28 VITALS — BP 140/68 | HR 66 | Temp 98.5°F | Wt 148.0 lb

## 2017-11-28 DIAGNOSIS — I872 Venous insufficiency (chronic) (peripheral): Secondary | ICD-10-CM

## 2017-11-28 DIAGNOSIS — L03119 Cellulitis of unspecified part of limb: Secondary | ICD-10-CM

## 2017-11-28 MED ORDER — DOXYCYCLINE HYCLATE 100 MG PO TABS: 100.0000 mg | ORAL_TABLET | Freq: Two times a day (BID) | ORAL | 0 refills | Status: DC

## 2017-11-28 MED ORDER — AMOXICILLIN 875 MG PO TABS: 875.0000 mg | ORAL_TABLET | Freq: Two times a day (BID) | ORAL | 0 refills | Status: DC

## 2017-11-28 NOTE — Progress Notes (Signed)
Patient: Patricia Foley Female    DOB: September 15, 1931   84 y.o.   MRN: [id number] Visit Date: 11/28/2017  Today's Provider: Lavon Paganini, MD   Chief Complaint  Patient presents with  . Cellulitis   Subjective:  I, Tiburcio Pea, CMA, am acting as a scribe for Lavon Paganini, MD  HPI Cellulitis:  Patient presents for a 5 day follow up. Last OV was on 11/22/2017. Symptoms were worse, patient started Clindamycin 150 mg QID X 7 days. She has nearly finished course of Clindamycin.  Prior to this, she had been treated with Doxycycline.  Her grandson reports good compliance with treatment plan. Symptoms are not improved.  Continues to be red, swollen, and weepy and tender to touch.  No fevers or systemic symptoms.    Allergies  Allergen Reactions  . Sulfa Antibiotics Hives  . Coumadin [Warfarin] Rash     Current Outpatient Medications:  .  acetaminophen (TYLENOL) 325 MG tablet, Take 2 tablets (650 mg total) by mouth every 6 (six) hours as needed for mild pain (or Fever >/= 101)., Disp: 90 tablet, Rfl: 1 .  albuterol (PROVENTIL HFA;VENTOLIN HFA) 108 (90 Base) MCG/ACT inhaler, Inhale 2 puffs into the lungs every 6 (six) hours as needed for wheezing or shortness of breath., Disp: 1 Inhaler, Rfl: 3 .  aspirin 81 MG EC tablet, TAKE 1 TABLET BY MOUTH EVERY DAY, Disp: 90 tablet, Rfl: 1 .  Calcium Carbonate-Vitamin D (CALCIUM 600+D) 600-200 MG-UNIT TABS, Take 1 tablet by mouth daily., Disp: , Rfl:  .  clindamycin (CLEOCIN) 150 MG capsule, Take 1 capsule (150 mg total) by mouth 4 (four) times daily for 7 days., Disp: 28 capsule, Rfl: 0 .  digoxin (LANOXIN) 0.125 MG tablet, TAKE 1 TABLET EVERY DAY, Disp: 90 tablet, Rfl: 3 .  diltiazem (TIAZAC) 300 MG 24 hr capsule, Take 1 capsule (300 mg total) by mouth daily., Disp: 90 capsule, Rfl: 3 .  Docusate Sodium (COLACE PO), Take 50 mg by mouth at bedtime. , Disp: , Rfl:  .  donepezil (ARICEPT) 10 MG tablet, Take 20 mg by mouth daily. , Disp: ,  Rfl:  .  ferrous sulfate 325 (65 FE) MG tablet, Take 325 mg by mouth daily with breakfast., Disp: , Rfl:  .  furosemide (LASIX) 40 MG tablet, Take 40 mg by mouth daily. , Disp: , Rfl:  .  hydrocortisone (PROCTOSOL HC) 2.5 % rectal cream, Apply 1 application topically 3 (three) times daily., Disp: , Rfl:  .  ipratropium-albuterol (DUONEB) 0.5-2.5 (3) MG/3ML SOLN, Take 3 mLs by nebulization every 6 (six) hours as needed., Disp: 360 mL, Rfl: 0 .  naproxen sodium (ALEVE) 220 MG tablet, Take 220 mg by mouth., Disp: , Rfl:  .  potassium chloride SA (K-DUR,KLOR-CON) 20 MEQ tablet, Take 1 tablet (20 mEq total) by mouth daily., Disp: 30 tablet, Rfl: 3 .  QUEtiapine (SEROQUEL) 25 MG tablet, Take 1 tablet by mouth daily., Disp: , Rfl:  .  triamcinolone cream (KENALOG) 0.1 %, Apply 1 application topically 2 (two) times daily. Due to facial picking, Disp: 30 g, Rfl: 5 .  fluticasone (FLONASE) 50 MCG/ACT nasal spray, Place 1 spray into the nose daily., Disp: , Rfl:   Review of Systems  Constitutional: Negative.   Respiratory: Negative.   Cardiovascular: Negative.   Musculoskeletal: Negative.   Skin: Positive for color change, rash and wound.       Cellulitis   Neurological: Negative.     Social History  Tobacco Use  . Smoking status: Former Smoker    Packs/day: 1.00    Years: 50.00    Pack years: 50.00    Types: Cigarettes    Last attempt to quit: 04/14/2016    Years since quitting: 1.6  . Smokeless tobacco: Never Used  Substance Use Topics  . Alcohol use: No   Objective:   BP 140/68 (BP Location: Left Arm, Patient Position: Sitting, Cuff Size: Normal)   Pulse 66   Temp 98.5 F (36.9 C) (Oral)   Wt 148 lb (67.1 kg)   SpO2 96%   BMI 29.89 kg/m  Vitals:   11/28/17 1546  BP: 140/68  Pulse: 66  Temp: 98.5 F (36.9 C)  TempSrc: Oral  SpO2: 96%  Weight: 148 lb (67.1 kg)     Physical Exam  Constitutional: She appears well-developed and well-nourished. No distress.  HENT:  Head:  Normocephalic and atraumatic.  Eyes: Conjunctivae are normal. No scleral icterus.  Cardiovascular: Normal rate, regular rhythm, normal heart sounds and intact distal pulses.  No murmur heard. Pulmonary/Chest: Effort normal and breath sounds normal. No respiratory distress. She has no wheezes. She has no rales.  Musculoskeletal: She exhibits no edema.  Neurological: She is alert.  Skin: There is erythema.  Erythema of bilateral lower extremities to the mid shin with edema, tenderness to palpation, open weeping sloughing areas with mild purulent drainage See pictures below, area is more erythematous in person than it appears in the pictures  Psychiatric: She has a normal mood and affect. Her behavior is normal.  Vitals reviewed.            Assessment & Plan:   1. Cellulitis of lower extremity, unspecified laterality -Ongoing cellulitis that did not improve with doxycycline or clindamycin - Does not seem to be improving currently, but patient is not systemically ill - We will try a third outpatient treatment, but if this does not help, may need to consider hospitalization for IV antibiotics, which patient has required in the past - We will treat with doxycycline to cover MRSA as patient is institutionalized with history of MRSA and amoxicillin for better strep coverage -Will have close follow-up next week -We will refer to wound clinic for additional management - Try to avoid picking at the wounds, but this is difficult in this demented patient -Discussed return precautions with the patient's family - AMB referral to wound care center  2. Chronic venous insufficiency -Likely contributing to recurrent cellulitis - Will need to wear compression stockings if able to tolerate once her infection is cleared - AMB referral to wound care center    Meds ordered this encounter  Medications  . doxycycline (VIBRA-TABS) 100 MG tablet    Sig: Take 1 tablet (100 mg total) by mouth 2 (two)  times daily for 7 days.    Dispense:  14 tablet    Refill:  0  . amoxicillin (AMOXIL) 875 MG tablet    Sig: Take 1 tablet (875 mg total) by mouth 2 (two) times daily for 7 days.    Dispense:  14 tablet    Refill:  0     Return in about 1 week (around 12/05/2017) for cellultiis follow-up.   The entirety of the information documented in the History of Present Illness, Review of Systems and Physical Exam were personally obtained by me. Portions of this information were initially documented by Tiburcio Pea, CMA and reviewed by me for thoroughness and accuracy.    Virginia Crews, MD,  [facility] 11/28/2017 4:30 PM

## 2017-11-28 NOTE — Patient Instructions (Addendum)
Discontinue Clindamycin  Start Doxycycline 100mg  BID and Amoxicillin 875mg  BID x7d tonight  Will get appointment with wound care clinic   Cellulitis, Adult Cellulitis is a skin infection. The infected area is usually red and tender. This condition occurs most often in the arms and lower legs. The infection can travel to the muscles, blood, and underlying tissue and become serious. It is very important to get treated for this condition. What are the causes? Cellulitis is caused by bacteria. The bacteria enter through a break in the skin, such as a cut, burn, insect bite, open sore, or crack. What increases the risk? This condition is more likely to occur in people who:  Have a weak defense system (immune system).  Have open wounds on the skin such as cuts, burns, bites, and scrapes. Bacteria can enter the body through these open wounds.  Are older.  Have diabetes.  Have a type of long-lasting (chronic) liver disease (cirrhosis) or kidney disease.  Use IV drugs.  What are the signs or symptoms? Symptoms of this condition include:  Redness, streaking, or spotting on the skin.  Swollen area of the skin.  Tenderness or pain when an area of the skin is touched.  Warm skin.  Fever.  Chills.  Blisters.  How is this diagnosed? This condition is diagnosed based on a medical history and physical exam. You may also have tests, including:  Blood tests.  Lab tests.  Imaging tests.  How is this treated? Treatment for this condition may include:  Medicines, such as antibiotic medicines or antihistamines.  Supportive care, such as rest and application of cold or warm cloths (cold or warm compresses) to the skin.  Hospital care, if the condition is severe.  The infection usually gets better within 1-2 days of treatment. Follow these instructions at home:  Take over-the-counter and prescription medicines only as told by your health care provider.  If you were prescribed  an antibiotic medicine, take it as told by your health care provider. Do not stop taking the antibiotic even if you start to feel better.  Drink enough fluid to keep your urine clear or pale yellow.  Do not touch or rub the infected area.  Raise (elevate) the infected area above the level of your heart while you are sitting or lying down.  Apply warm or cold compresses to the affected area as told by your health care provider.  Keep all follow-up visits as told by your health care provider. This is important. These visits let your health care provider make sure a more serious infection is not developing. Contact a health care provider if:  You have a fever.  Your symptoms do not improve within 1-2 days of starting treatment.  Your bone or joint underneath the infected area becomes painful after the skin has healed.  Your infection returns in the same area or another area.  You notice a swollen bump in the infected area.  You develop new symptoms.  You have a general ill feeling (malaise) with muscle aches and pains. Get help right away if:  Your symptoms get worse.  You feel very sleepy.  You develop vomiting or diarrhea that persists.  You notice red streaks coming from the infected area.  Your red area gets larger or turns dark in color. This information is not intended to replace advice given to you by your health care provider. Make sure you discuss any questions you have with your health care provider. Document Released: 11/11/2004 Document  Revised: 06/12/2015 Document Reviewed: 12/11/2014 Elsevier Interactive Patient Education  [employer].

## 2017-12-05 ENCOUNTER — Encounter: Payer: Self-pay | Admitting: Emergency Medicine

## 2017-12-05 ENCOUNTER — Encounter: Payer: Self-pay | Admitting: Family Medicine

## 2017-12-05 ENCOUNTER — Ambulatory Visit (INDEPENDENT_AMBULATORY_CARE_PROVIDER_SITE_OTHER): Payer: Medicare HMO | Admitting: Family Medicine

## 2017-12-05 ENCOUNTER — Inpatient Hospital Stay
Admission: EM | Admit: 2017-12-05 | Discharge: 2017-12-06 | DRG: 603 | Disposition: A | Discharge status: Skilled Nursing Facility | Payer: Medicare HMO | Attending: Specialist | Admitting: Specialist

## 2017-12-05 ENCOUNTER — Emergency Department: Payer: Medicare HMO

## 2017-12-05 ENCOUNTER — Other Ambulatory Visit: Payer: Self-pay

## 2017-12-05 VITALS — BP 140/76 | HR 85 | Temp 98.2°F | Wt 147.8 lb

## 2017-12-05 DIAGNOSIS — I482 Chronic atrial fibrillation, unspecified: Secondary | ICD-10-CM | POA: Diagnosis present

## 2017-12-05 DIAGNOSIS — Z7982 Long term (current) use of aspirin: Secondary | ICD-10-CM

## 2017-12-05 DIAGNOSIS — Z23 Encounter for immunization: Secondary | ICD-10-CM | POA: Diagnosis not present

## 2017-12-05 DIAGNOSIS — I872 Venous insufficiency (chronic) (peripheral): Secondary | ICD-10-CM | POA: Diagnosis present

## 2017-12-05 DIAGNOSIS — Z87891 Personal history of nicotine dependence: Secondary | ICD-10-CM | POA: Diagnosis not present

## 2017-12-05 DIAGNOSIS — Z7951 Long term (current) use of inhaled steroids: Secondary | ICD-10-CM

## 2017-12-05 DIAGNOSIS — Z79899 Other long term (current) drug therapy: Secondary | ICD-10-CM

## 2017-12-05 DIAGNOSIS — H9190 Unspecified hearing loss, unspecified ear: Secondary | ICD-10-CM | POA: Diagnosis present

## 2017-12-05 DIAGNOSIS — R410 Disorientation, unspecified: Secondary | ICD-10-CM

## 2017-12-05 DIAGNOSIS — L03119 Cellulitis of unspecified part of limb: Secondary | ICD-10-CM

## 2017-12-05 DIAGNOSIS — F0390 Unspecified dementia without behavioral disturbance: Secondary | ICD-10-CM | POA: Diagnosis present

## 2017-12-05 DIAGNOSIS — E039 Hypothyroidism, unspecified: Secondary | ICD-10-CM | POA: Diagnosis present

## 2017-12-05 DIAGNOSIS — M810 Age-related osteoporosis without current pathological fracture: Secondary | ICD-10-CM | POA: Diagnosis present

## 2017-12-05 DIAGNOSIS — Z789 Other specified health status: Secondary | ICD-10-CM

## 2017-12-05 DIAGNOSIS — Z888 Allergy status to other drugs, medicaments and biological substances status: Secondary | ICD-10-CM

## 2017-12-05 DIAGNOSIS — Z882 Allergy status to sulfonamides status: Secondary | ICD-10-CM | POA: Diagnosis not present

## 2017-12-05 DIAGNOSIS — L03115 Cellulitis of right lower limb: Principal | ICD-10-CM | POA: Diagnosis present

## 2017-12-05 DIAGNOSIS — Z66 Do not resuscitate: Secondary | ICD-10-CM | POA: Diagnosis present

## 2017-12-05 DIAGNOSIS — R600 Localized edema: Secondary | ICD-10-CM | POA: Diagnosis not present

## 2017-12-05 DIAGNOSIS — D509 Iron deficiency anemia, unspecified: Secondary | ICD-10-CM | POA: Diagnosis present

## 2017-12-05 DIAGNOSIS — J449 Chronic obstructive pulmonary disease, unspecified: Secondary | ICD-10-CM | POA: Diagnosis not present

## 2017-12-05 DIAGNOSIS — K219 Gastro-esophageal reflux disease without esophagitis: Secondary | ICD-10-CM | POA: Diagnosis present

## 2017-12-05 DIAGNOSIS — I10 Essential (primary) hypertension: Secondary | ICD-10-CM | POA: Diagnosis not present

## 2017-12-05 DIAGNOSIS — L03116 Cellulitis of left lower limb: Secondary | ICD-10-CM | POA: Diagnosis present

## 2017-12-05 LAB — URINALYSIS, COMPLETE (UACMP) WITH MICROSCOPIC
BACTERIA UA: NONE SEEN
Bilirubin Urine: NEGATIVE
GLUCOSE, UA: NEGATIVE mg/dL
Hgb urine dipstick: NEGATIVE
KETONES UR: NEGATIVE mg/dL
LEUKOCYTES UA: NEGATIVE
NITRITE: NEGATIVE
PROTEIN: NEGATIVE mg/dL
SQUAMOUS EPITHELIAL / LPF: NONE SEEN (ref 0–5)
Specific Gravity, Urine: 1.01 (ref 1.005–1.030)
pH: 7 (ref 5.0–8.0)

## 2017-12-05 LAB — COMPREHENSIVE METABOLIC PANEL
ALBUMIN: 4.2 g/dL (ref 3.5–5.0)
ALK PHOS: 93 U/L (ref 38–126)
ALT: 13 U/L (ref 0–44)
ANION GAP: 9 (ref 5–15)
AST: 20 U/L (ref 15–41)
BUN: 12 mg/dL (ref 8–23)
CALCIUM: 9.7 mg/dL (ref 8.9–10.3)
CHLORIDE: 97 mmol/L — AB (ref 98–111)
CO2: 33 mmol/L — ABNORMAL HIGH (ref 22–32)
Creatinine, Ser: 0.66 mg/dL (ref 0.44–1.00)
GFR calc Af Amer: >60 mL/min (ref >60)
GFR calc non Af Amer: >60 mL/min (ref >60)
GLUCOSE: 104 mg/dL — AB (ref 70–99)
Potassium: 3.7 mmol/L (ref 3.5–5.1)
SODIUM: 139 mmol/L (ref 135–145)
Total Bilirubin: 0.6 mg/dL (ref 0.3–1.2)
Total Protein: 7.8 g/dL (ref 6.5–8.1)

## 2017-12-05 LAB — CBC WITH DIFFERENTIAL/PLATELET
ABS IMMATURE GRANULOCYTES: 0.02 10*3/uL (ref 0.00–0.07)
BASOS ABS: 0.1 10*3/uL (ref 0.0–0.1)
Basophils Relative: 1 %
EOS ABS: 0.3 10*3/uL (ref 0.0–0.5)
Eosinophils Relative: 3 %
HEMATOCRIT: 38.5 % (ref 36.0–46.0)
Hemoglobin: 12.2 g/dL (ref 12.0–15.0)
IMMATURE GRANULOCYTES: 0 %
Lymphocytes Relative: 18 %
Lymphs Abs: 1.4 10*3/uL (ref 0.7–4.0)
MCH: 29.3 pg (ref 26.0–34.0)
MCHC: 31.7 g/dL (ref 30.0–36.0)
MCV: 92.5 fL (ref 80.0–100.0)
MONOS PCT: 9 %
Monocytes Absolute: 0.7 10*3/uL (ref 0.1–1.0)
NEUTROS PCT: 69 %
NRBC: 0 % (ref 0.0–0.2)
Neutro Abs: 5.4 10*3/uL (ref 1.7–7.7)
Platelets: 339 10*3/uL (ref 150–400)
RBC: 4.16 MIL/uL (ref 3.87–5.11)
RDW: 12.8 % (ref 11.5–15.5)
WBC: 7.9 10*3/uL (ref 4.0–10.5)

## 2017-12-05 LAB — BRAIN NATRIURETIC PEPTIDE: B Natriuretic Peptide: 138 pg/mL — ABNORMAL HIGH (ref 0.0–100.0)

## 2017-12-05 LAB — LACTIC ACID, PLASMA: LACTIC ACID, VENOUS: 1.1 mmol/L (ref 0.5–1.9)

## 2017-12-05 MED ORDER — PNEUMOCOCCAL VAC POLYVALENT 25 MCG/0.5ML IJ INJ
0.5000 mL | INJECTION | INTRAMUSCULAR | Status: AC
  Administered 2017-12-06: 0.5 mL via INTRAMUSCULAR
  Filled 2017-12-05: qty 0.5

## 2017-12-05 MED ORDER — HEPARIN SODIUM (PORCINE) 5000 UNIT/ML IJ SOLN
5000.0000 [IU] | Freq: Three times a day (TID) | INTRAMUSCULAR | Status: DC
  Administered 2017-12-05: 5000 [IU] via SUBCUTANEOUS
  Filled 2017-12-05 (×2): qty 1

## 2017-12-05 MED ORDER — POTASSIUM CHLORIDE CRYS ER 20 MEQ PO TBCR
20.0000 meq | EXTENDED_RELEASE_TABLET | Freq: Every day | ORAL | Status: DC
  Administered 2017-12-06: 20 meq via ORAL
  Filled 2017-12-05: qty 1

## 2017-12-05 MED ORDER — FUROSEMIDE 10 MG/ML IJ SOLN
20.0000 mg | Freq: Three times a day (TID) | INTRAMUSCULAR | Status: DC
  Administered 2017-12-06 (×2): 20 mg via INTRAVENOUS
  Filled 2017-12-05 (×2): qty 4

## 2017-12-05 MED ORDER — FERROUS SULFATE 325 (65 FE) MG PO TABS
325.0000 mg | ORAL_TABLET | Freq: Every day | ORAL | Status: DC
  Administered 2017-12-06: 325 mg via ORAL
  Filled 2017-12-05: qty 1

## 2017-12-05 MED ORDER — CLINDAMYCIN PHOSPHATE 600 MG/50ML IV SOLN
600.0000 mg | Freq: Once | INTRAVENOUS | Status: AC
  Administered 2017-12-05: 600 mg via INTRAVENOUS
  Filled 2017-12-05 (×2): qty 50

## 2017-12-05 MED ORDER — DILTIAZEM HCL ER COATED BEADS 300 MG PO CP24
300.0000 mg | ORAL_CAPSULE | Freq: Every day | ORAL | Status: DC
  Administered 2017-12-06: 300 mg via ORAL
  Filled 2017-12-05 (×2): qty 1

## 2017-12-05 MED ORDER — ALBUTEROL SULFATE (2.5 MG/3ML) 0.083% IN NEBU: 2.5000 mg | INHALATION_SOLUTION | Freq: Four times a day (QID) | RESPIRATORY_TRACT | Status: DC | PRN

## 2017-12-05 MED ORDER — ASPIRIN EC 81 MG PO TBEC
81.0000 mg | DELAYED_RELEASE_TABLET | Freq: Every day | ORAL | Status: DC
  Administered 2017-12-06: 81 mg via ORAL
  Filled 2017-12-05: qty 1

## 2017-12-05 MED ORDER — FUROSEMIDE 10 MG/ML IJ SOLN
40.0000 mg | Freq: Once | INTRAMUSCULAR | Status: AC
  Administered 2017-12-05: 40 mg via INTRAVENOUS
  Filled 2017-12-05: qty 4

## 2017-12-05 MED ORDER — DONEPEZIL HCL 5 MG PO TABS
20.0000 mg | ORAL_TABLET | Freq: Every day | ORAL | Status: DC
  Administered 2017-12-06: 20 mg via ORAL
  Filled 2017-12-05 (×2): qty 4

## 2017-12-05 MED ORDER — QUETIAPINE FUMARATE 25 MG PO TABS
25.0000 mg | ORAL_TABLET | Freq: Every day | ORAL | Status: DC
  Administered 2017-12-06: 25 mg via ORAL
  Filled 2017-12-05: qty 1

## 2017-12-05 MED ORDER — INFLUENZA VAC SPLIT HIGH-DOSE 0.5 ML IM SUSY
0.5000 mL | PREFILLED_SYRINGE | INTRAMUSCULAR | Status: AC
  Administered 2017-12-06: 0.5 mL via INTRAMUSCULAR
  Filled 2017-12-05: qty 0.5

## 2017-12-05 MED ORDER — DIGOXIN 125 MCG PO TABS
125.0000 ug | ORAL_TABLET | Freq: Every day | ORAL | Status: DC
  Administered 2017-12-06: 125 ug via ORAL
  Filled 2017-12-05 (×2): qty 1

## 2017-12-05 MED ORDER — IPRATROPIUM-ALBUTEROL 0.5-2.5 (3) MG/3ML IN SOLN: 3.0000 mL | Freq: Four times a day (QID) | RESPIRATORY_TRACT | Status: DC | PRN

## 2017-12-05 MED ORDER — CALCIUM CARBONATE-VITAMIN D 500-200 MG-UNIT PO TABS
1.0000 | ORAL_TABLET | Freq: Every day | ORAL | Status: DC
  Administered 2017-12-06: 1 via ORAL
  Filled 2017-12-05: qty 1

## 2017-12-05 NOTE — Progress Notes (Signed)
Patient: Patricia Foley Female    DOB: 1931/08/23   84 y.o.   MRN: [id number] Visit Date: 12/05/2017  Today's Provider: Lavon Paganini, MD   Chief Complaint  Patient presents with  . Cellulitis   Subjective:    HPI Cellulitis:  Patient presents for a 1 week follow up. Last OV was on 11/28/2017. Patient started Doxycycline and Augmentin (3rd round of antibiotic - previously tried Doxycycline alone and Cipro) and was referred to wound clinic. Symptoms are unchanged per grand daughter. Granddaughter states she believes patient may need to be hospitalized for IV antibiotics.     Allergies  Allergen Reactions  . Sulfa Antibiotics Hives  . Coumadin [Warfarin] Rash     Current Outpatient Medications:  .  acetaminophen (TYLENOL) 325 MG tablet, Take 2 tablets (650 mg total) by mouth every 6 (six) hours as needed for mild pain (or Fever >/= 101)., Disp: 90 tablet, Rfl: 1 .  albuterol (PROVENTIL HFA;VENTOLIN HFA) 108 (90 Base) MCG/ACT inhaler, Inhale 2 puffs into the lungs every 6 (six) hours as needed for wheezing or shortness of breath., Disp: 1 Inhaler, Rfl: 3 .  amoxicillin (AMOXIL) 875 MG tablet, Take 1 tablet (875 mg total) by mouth 2 (two) times daily for 7 days., Disp: 14 tablet, Rfl: 0 .  aspirin 81 MG EC tablet, TAKE 1 TABLET BY MOUTH EVERY DAY, Disp: 90 tablet, Rfl: 1 .  Calcium Carbonate-Vitamin D (CALCIUM 600+D) 600-200 MG-UNIT TABS, Take 1 tablet by mouth daily., Disp: , Rfl:  .  digoxin (LANOXIN) 0.125 MG tablet, TAKE 1 TABLET EVERY DAY, Disp: 90 tablet, Rfl: 3 .  diltiazem (TIAZAC) 300 MG 24 hr capsule, Take 1 capsule (300 mg total) by mouth daily., Disp: 90 capsule, Rfl: 3 .  Docusate Sodium (COLACE PO), Take 50 mg by mouth at bedtime. , Disp: , Rfl:  .  donepezil (ARICEPT) 10 MG tablet, Take 20 mg by mouth daily. , Disp: , Rfl:  .  doxycycline (VIBRA-TABS) 100 MG tablet, Take 1 tablet (100 mg total) by mouth 2 (two) times daily for 7 days., Disp: 14 tablet, Rfl:  0 .  ferrous sulfate 325 (65 FE) MG tablet, Take 325 mg by mouth daily with breakfast., Disp: , Rfl:  .  furosemide (LASIX) 40 MG tablet, Take 40 mg by mouth daily. , Disp: , Rfl:  .  hydrocortisone (PROCTOSOL HC) 2.5 % rectal cream, Apply 1 application topically 3 (three) times daily., Disp: , Rfl:  .  ipratropium-albuterol (DUONEB) 0.5-2.5 (3) MG/3ML SOLN, Take 3 mLs by nebulization every 6 (six) hours as needed., Disp: 360 mL, Rfl: 0 .  naproxen sodium (ALEVE) 220 MG tablet, Take 220 mg by mouth., Disp: , Rfl:  .  potassium chloride SA (K-DUR,KLOR-CON) 20 MEQ tablet, Take 1 tablet (20 mEq total) by mouth daily., Disp: 30 tablet, Rfl: 3 .  QUEtiapine (SEROQUEL) 25 MG tablet, Take 1 tablet by mouth daily., Disp: , Rfl:  .  triamcinolone cream (KENALOG) 0.1 %, Apply 1 application topically 2 (two) times daily. Due to facial picking, Disp: 30 g, Rfl: 5 .  fluticasone (FLONASE) 50 MCG/ACT nasal spray, Place 1 spray into the nose daily., Disp: , Rfl:   Review of Systems  Constitutional: Negative.   Respiratory: Negative.   Cardiovascular: Negative.   Musculoskeletal: Negative.   Skin:       Cellulitis     Social History   Tobacco Use  . Smoking status: Former Smoker    Packs/day:  1.00    Years: 50.00    Pack years: 50.00    Types: Cigarettes    Last attempt to quit: 04/14/2016    Years since quitting: 1.6  . Smokeless tobacco: Never Used  Substance Use Topics  . Alcohol use: No   Objective:   BP 140/76 (BP Location: Left Arm, Patient Position: Sitting, Cuff Size: Normal)   Pulse 85   Temp 98.2 F (36.8 C) (Oral)   Wt 147 lb 12.8 oz (67 kg)   SpO2 93%   BMI 29.85 kg/m  Vitals:   12/05/17 1325  BP: 140/76  Pulse: 85  Temp: 98.2 F (36.8 C)  TempSrc: Oral  SpO2: 93%  Weight: 147 lb 12.8 oz (67 kg)     Physical Exam  Constitutional: She appears well-developed and well-nourished. No distress.  Cardiovascular: Normal rate, regular rhythm and intact distal pulses.   Pulmonary/Chest: Effort normal and breath sounds normal. No respiratory distress. She has no wheezes. She has no rales.  Musculoskeletal: She exhibits edema.  Neurological: She is alert.  Skin: Skin is warm. There is erythema (erythematous b/l LEs, TTP, open oozing wounds with purulent drainage).  Vitals reviewed.       Assessment & Plan:   1. Cellulitis of lower extremity, unspecified laterality 2. Failure of outpatient treatment - cellulitis of b/l LEs despite trial of 3 outpatient antibiotics - patient does have chronic stasis dermatitis and chronic LE edema at baseline, but legs are much worse from baseline - she has been hospitalized for cellulitis in the past - she has no signs of systemic illness - believe that she will need admission for IV abx so will send to hospital by private vehicle.   Return if symptoms worsen or fail to improve.   The entirety of the information documented in the History of Present Illness, Review of Systems and Physical Exam were personally obtained by me. Portions of this information were initially documented by Tiburcio Pea, CMA and reviewed by me for thoroughness and accuracy.    Virginia Crews, MD, MPH [facility] 12/05/2017 2:40 PM

## 2017-12-05 NOTE — ED Triage Notes (Signed)
Pt here with granddaughter whose husband is the patient's legal guardian who is currently at work, but aware the patient is being seen in the ED.  Pt has been on multiple atbx of cellulitis of the lower extremities off and on for 3 weeks. Granddaughter states the cellulitis is worse. Pt has redness and swelling noted to bilateral lower extremities and feet.    Granddaughter also states the patient is more confused than her usual baseline for the past 2 weeks.

## 2017-12-05 NOTE — Progress Notes (Signed)
Family Meeting Note  Advance Directive yes  Today a meeting took place with the granddter  It is brought in with bilateral worsening lower extremity stasis edema/venous insufficiency. She has long-standing history of questionable lymphedema versus bilateral lower extremity venous insufficiency has had several rounds of antibiotics for the same. Admitted for IV diuresis to see if it helps with edema, wound consult, leg elevation, vascular consultation if needed  Status discussed with granddaughter. Patient is a dnr  Time spent during discussion 17 minutes  Fritzi Mandes, MD

## 2017-12-05 NOTE — H&P (Signed)
[facility] [place] NAME: Patricia Foley    MR#:  [id number]  DATE OF BIRTH:  08/01/1931  DATE OF ADMISSION:  12/05/2017  PRIMARY CARE PHYSICIAN: Virginia Crews, MD   REQUESTING/REFERRING PHYSICIAN: Dr. Mariea Clonts  CHIEF COMPLAINT:  bilateral lower extremity edema  pt has dementia. History is obtained from ER physician PCP notes and granddaughter in the emergency room  HISTORY OF PRESENT ILLNESS:  Patricia Foley  is a 84 y.o. female with a known history of chronic bilateral lower extremity edema/stasis dermatitis/history of varicose veins/venous stasis, chronic atrial fibrillation not on anticoagulation, dementia, hypothyroidism and in from primary care physician's office for further evaluation of management  Patient according to PCPs notes has had several rounds of antibiotics oral. She has bilateral lower extremity edema and is losing some suitors fluid. No fever at home or in the ER.  Patient is being admitted for worsening bilateral lower extremity edema.  According to the granddaughter patient does not keep her legs elevated. She walks at home all the time. She does have a wound clinic appointment coming Friday.  PAST MEDICAL HISTORY:   Past Medical History:  Diagnosis Date  . Anemia   . Arthritis   . Atrial fibrillation ([place])   . Circulation problem   . COPD (chronic obstructive pulmonary disease) ([place])   . Cough    CHRONIC  . Dementia ([place])   . GERD (gastroesophageal reflux disease)   . HOH (hard of hearing)   . Hypertension   . Hypothyroidism   . Tremors of nervous system   . Venous insufficiency     PAST SURGICAL HISTOIRY:   Past Surgical History:  Procedure Laterality Date  . ABDOMINAL HYSTERECTOMY     benign indication  . CATARACT EXTRACTION W/PHACO Right 05/04/2016   Procedure: CATARACT EXTRACTION PHACO AND INTRAOCULAR LENS PLACEMENT (IOC);  Surgeon: Birder Robson, MD;  Location: [facility] ORS;   Service: Ophthalmology;  Laterality: Right;  [place] 3:02.7 AP% 23.1 CDE 42.15 Fluid pack lot # [id number]  . CATARACT EXTRACTION W/PHACO Left 06/29/2016   Procedure: CATARACT EXTRACTION PHACO AND INTRAOCULAR LENS PLACEMENT (IOC);  Surgeon: Birder Robson, MD;  Location: [facility] ORS;  Service: Ophthalmology;  Laterality: Left;  [place] 2:53.3 AP% 27.7 CDE 48.16 Fluid Pack Lot # [id number]    SOCIAL HISTORY:   Social History   Tobacco Use  . Smoking status: Former Smoker    Packs/day: 1.00    Years: 50.00    Pack years: 50.00    Types: Cigarettes    Last attempt to quit: 04/14/2016    Years since quitting: 1.6  . Smokeless tobacco: Never Used  Substance Use Topics  . Alcohol use: No    FAMILY HISTORY:   Family History  Problem Relation Age of Onset  . Heart disease Father   . Heart disease Mother   . Heart disease Sister   . Heart disease Brother   . Dementia Brother   . Heart attack Son 24  . Dementia Brother     DRUG ALLERGIES:   Allergies  Allergen Reactions  . Sulfa Antibiotics Hives  . Coumadin [Warfarin] Rash    REVIEW OF SYSTEMS:  Review of Systems  Constitutional: Negative for chills, fever and weight loss.  HENT: Negative for ear discharge, ear pain and nosebleeds.   Eyes: Negative for blurred vision, pain and discharge.  Respiratory: Negative for sputum production, shortness of breath, wheezing and stridor.   Cardiovascular: Positive for leg swelling.  Negative for chest pain, palpitations, orthopnea and PND.  Gastrointestinal: Negative for abdominal pain, diarrhea, nausea and vomiting.  Genitourinary: Negative for frequency and urgency.  Musculoskeletal: Negative for back pain and joint pain.  Neurological: Positive for weakness. Negative for sensory change, speech change and focal weakness.  Psychiatric/Behavioral: Negative for depression and hallucinations. The patient is not nervous/anxious.      MEDICATIONS AT HOME:   Prior to Admission medications    Medication Sig Start Date End Date Taking? Authorizing Provider  acetaminophen (TYLENOL) 325 MG tablet Take 2 tablets (650 mg total) by mouth every 6 (six) hours as needed for mild pain (or Fever >/= 101). 12/17/16   Mar Daring, PA-C  albuterol (PROVENTIL HFA;VENTOLIN HFA) 108 (90 Base) MCG/ACT inhaler Inhale 2 puffs into the lungs every 6 (six) hours as needed for wheezing or shortness of breath. 11/11/16   Virginia Crews, MD  amoxicillin (AMOXIL) 875 MG tablet Take 1 tablet (875 mg total) by mouth 2 (two) times daily for 7 days. 11/28/17 12/05/17  Virginia Crews, MD  aspirin 81 MG EC tablet TAKE 1 TABLET BY MOUTH EVERY DAY 06/27/17   Bacigalupo, Dionne Bucy, MD  Calcium Carbonate-Vitamin D (CALCIUM 600+D) 600-200 MG-UNIT TABS Take 1 tablet by mouth daily.    [provider]  digoxin (LANOXIN) 0.125 MG tablet TAKE 1 TABLET EVERY DAY 09/23/17   Bacigalupo, Dionne Bucy, MD  diltiazem (TIAZAC) 300 MG 24 hr capsule Take 1 capsule (300 mg total) by mouth daily. 06/15/17   Virginia Crews, MD  Docusate Sodium (COLACE PO) Take 50 mg by mouth at bedtime.     [provider]  donepezil (ARICEPT) 10 MG tablet Take 20 mg by mouth daily.     [provider]  doxycycline (VIBRA-TABS) 100 MG tablet Take 1 tablet (100 mg total) by mouth 2 (two) times daily for 7 days. 11/28/17 12/05/17  Virginia Crews, MD  ferrous sulfate 325 (65 FE) MG tablet Take 325 mg by mouth daily with breakfast.    [provider]  fluticasone (FLONASE) 50 MCG/ACT nasal spray Place 1 spray into the nose daily. 02/04/16 09/21/17  [provider]  furosemide (LASIX) 40 MG tablet Take 40 mg by mouth daily.     [provider]  hydrocortisone (PROCTOSOL HC) 2.5 % rectal cream Apply 1 application topically 3 (three) times daily. 10/05/16   [provider]  ipratropium-albuterol (DUONEB) 0.5-2.5 (3) MG/3ML SOLN Take 3 mLs by nebulization every 6 (six) hours as  needed. 08/14/17   [name], MD  naproxen sodium (ALEVE) 220 MG tablet Take 220 mg by mouth.    [provider]  potassium chloride SA (K-DUR,KLOR-CON) 20 MEQ tablet Take 1 tablet (20 mEq total) by mouth daily. 10/31/17   Virginia Crews, MD  QUEtiapine (SEROQUEL) 25 MG tablet Take 1 tablet by mouth daily. 09/07/16   [provider]  triamcinolone cream (KENALOG) 0.1 % Apply 1 application topically 2 (two) times daily. Due to facial picking 08/25/17   Virginia Crews, MD      VITAL SIGNS:  Blood pressure (!) 114/53, pulse 71, temperature 98.4 F (36.9 C), temperature source Oral, resp. rate 18, height 4\' 11"  (1.499 m), weight 67 kg, SpO2 98 %.  PHYSICAL EXAMINATION:  GENERAL:  84 y.o.-year-old patient lying in the bed with no acute distress.  EYES: Pupils equal, round, reactive to light and accommodation. No scleral icterus. Extraocular muscles intact.  HEENT: Head atraumatic, normocephalic.  Oropharynx and nasopharynx clear.  NECK:  Supple, no jugular venous distention. No thyroid enlargement, no tenderness.  LUNGS: Normal breath sounds bilaterally, no wheezing, rales,rhonchi or crepitation. No use of accessory muscles of respiration.  CARDIOVASCULAR: S1, S2 normal. No murmurs, rubs, or gallops.  ABDOMEN: Soft, nontender, nondistended. Bowel sounds present. No organomegaly or mass.  EXTREMITIES: bilateral 4+ pitting edema  patient has dry skin on the plantar side of both feet. She has some mild erythema on the dorsal aspect of the foot. Some sersnagiunous discharge. No foul smelling discharge.  NEUROLOGIC: Cranial nerves II through XII are intact. Muscle strength 5/5 in all extremities. Sensation intact. Gait not checked.  PSYCHIATRIC: The patient is alert and oriented toperson SKIN: No obvious rash, lesion, or ulcer.   LABORATORY PANEL:   CBC Recent Labs  Lab 12/05/17 1504  WBC 7.9  HGB 12.2  HCT 38.5  PLT 339    ------------------------------------------------------------------------------------------------------------------  Chemistries  Recent Labs  Lab 12/05/17 1504  NA 139  K 3.7  CL 97*  CO2 33*  GLUCOSE 104*  BUN 12  CREATININE 0.66  CALCIUM 9.7  AST 20  ALT 13  ALKPHOS 93  BILITOT 0.6   ------------------------------------------------------------------------------------------------------------------  Cardiac Enzymes No results for input(s): TROPONINI in the last 168 hours. ------------------------------------------------------------------------------------------------------------------  RADIOLOGY:  No results found.  EKG:    IMPRESSION AND PLAN:   Arwilda [place]  is a 84 y.o. female with a known history of chronic bilateral lower extremity edema/stasis dermatitis/history of varicose veins/venous stasis, chronic atrial fibrillation not on anticoagulation, dementia, hypothyroidism and in from primary care physician's office for further evaluation of management  1. bilateral acute on chronic lower extremity edema suspected due to chronic venous insufficiency/stasis dermatitis as noted in PCP notes for last few years -patient is afebrile, no tachycardia, there is mild erythema in the lower extremity does not look like true cellulitis. She has some beeping sersanguinous fluid suspected due to stasis dermatitis and does not appear to be true cellulitis -received one dose of IV clindamycin -her white count is normal lactic acid is normal -I am holding of antibiotic at present this was discussed with the granddaughter -I'm going to give a trial of IV Lasix to help remove some of the fluid and see if she feels better with the tightness. Monitor input output and creatinine -the patient takes 40 mg of Lasix daily at home -wound consult--- to see if patient is a candidate for ace wrap -keep legs elevated -echo of the heart done in the past showed EF of 55 to 60% -sats are normal  on room air -consider vascular consultation as outpatient if no improvement  2. dementia continue home meds  3. Chronic atrial fibrillation continue digoxin and diltiazem  4. DVT prophylaxis subcu heparin   Medication reconciliation has not been done since there is no pharmacy tech available today in the ER.    All the records are reviewed and case discussed with ED provider. Management plans discussed with the patient, family and they are in agreement.  CODE STATUS:dnr  TOTAL TIME TAKING CARE OF THIS PATIENT: *50 minutes.    Fritzi Mandes M.D on 12/05/2017 at 4:56 PM  Between 7am to 6pm - Pager - [phone]  After 6pm go to [email or web address] - password [email or web address] [facility]  [facility]  Office  [phone]  CC: Primary care physician; Virginia Crews, MD

## 2017-12-05 NOTE — Patient Instructions (Signed)
Recommend IV antibiotic therapy as 3 rounds of antibiotics have failed

## 2017-12-05 NOTE — ED Notes (Signed)
Patient transported to Ultrasound 

## 2017-12-05 NOTE — ED Provider Notes (Signed)
[facility] Emergency Department Provider Note  ____________________________________________  Time seen: Approximately 4:17 PM  I have reviewed the triage vital signs and the nursing notes.   HISTORY  Chief Complaint Cellulitis and Altered Mental Status    HPI Patricia Foley is a 84 y.o. female with a history of dementia living at [facility], A. fib, COPD, presenting with bilateral lower extremity cellulitis failing outpatient antibiotics.  The patient is unable to give most of the history but is accompanied by her granddaughter, who knows her medical history very well.  Per report, she has had bilateral lower extremity swelling, erythema, now some areas of skin breakdown with weeping for the past 3 weeks.  She has been tried on amoxicillin and doxycycline, with continued worsening of her symptoms.  She has not had any difficulty walking, and denies any calf pain, chest pain or shortness of breath.  She has had some confusion; as an example, her granddaughter states that she will eat a meal, and then ask for that meal again, which is not her normal mental status.  She has been eating and drinking well, and has not had any fevers or chills.  She has had a 30 pound weight gain and has a history of CHF; she has not had any recent change in her furosemide dosing.  No exertional chest pain or exertional shortness of breath  Past Medical History:  Diagnosis Date  . Anemia   . Arthritis   . Atrial fibrillation ([place])   . Circulation problem   . COPD (chronic obstructive pulmonary disease) ([place])   . Cough    CHRONIC  . Dementia ([place])   . GERD (gastroesophageal reflux disease)   . HOH (hard of hearing)   . Hypertension   . Hypothyroidism   . Tremors of nervous system   . Venous insufficiency     Patient Active Problem List   Diagnosis Date Noted  . Community acquired pneumonia 08/24/2017  . (HFpEF) heart failure with preserved ejection fraction ([place]) 08/23/2017   . Acute respiratory failure with hypoxia ([place]) 08/23/2017  . SOB (shortness of breath) 08/10/2017  . Nonhealing nonsurgical wound 05/11/2017  . Callus of foot 05/11/2017  . Urinary frequency 02/10/2017  . COPD (chronic obstructive pulmonary disease) ([place])   . Dementia ([place])   . GERD (gastroesophageal reflux disease)   . Hypertension   . Tremors of nervous system   . Hypothyroidism   . Atrial fibrillation ([place])   . HOH (hard of hearing)   . Anemia   . Arthritis   . Arthritis of ankle, right 05/28/2016  . Essential tremor 03/27/2016  . Chronic insomnia 03/27/2016  . Chronic venous insufficiency 10/04/2015  . Hemorrhoids without complication 50/10/3816  . Late onset Alzheimer's disease without behavioral disturbance ([place]) 03/05/2015  . Bilateral edema of lower extremity 03/05/2015  . Varicose veins of both legs with edema 09/07/2014  . Current tobacco use 10/11/2013    Past Surgical History:  Procedure Laterality Date  . ABDOMINAL HYSTERECTOMY     benign indication  . CATARACT EXTRACTION W/PHACO Right 05/04/2016   Procedure: CATARACT EXTRACTION PHACO AND INTRAOCULAR LENS PLACEMENT (IOC);  Surgeon: Birder Robson, MD;  Location: [facility] ORS;  Service: Ophthalmology;  Laterality: Right;  [place] 3:02.7 AP% 23.1 CDE 42.15 Fluid pack lot # [id number]  . CATARACT EXTRACTION W/PHACO Left 06/29/2016   Procedure: CATARACT EXTRACTION PHACO AND INTRAOCULAR LENS PLACEMENT (IOC);  Surgeon: Birder Robson, MD;  Location: [facility] ORS;  Service: Ophthalmology;  Laterality: Left;  [place] 2:53.3 AP% 27.7 CDE 48.16 Fluid Pack Lot # [id number]    Current Outpatient Rx  . Order #: [id number] Class: Normal  . Order #: [id number] Class: Normal  . Order #: [id number] Class: Normal  . Order #: [id number] Class: Normal  . Order #: [id number] Class: Historical Med  . Order #: [id number] Class: Normal  . Order #: [id number] Class: Normal  . Order #: [id number] Class: Historical Med  . Order #: [id number] Class: Historical Med   . Order #: [id number] Class: Normal  . Order #: [id number] Class: Historical Med  . Order #: [id number] Class: Historical Med  . Order #: [id number] Class: Historical Med  . Order #: [id number] Class: Historical Med  . Order #: [id number] Class: Print  . Order #: [id number] Class: Historical Med  . Order #: [id number] Class: Normal  . Order #: [id number] Class: Historical Med  . Order #: [id number] Class: Normal    Allergies Sulfa antibiotics and Coumadin [warfarin]  Family History  Problem Relation Age of Onset  . Heart disease Father   . Heart disease Mother   . Heart disease Sister   . Heart disease Brother   . Dementia Brother   . Heart attack Son 19  . Dementia Brother     Social History Social History   Tobacco Use  . Smoking status: Former Smoker    Packs/day: 1.00    Years: 50.00    Pack years: 50.00    Types: Cigarettes    Last attempt to quit: 04/14/2016    Years since quitting: 1.6  . Smokeless tobacco: Never Used  Substance Use Topics  . Alcohol use: No  . Drug use: No    Review of Systems Constitutional: No fever/chills.  Positive confusion. Eyes: No visual changes.  No eye discharge. ENT: No sore throat. No congestion or rhinorrhea. Cardiovascular: Denies chest pain. Denies palpitations. Respiratory: Denies shortness of breath.  Denies any exertional shortness of breath.  No cough. Gastrointestinal: No abdominal pain.  No nausea, no vomiting.  No diarrhea.  No constipation. Genitourinary: Negative for dysuria.  Possible urinary frequency. Musculoskeletal: Negative for back pain. Skin: Positive bilateral lower extremely swelling with overlying erythema and weeping. Neurological: Negative for headaches. No focal numbness, tingling or weakness.     ____________________________________________   PHYSICAL EXAM:  VITAL SIGNS: ED Triage Vitals [12/05/17 1455]  Enc Vitals Group     BP (!) 114/53     Pulse Rate 71     Resp 18     Temp 98.4 F (36.9 C)     Temp  Source Oral     SpO2 98 %     Weight 147 lb 12.8 oz (67 kg)     Height 4\' 11"  (1.499 m)     Head Circumference      Peak Flow      Pain Score      Pain Loc      Pain Edu?      Excl. in [place]?     Constitutional: Alert and able to answer some questions.  She is able to follow commands. Eyes: Conjunctivae are normal.  EOMI. No scleral icterus. Head: Atraumatic. Nose: No congestion/rhinnorhea. Mouth/Throat: Mucous membranes are moist.  Neck: No stridor.  Supple.  No JVD. Cardiovascular: Normal rate, regular rhythm. No murmurs, rubs or gallops.  Respiratory: Normal respiratory effort.  No accessory muscle use or retractions. Lungs CTAB.  No wheezes, rales or ronchi. Gastrointestinal: Soft, nontender and mildly distended.  Positive palpable edema in the abdominal wall..  No guarding or rebound.  No peritoneal signs. Musculoskeletal: Positive bilateral  symmetric pitting edema to the proximal tibial shaft with overlying erythema to the mid tibial shaft.  There is not any particular warmth to the patient's lower extremities.  She has normal DP and PT pulses bilaterally.  She has several areas on each leg of a yellow crusty weeping.  No active extravasation on my examination. Neurologic: alert.Marland Kitchen  Speech is clear.  Face and smile are symmetric.  EOMI.  Moves all extremities well. Skin:  Skin is warm, dry and intact. No rash noted. Psychiatric: Mood and affect are normal. ____________________________________________   LABS (all labs ordered are listed, but only abnormal results are displayed)  Labs Reviewed  COMPREHENSIVE METABOLIC PANEL - Abnormal; Notable for the following components:      Result Value   Chloride 97 (*)    CO2 33 (*)    Glucose, Bld 104 (*)    All other components within normal limits  URINALYSIS, COMPLETE (UACMP) WITH MICROSCOPIC - Abnormal; Notable for the following components:   Color, Urine YELLOW (*)    APPearance HAZY (*)    All other components within normal limits   LACTIC ACID, PLASMA  CBC WITH DIFFERENTIAL/PLATELET  LACTIC ACID, PLASMA  BRAIN NATRIURETIC PEPTIDE   ____________________________________________  EKG  ED ECG REPORT I, Anne-Caroline Mariea Clonts, the attending physician, personally viewed and interpreted this ECG.   Date: 12/05/2017  EKG Time: 1642  Rate: 63  Rhythm: normal sinus rhythm; RBBB  Axis: normal  Intervals:none  ST&T Change: No STEMI  ____________________________________________  RADIOLOGY  No results found.  ____________________________________________   PROCEDURES  Procedure(s) performed: None  Procedures  Critical Care performed: No ____________________________________________   INITIAL IMPRESSION / ASSESSMENT AND PLAN / ED COURSE  Pertinent labs & imaging results that were available during my care of the patient were reviewed by me and considered in my medical decision making (see chart for details).  84 y.o. female with a history of A. fib and COPD, dementia, prior cellulitis, presenting with 3 weeks of progressively worsening bilateral lower extreme the cellulitis despite dual oral antibiotic therapy and unchanged diuretic.  Overall, the patient's presentation could be consistent with cellulitis although she does not have a significant amount of warmth and certainly has no systemic illness symptoms.  Other possible etiologies include skin changes related to edema from CHF exacerbation.  However, the patient is not having any shortness of breath chest pain.  We will also ultrasound her legs to rule out DVT; this would be less likely given the dual nature of her symptoms.  At this time, the patient will receive furosemide, as well as clindamycin, and plan admission.  ____________________________________________  FINAL CLINICAL IMPRESSION(S) / ED DIAGNOSES  Final diagnoses:  Bilateral cellulitis of lower leg  Confusion         NEW MEDICATIONS STARTED DURING THIS VISIT:  New Prescriptions   No  medications on file      Eula Listen, MD 12/05/17 1811

## 2017-12-06 DIAGNOSIS — Z23 Encounter for immunization: Secondary | ICD-10-CM | POA: Diagnosis not present

## 2017-12-06 LAB — MRSA PCR SCREENING: MRSA by PCR: NEGATIVE

## 2017-12-06 MED ORDER — HALOPERIDOL LACTATE 5 MG/ML IJ SOLN
2.5000 mg | Freq: Once | INTRAMUSCULAR | Status: AC
  Administered 2017-12-06: 2.5 mg via INTRAVENOUS
  Filled 2017-12-06: qty 0.5

## 2017-12-06 NOTE — Care Management Note (Signed)
Case Management Note  Patient Details  Name: Patricia Foley MRN: [id number] Date of Birth: 03/28/1931   Patient to discharge back to [place] today with home health services.  Sarah with [name] notified of referral.   Subjective/Objective:                    Action/Plan:   Expected Discharge Date:  12/06/17               Expected Discharge Plan:  Assisted Living / Rest Home(with home health )  In-House Referral:     Discharge planning Services  CM Consult  Post Acute Care Choice:  Home Health Choice offered to:  Adult Children  DME Arranged:    DME Agency:     HH Arranged:  RN [place] Agency:  [facility]  Status of Service:  Completed, signed off  If discussed at [place] of Stay Meetings, dates discussed:    Additional Comments:  Beverly Sessions, RN 12/06/2017, 11:12 AM

## 2017-12-06 NOTE — Progress Notes (Signed)
Report called to [place] at [place]. Report and DNR form given to [facility] pts granddaughter.

## 2017-12-06 NOTE — NC FL2 (Signed)
[place] LEVEL OF CARE SCREENING TOOL     IDENTIFICATION  Patient Name: Patricia Foley Birthdate: [birth year] Sex: female Admission Date (Current Location): 12/05/2017  [place] and [place] Number:  Engineering geologist and Address:  [facility], [address], [place], [place] [address]      Provider Number: [id number]  Attending Physician Name and Address:  [name], MD  Relative Name and Phone Number:       Current Level of Care: Hospital Recommended Level of Care: [place], Memory Care Prior Approval Number:    Date Approved/Denied:   PASRR Number:    Discharge Plan: (ALF Memory Care)    Current Diagnoses: Patient Active Problem List   Diagnosis Date Noted  . Bilateral lower extremity edema 12/05/2017  . Community acquired pneumonia 08/24/2017  . (HFpEF) heart failure with preserved ejection fraction ([place]) 08/23/2017  . Acute respiratory failure with hypoxia ([place]) 08/23/2017  . SOB (shortness of breath) 08/10/2017  . Nonhealing nonsurgical wound 05/11/2017  . Callus of foot 05/11/2017  . Urinary frequency 02/10/2017  . COPD (chronic obstructive pulmonary disease) ([place])   . Dementia ([place])   . GERD (gastroesophageal reflux disease)   . Hypertension   . Tremors of nervous system   . Hypothyroidism   . Atrial fibrillation ([place])   . HOH (hard of hearing)   . Anemia   . Arthritis   . Arthritis of ankle, right 05/28/2016  . Essential tremor 03/27/2016  . Chronic insomnia 03/27/2016  . Chronic venous insufficiency 10/04/2015  . Hemorrhoids without complication 84/69/6295  . Late onset Alzheimer's disease without behavioral disturbance ([place]) 03/05/2015  . Bilateral edema of lower extremity 03/05/2015  . Varicose veins of both legs with edema 09/07/2014  . Current tobacco use 10/11/2013    Orientation RESPIRATION BLADDER Height & Weight     Self, Place  Normal Continent Weight: 147 lb 12.8 oz  (67 kg) Height:  4\' 11"  (149.9 cm)  BEHAVIORAL SYMPTOMS/MOOD NEUROLOGICAL BOWEL NUTRITION STATUS  (none) (none) Continent Diet(heart healthy)  AMBULATORY STATUS COMMUNICATION OF NEEDS Skin   Extensive Assist Verbally (bilateral unna boots)                       Personal Care Assistance Level of Assistance  Bathing, Feeding, Dressing Bathing Assistance: Maximum assistance Feeding assistance: Maximum assistance Dressing Assistance: Maximum assistance     Functional Limitations Info  Hearing   Hearing Info: Impaired      SPECIAL CARE FACTORS FREQUENCY                       Contractures Contractures Info: Not present    Additional Factors Info  Code Status Code Status Info: dnr             DISCHARGE MEDICATIONS:        Allergies as of 12/06/2017      Reactions   Sulfa Antibiotics Hives   Coumadin [warfarin] Rash         Medication List    STOP taking these medications   amoxicillin 875 MG tablet Commonly known as:  AMOXIL   doxycycline 100 MG tablet Commonly known as:  VIBRA-TABS   fluticasone 50 MCG/ACT nasal spray Commonly known as:  FLONASE     TAKE these medications   acetaminophen 325 MG tablet Commonly known as:  TYLENOL Take 2 tablets (650 mg total) by mouth every 6 (six) hours as  needed for mild pain (or Fever >/= 101).   albuterol 108 (90 Base) MCG/ACT inhaler Commonly known as:  PROVENTIL HFA;VENTOLIN HFA Inhale 2 puffs into the lungs every 6 (six) hours as needed for wheezing or shortness of breath.   aspirin 81 MG EC tablet TAKE 1 TABLET BY MOUTH EVERY DAY   CALCIUM 600+D 600-200 MG-UNIT Tabs Generic drug:  Calcium Carbonate-Vitamin D Take 1 tablet by mouth daily.   COLACE PO Take 50 mg by mouth at bedtime.   digoxin 0.125 MG tablet Commonly known as:  LANOXIN TAKE 1 TABLET EVERY DAY   diltiazem 300 MG 24 hr capsule Commonly known as:  TIAZAC Take 1 capsule (300 mg total) by mouth daily.    donepezil 10 MG tablet Commonly known as:  ARICEPT Take 20 mg by mouth daily.   ferrous sulfate 325 (65 FE) MG tablet Take 325 mg by mouth daily with breakfast.   furosemide 40 MG tablet Commonly known as:  LASIX Take 40 mg by mouth daily.   ipratropium-albuterol 0.5-2.5 (3) MG/3ML Soln Commonly known as:  DUONEB Take 3 mLs by nebulization every 6 (six) hours as needed.   naproxen sodium 220 MG tablet Commonly known as:  ALEVE Take 220 mg by mouth.   potassium chloride SA 20 MEQ tablet Commonly known as:  K-DUR,KLOR-CON Take 1 tablet (20 mEq total) by mouth daily.   PROCTOSOL HC 2.5 % rectal cream Generic drug:  hydrocortisone Apply 1 application topically 3 (three) times daily.   QUEtiapine 25 MG tablet Commonly known as:  SEROQUEL Take 1 tablet by mouth daily.   triamcinolone cream 0.1 % Commonly known as:  KENALOG Apply 1 application topically 2 (two) times daily. Due to facial picking        Additional Information Home Health Nursing Re: [place], [place]

## 2017-12-06 NOTE — Discharge Summary (Signed)
[place] at [place] NAME: Patricia Foley    MR#:  [id number]  DATE OF BIRTH:  30-May-1931  DATE OF ADMISSION:  12/05/2017 ADMITTING PHYSICIAN: Fritzi Mandes, MD  DATE OF DISCHARGE: 12/06/2017  PRIMARY CARE PHYSICIAN: Virginia Crews, MD    ADMISSION DIAGNOSIS:  Confusion [R41.0] Bilateral cellulitis of lower leg [L03.116, Q00.867]  DISCHARGE DIAGNOSIS:  Active Problems:   Bilateral lower extremity edema   SECONDARY DIAGNOSIS:   Past Medical History:  Diagnosis Date  . Anemia   . Arthritis   . Atrial fibrillation ([place])   . Circulation problem   . COPD (chronic obstructive pulmonary disease) ([place])   . Cough    CHRONIC  . Dementia ([place])   . GERD (gastroesophageal reflux disease)   . HOH (hard of hearing)   . Hypertension   . Hypothyroidism   . Tremors of nervous system   . Venous insufficiency     HOSPITAL COURSE:   Patricia Foley  is a 84 y.o. female with a known history of chronic bilateral lower extremity edema/stasis dermatitis/history of varicose veins/venous stasis, chronic atrial fibrillation not on anticoagulation, dementia, hypothyroidism and in from primary care physician's office for further evaluation of management.  1.  Bilateral lower extremity edema with redness- this is likely secondary to stasis dermatitis with chronic lower extremity edema.  Patient had been treated with multiple rounds of antibiotics and was not improving and had some weeping edema and therefore came to the hospital.  Patient received some intermittent diuretics which has improved the edema. - A wound team consult was obtained and patient has Unna boots placed now.  Patient has no clinical evidence of cellulitis as she is afebrile with a normal white cell count and her skin changes are not consistent with cellulitis but likely stasis dermatitis.  Patient is being discharged back to her assisted living with Unna boots and should have them  changed biweekly.  Will arrange home health nursing for that.  2.  History of atrial fibrillation- rate controlled.  Continue digoxin, Cardizem.  3.  History of dementia-continue Aricept, Seroquel.  4.  History of iron deficiency anemia-continue iron supplements.  5.  Osteoporosis-patient will continue her calcium vitamin D supplements.   DISCHARGE CONDITIONS:   Stable  CONSULTS OBTAINED:    DRUG ALLERGIES:   Allergies  Allergen Reactions  . Sulfa Antibiotics Hives  . Coumadin [Warfarin] Rash    DISCHARGE MEDICATIONS:   Allergies as of 12/06/2017      Reactions   Sulfa Antibiotics Hives   Coumadin [warfarin] Rash      Medication List    STOP taking these medications   amoxicillin 875 MG tablet Commonly known as:  AMOXIL   doxycycline 100 MG tablet Commonly known as:  VIBRA-TABS   fluticasone 50 MCG/ACT nasal spray Commonly known as:  FLONASE     TAKE these medications   acetaminophen 325 MG tablet Commonly known as:  TYLENOL Take 2 tablets (650 mg total) by mouth every 6 (six) hours as needed for mild pain (or Fever >/= 101).   albuterol 108 (90 Base) MCG/ACT inhaler Commonly known as:  PROVENTIL HFA;VENTOLIN HFA Inhale 2 puffs into the lungs every 6 (six) hours as needed for wheezing or shortness of breath.   aspirin 81 MG EC tablet TAKE 1 TABLET BY MOUTH EVERY DAY   CALCIUM 600+D 600-200 MG-UNIT Tabs Generic drug:  Calcium Carbonate-Vitamin D Take 1 tablet by mouth daily.   COLACE PO  Take 50 mg by mouth at bedtime.   digoxin 0.125 MG tablet Commonly known as:  LANOXIN TAKE 1 TABLET EVERY DAY   diltiazem 300 MG 24 hr capsule Commonly known as:  TIAZAC Take 1 capsule (300 mg total) by mouth daily.   donepezil 10 MG tablet Commonly known as:  ARICEPT Take 20 mg by mouth daily.   ferrous sulfate 325 (65 FE) MG tablet Take 325 mg by mouth daily with breakfast.   furosemide 40 MG tablet Commonly known as:  LASIX Take 40 mg by mouth  daily.   ipratropium-albuterol 0.5-2.5 (3) MG/3ML Soln Commonly known as:  DUONEB Take 3 mLs by nebulization every 6 (six) hours as needed.   naproxen sodium 220 MG tablet Commonly known as:  ALEVE Take 220 mg by mouth.   potassium chloride SA 20 MEQ tablet Commonly known as:  K-DUR,KLOR-CON Take 1 tablet (20 mEq total) by mouth daily.   PROCTOSOL HC 2.5 % rectal cream Generic drug:  hydrocortisone Apply 1 application topically 3 (three) times daily.   QUEtiapine 25 MG tablet Commonly known as:  SEROQUEL Take 1 tablet by mouth daily.   triamcinolone cream 0.1 % Commonly known as:  KENALOG Apply 1 application topically 2 (two) times daily. Due to facial picking         DISCHARGE INSTRUCTIONS:   DIET:  Cardiac diet  DISCHARGE CONDITION:  Stable  ACTIVITY:  Activity as tolerated  OXYGEN:  Home Oxygen: No.   Oxygen Delivery: room air  DISCHARGE LOCATION:  Assisted Living with Home Health nursing.    If you experience worsening of your admission symptoms, develop shortness of breath, life threatening emergency, suicidal or homicidal thoughts you must seek medical attention immediately by calling 911 or calling your MD immediately  if symptoms less severe.  You Must read complete instructions/literature along with all the possible adverse reactions/side effects for all the Medicines you take and that have been prescribed to you. Take any new Medicines after you have completely understood and accpet all the possible adverse reactions/side effects.   Please note  You were cared for by a hospitalist during your hospital stay. If you have any questions about your discharge medications or the care you received while you were in the hospital after you are discharged, you can call the unit and asked to speak with the hospitalist on call if the hospitalist that took care of you is not available. Once you are discharged, your primary care physician will handle any further  medical issues. Please note that NO REFILLS for any discharge medications will be authorized once you are discharged, as it is imperative that you return to your primary care physician (or establish a relationship with a primary care physician if you do not have one) for your aftercare needs so that they can reassess your need for medications and monitor your lab values.     Today   Afebrile, hemodynamically stable.  Skin changes on lower extremities are consistent with stasis dermatitis.  Seen by wound team status post Unna boots placement.  Will discharge back to assisted living with home health nursing services.  VITAL SIGNS:  Blood pressure (!) 122/58, pulse 68, temperature 98.1 F (36.7 C), temperature source Oral, resp. rate 18, height 4\' 11"  (1.499 m), weight 67 kg, SpO2 93 %.  I/O:    Intake/Output Summary (Last 24 hours) at 12/06/2017 1154 Last data filed at 12/06/2017 1012 Gross per 24 hour  Intake 480 ml  Output 500 ml  Net -20 ml    PHYSICAL EXAMINATION:  GENERAL:  84 y.o.-year-old patient lying in the bed with no acute distress.  EYES: Pupils equal, round, reactive to light and accommodation. No scleral icterus. Extraocular muscles intact.  HEENT: Head atraumatic, normocephalic. Oropharynx and nasopharynx clear.  NECK:  Supple, no jugular venous distention. No thyroid enlargement, no tenderness.  LUNGS: Normal breath sounds bilaterally, no wheezing, rales,rhonchi. No use of accessory muscles of respiration.  CARDIOVASCULAR: S1, S2 normal. No murmurs, rubs, or gallops.  ABDOMEN: Soft, non-tender, non-distended. Bowel sounds present. No organomegaly or mass.  EXTREMITIES: +1 edema bilaterally, and no clubbing, cyanosis.  Skin changes consistent with stasis dermatitis. NEUROLOGIC: Cranial nerves II through XII are intact. No focal motor or sensory defecits b/l.  Globally weak PSYCHIATRIC: The patient is alert and oriented x 3.  SKIN: No obvious rash, lesion, or ulcer.   Skin changes consistent with stasis dermatitis.  DATA REVIEW:   CBC Recent Labs  Lab 12/05/17 1504  WBC 7.9  HGB 12.2  HCT 38.5  PLT 339    Chemistries  Recent Labs  Lab 12/05/17 1504  NA 139  K 3.7  CL 97*  CO2 33*  GLUCOSE 104*  BUN 12  CREATININE 0.66  CALCIUM 9.7  AST 20  ALT 13  ALKPHOS 93  BILITOT 0.6    Cardiac Enzymes No results for input(s): TROPONINI in the last 168 hours.  Microbiology Results  Results for orders placed or performed during the hospital encounter of 12/05/17  MRSA PCR Screening     Status: None   Collection Time: 12/06/17  7:01 AM  Result Value Ref Range Status   MRSA by PCR NEGATIVE NEGATIVE Final    Comment:        The GeneXpert MRSA Assay (FDA approved for NASAL specimens only), is one component of a comprehensive MRSA colonization surveillance program. It is not intended to diagnose MRSA infection nor to guide or monitor treatment for MRSA infections. Performed at [facility], [address] [place], [place] [address]     RADIOLOGY:  US Venous Img Lower Bilateral  Result Date: 12/05/2017 CLINICAL DATA:  Bilateral lower extremity edema EXAM: BILATERAL LOWER EXTREMITY VENOUS DOPPLER ULTRASOUND TECHNIQUE: Gray-scale sonography with graded compression, as well as color Doppler and duplex ultrasound were performed to evaluate the lower extremity deep venous systems from the level of the common femoral vein and including the common femoral, femoral, profunda femoral, popliteal and calf veins including the posterior tibial, peroneal and gastrocnemius veins when visible. The superficial great saphenous vein was also interrogated. Spectral Doppler was utilized to evaluate flow at rest and with distal augmentation maneuvers in the common femoral, femoral and popliteal veins. COMPARISON:  Left lower extremity study on 01/08/2014 FINDINGS: RIGHT LOWER EXTREMITY Common Femoral Vein: No evidence of thrombus. Normal  compressibility, respiratory phasicity and response to augmentation. Saphenofemoral Junction: No evidence of thrombus. Normal compressibility and flow on color Doppler imaging. Profunda Femoral Vein: No evidence of thrombus. Normal compressibility and flow on color Doppler imaging. Femoral Vein: No evidence of thrombus. Normal compressibility, respiratory phasicity and response to augmentation. Popliteal Vein: No evidence of thrombus. Normal compressibility, respiratory phasicity and response to augmentation. Calf Veins: No evidence of thrombus. Normal compressibility and flow on color Doppler imaging. Superficial Great Saphenous Vein: No evidence of thrombus. Normal compressibility. Venous Reflux:  None. Other Findings: No evidence of superficial thrombophlebitis or abnormal fluid collection. LEFT LOWER EXTREMITY Common Femoral Vein: No evidence of thrombus. Normal compressibility,  respiratory phasicity and response to augmentation. Saphenofemoral Junction: No evidence of thrombus. Normal compressibility and flow on color Doppler imaging. Profunda Femoral Vein: No evidence of thrombus. Normal compressibility and flow on color Doppler imaging. Femoral Vein: No evidence of thrombus. Normal compressibility, respiratory phasicity and response to augmentation. Popliteal Vein: No evidence of thrombus. Normal compressibility, respiratory phasicity and response to augmentation. Calf Veins: No evidence of thrombus. Normal compressibility and flow on color Doppler imaging. Superficial Great Saphenous Vein: No evidence of thrombus. Normal compressibility. Venous Reflux:  None. Other Findings: No evidence of superficial thrombophlebitis or abnormal fluid collection. IMPRESSION: No evidence of bilateral lower extremity deep venous thrombosis. Electronically Signed   By: Aletta Edouard M.D.   On: 12/05/2017 17:51      Management plans discussed with the patient, family and they are in agreement.  CODE STATUS:     Code  Status Orders  (From admission, onward)         Start     Ordered   12/05/17 1814  Do not attempt resuscitation (DNR)  Continuous    Question Answer Comment  In the event of cardiac or respiratory ARREST Do not call a "code blue"   In the event of cardiac or respiratory ARREST Do not perform Intubation, CPR, defibrillation or ACLS   In the event of cardiac or respiratory ARREST Use medication by any route, position, wound care, and other measures to relive pain and suffering. May use oxygen, suction and manual treatment of airway obstruction as needed for comfort.      12/05/17 1813          TOTAL TIME TAKING CARE OF THIS PATIENT: 40 minutes.    [name] M.D on 12/06/2017 at 11:54 AM  Between 7am to 6pm - Pager - [phone]  After 6pm go to [email or web address] - Proofreader  [facility]  Office  [phone]  CC: Primary care physician; Virginia Crews, MD

## 2017-12-06 NOTE — Clinical Social Work Note (Signed)
Clinical Social Work Assessment  Patient Details  Name: Patricia Foley MRN: [id number] Date of Birth: 1931/05/11  Date of referral:  12/06/17               Reason for consult:  Discharge Planning                Permission sought to share information with:    Permission granted to share information::     Name::        Agency::     Relationship::     Contact Information:     Housing/Transportation Living arrangements for the past 2 months:    Source of Information:    Patient Interpreter Needed:  None Criminal Activity/Legal Involvement Pertinent to Current Situation/Hospitalization:  No - Comment as needed Significant Relationships:  Other Family Members Lives with:  Facility Resident Do you feel safe going back to the place where you live?  Yes Need for family participation in patient care:  Yes (Comment)  Care giving concerns:  Patient resides at [facility].   Social Worker assessment / plan:  Patient admitted last evening and discharging today to return to [facility]. CSW notified Patricia Foley at [facility]. Discharge information has been sent. Patient's granddaughter: Patricia Foley is transporting patient according to patient's nurse, Patricia Foley.   Employment status:    Insurance information:    PT Recommendations:    Information / Referral to community resources:     Patient/Family's Response to care:  Patient's granddaughter appreciative of care according to nursing.   Patient/Family's Understanding of and Emotional Response to Diagnosis, Current Treatment, and Prognosis: Patient's granddaughter in agreement with discharge according to nursing.    Emotional Assessment Appearance:    Attitude/Demeanor/Rapport:    Affect (typically observed):    Orientation:  Oriented to Self Alcohol / Substance use:  Not Applicable Psych involvement (Current and /or in the community):  No (Comment)  Discharge Needs  Concerns to be addressed:  Care Coordination Readmission within  the last 30 days:  No Current discharge risk:  None Barriers to Discharge:  No Barriers Identified   Patricia Leff, LCSW 12/06/2017, 12:17 PM

## 2017-12-06 NOTE — Consult Note (Signed)
[facility] Nurse wound consult note Reason for Consult:Venous dermatitis.  Recent cellulitis Has worn removable compression garments in the past.  MD stopped this with last round of cellulitis Wound type:Recent cellulitis  Chronic venous stasis, edema to bilateral lower legs. Pressure Injury POA: NA Measurement: scattered nonintact scabbed lesions Wound WLK:HVFMBBU Drainage (amount, consistency, odor) minimal serosanguinous  No odor Periwound:edema and erythema.  Palpable dorsalis pedis pulse in both feet.  Dressing procedure/placement/frequency:Cleanse bilateral legs with soap and water.  Wrap with zinc layer from below toes to below knee.  Secure with self adherent coban wrap.  Change twice weekly Will not follow at this time.  Please re-consult if needed.  Domenic Moras MSN, RN, FNP-BC CWON Wound, Ostomy, Continence Nurse Pager [phone]

## 2017-12-07 NOTE — Progress Notes (Signed)
Pt discharged per MD order. IV removed. Pt to return to [facility] via granddaughter transporting her. Discharge paperwork reviewed with and given to granddaughter who verbalized understanding and will give it to [facility] staff. DNR form included in packet. Pt taken to car in wheelchair by volunteer staff.

## 2017-12-08 ENCOUNTER — Telehealth: Payer: Self-pay | Admitting: Family Medicine

## 2017-12-08 DIAGNOSIS — G301 Alzheimer's disease with late onset: Secondary | ICD-10-CM | POA: Diagnosis not present

## 2017-12-08 DIAGNOSIS — J449 Chronic obstructive pulmonary disease, unspecified: Secondary | ICD-10-CM | POA: Diagnosis not present

## 2017-12-08 DIAGNOSIS — I5030 Unspecified diastolic (congestive) heart failure: Secondary | ICD-10-CM | POA: Diagnosis not present

## 2017-12-08 DIAGNOSIS — I872 Venous insufficiency (chronic) (peripheral): Secondary | ICD-10-CM | POA: Diagnosis not present

## 2017-12-08 DIAGNOSIS — F0280 Dementia in other diseases classified elsewhere without behavioral disturbance: Secondary | ICD-10-CM | POA: Diagnosis not present

## 2017-12-08 DIAGNOSIS — I482 Chronic atrial fibrillation, unspecified: Secondary | ICD-10-CM | POA: Diagnosis not present

## 2017-12-08 DIAGNOSIS — L989 Disorder of the skin and subcutaneous tissue, unspecified: Secondary | ICD-10-CM | POA: Diagnosis not present

## 2017-12-08 DIAGNOSIS — D509 Iron deficiency anemia, unspecified: Secondary | ICD-10-CM | POA: Diagnosis not present

## 2017-12-08 DIAGNOSIS — I110 Hypertensive heart disease with heart failure: Secondary | ICD-10-CM | POA: Diagnosis not present

## 2017-12-08 NOTE — Telephone Encounter (Signed)
Patricia Foley with [facility] is requesting verbal orders for in home nursing visits for wound care as follows:  Twice a week for 60 days  Please advise. Thanks TNP

## 2017-12-08 NOTE — Telephone Encounter (Signed)
Okay for verbal orders  Brita Romp Dionne Bucy, MD, MPH [facility] 12/08/2017 4:18 PM

## 2017-12-08 NOTE — Telephone Encounter (Signed)
Verbal orders given to [facility] with [facility].

## 2017-12-15 DIAGNOSIS — G301 Alzheimer's disease with late onset: Secondary | ICD-10-CM | POA: Diagnosis not present

## 2017-12-15 DIAGNOSIS — F0280 Dementia in other diseases classified elsewhere without behavioral disturbance: Secondary | ICD-10-CM | POA: Diagnosis not present

## 2017-12-15 DIAGNOSIS — L989 Disorder of the skin and subcutaneous tissue, unspecified: Secondary | ICD-10-CM | POA: Diagnosis not present

## 2017-12-15 DIAGNOSIS — D509 Iron deficiency anemia, unspecified: Secondary | ICD-10-CM | POA: Diagnosis not present

## 2017-12-15 DIAGNOSIS — I5030 Unspecified diastolic (congestive) heart failure: Secondary | ICD-10-CM | POA: Diagnosis not present

## 2017-12-15 DIAGNOSIS — J449 Chronic obstructive pulmonary disease, unspecified: Secondary | ICD-10-CM | POA: Diagnosis not present

## 2017-12-15 DIAGNOSIS — I482 Chronic atrial fibrillation, unspecified: Secondary | ICD-10-CM | POA: Diagnosis not present

## 2017-12-15 DIAGNOSIS — I872 Venous insufficiency (chronic) (peripheral): Secondary | ICD-10-CM | POA: Diagnosis not present

## 2017-12-15 DIAGNOSIS — I110 Hypertensive heart disease with heart failure: Secondary | ICD-10-CM | POA: Diagnosis not present

## 2017-12-19 DIAGNOSIS — I872 Venous insufficiency (chronic) (peripheral): Secondary | ICD-10-CM | POA: Diagnosis not present

## 2017-12-19 DIAGNOSIS — J449 Chronic obstructive pulmonary disease, unspecified: Secondary | ICD-10-CM | POA: Diagnosis not present

## 2017-12-19 DIAGNOSIS — I482 Chronic atrial fibrillation, unspecified: Secondary | ICD-10-CM | POA: Diagnosis not present

## 2017-12-19 DIAGNOSIS — F0280 Dementia in other diseases classified elsewhere without behavioral disturbance: Secondary | ICD-10-CM | POA: Diagnosis not present

## 2017-12-19 DIAGNOSIS — I110 Hypertensive heart disease with heart failure: Secondary | ICD-10-CM | POA: Diagnosis not present

## 2017-12-19 DIAGNOSIS — I5030 Unspecified diastolic (congestive) heart failure: Secondary | ICD-10-CM | POA: Diagnosis not present

## 2017-12-19 DIAGNOSIS — D509 Iron deficiency anemia, unspecified: Secondary | ICD-10-CM | POA: Diagnosis not present

## 2017-12-19 DIAGNOSIS — G301 Alzheimer's disease with late onset: Secondary | ICD-10-CM | POA: Diagnosis not present

## 2017-12-19 DIAGNOSIS — L989 Disorder of the skin and subcutaneous tissue, unspecified: Secondary | ICD-10-CM | POA: Diagnosis not present

## 2017-12-22 ENCOUNTER — Encounter: Payer: Self-pay | Admitting: Family Medicine

## 2017-12-22 ENCOUNTER — Ambulatory Visit (INDEPENDENT_AMBULATORY_CARE_PROVIDER_SITE_OTHER): Payer: Medicare HMO | Admitting: Family Medicine

## 2017-12-22 VITALS — BP 170/71 | HR 61 | Temp 97.6°F | Wt 149.8 lb

## 2017-12-22 DIAGNOSIS — G301 Alzheimer's disease with late onset: Secondary | ICD-10-CM | POA: Diagnosis not present

## 2017-12-22 DIAGNOSIS — D509 Iron deficiency anemia, unspecified: Secondary | ICD-10-CM | POA: Diagnosis not present

## 2017-12-22 DIAGNOSIS — F0281 Dementia in other diseases classified elsewhere with behavioral disturbance: Secondary | ICD-10-CM | POA: Diagnosis not present

## 2017-12-22 DIAGNOSIS — I4891 Unspecified atrial fibrillation: Secondary | ICD-10-CM | POA: Diagnosis not present

## 2017-12-22 DIAGNOSIS — I872 Venous insufficiency (chronic) (peripheral): Secondary | ICD-10-CM | POA: Diagnosis not present

## 2017-12-22 DIAGNOSIS — I110 Hypertensive heart disease with heart failure: Secondary | ICD-10-CM | POA: Diagnosis not present

## 2017-12-22 DIAGNOSIS — H6123 Impacted cerumen, bilateral: Secondary | ICD-10-CM | POA: Diagnosis not present

## 2017-12-22 DIAGNOSIS — H9193 Unspecified hearing loss, bilateral: Secondary | ICD-10-CM

## 2017-12-22 DIAGNOSIS — I10 Essential (primary) hypertension: Secondary | ICD-10-CM

## 2017-12-22 DIAGNOSIS — I482 Chronic atrial fibrillation, unspecified: Secondary | ICD-10-CM | POA: Diagnosis not present

## 2017-12-22 DIAGNOSIS — J449 Chronic obstructive pulmonary disease, unspecified: Secondary | ICD-10-CM

## 2017-12-22 DIAGNOSIS — L989 Disorder of the skin and subcutaneous tissue, unspecified: Secondary | ICD-10-CM | POA: Diagnosis not present

## 2017-12-22 DIAGNOSIS — I480 Paroxysmal atrial fibrillation: Secondary | ICD-10-CM | POA: Diagnosis not present

## 2017-12-22 DIAGNOSIS — F02818 Dementia in other diseases classified elsewhere, unspecified severity, with other behavioral disturbance: Secondary | ICD-10-CM

## 2017-12-22 DIAGNOSIS — I5030 Unspecified diastolic (congestive) heart failure: Secondary | ICD-10-CM | POA: Diagnosis not present

## 2017-12-22 DIAGNOSIS — F0280 Dementia in other diseases classified elsewhere without behavioral disturbance: Secondary | ICD-10-CM | POA: Diagnosis not present

## 2017-12-22 MED ORDER — LISINOPRIL 5 MG PO TABS: 5.0000 mg | ORAL_TABLET | Freq: Every day | ORAL | 3 refills | Status: DC

## 2017-12-22 NOTE — Progress Notes (Signed)
Patient: Patricia Foley Female    DOB: [birth year]   84 y.o.   MRN: [id number] Visit Date: 12/22/2017  Today's Provider: Lavon Paganini, MD   Chief Complaint  Patient presents with  . Hypertension   Subjective:    I, Tiburcio Pea, CMA, am acting as a Education administrator for Lavon Paganini, MD.   HPI  Hypertension, follow-up:     BP Readings from Last 3 Encounters:  09/21/17 138/84  08/23/17 136/62  08/14/17 (!) 157/69    She was last seen for hypertension 3 months ago.  BP at that visit was 138/84. Management since that visit includes discontinuing Lisinopril 5 mg, and monitor BP at SNF daily. Outside blood pressures are being checked at SNF. She is experiencing lower extremity edema and cellulitis Patient denies chest pain, chest pressure/discomfort, dyspnea, irregular heart beat and palpitations.    Wt Readings from Last 3 Encounters:  12/22/17 149 lb 12.8 oz (67.9 kg)  12/05/17 147 lb 12.8 oz (67 kg)  12/05/17 147 lb 12.8 oz (67 kg)    HH RN is wrapping legs twice weekly to help with wound care and chronic venous insufficiency.  [name] is with her and provides majority of history.  He is concerned about decreased hearing lately. ------------------------------------------------------------------------  Allergies  Allergen Reactions  . Sulfa Antibiotics Hives  . Coumadin [Warfarin] Rash     Current Outpatient Medications:  .  acetaminophen (TYLENOL) 325 MG tablet, Take 2 tablets (650 mg total) by mouth every 6 (six) hours as needed for mild pain (or Fever >/= 101)., Disp: 90 tablet, Rfl: 1 .  albuterol (PROVENTIL HFA;VENTOLIN HFA) 108 (90 Base) MCG/ACT inhaler, Inhale 2 puffs into the lungs every 6 (six) hours as needed for wheezing or shortness of breath., Disp: 1 Inhaler, Rfl: 3 .  aspirin 81 MG EC tablet, TAKE 1 TABLET BY MOUTH EVERY DAY, Disp: 90 tablet, Rfl: 1 .  Calcium Carbonate-Vitamin D (CALCIUM 600+D) 600-200 MG-UNIT TABS, Take 1 tablet by  mouth daily., Disp: , Rfl:  .  digoxin (LANOXIN) 0.125 MG tablet, TAKE 1 TABLET EVERY DAY, Disp: 90 tablet, Rfl: 3 .  diltiazem (TIAZAC) 300 MG 24 hr capsule, Take 1 capsule (300 mg total) by mouth daily., Disp: 90 capsule, Rfl: 3 .  Docusate Sodium (COLACE PO), Take 50 mg by mouth at bedtime. , Disp: , Rfl:  .  donepezil (ARICEPT) 10 MG tablet, Take 20 mg by mouth daily. , Disp: , Rfl:  .  ferrous sulfate 325 (65 FE) MG tablet, Take 325 mg by mouth daily with breakfast., Disp: , Rfl:  .  furosemide (LASIX) 40 MG tablet, Take 40 mg by mouth daily. , Disp: , Rfl:  .  hydrocortisone (PROCTOSOL HC) 2.5 % rectal cream, Apply 1 application topically 3 (three) times daily., Disp: , Rfl:  .  ipratropium-albuterol (DUONEB) 0.5-2.5 (3) MG/3ML SOLN, Take 3 mLs by nebulization every 6 (six) hours as needed., Disp: 360 mL, Rfl: 0 .  naproxen sodium (ALEVE) 220 MG tablet, Take 220 mg by mouth., Disp: , Rfl:  .  potassium chloride SA (K-DUR,KLOR-CON) 20 MEQ tablet, Take 1 tablet (20 mEq total) by mouth daily., Disp: 30 tablet, Rfl: 3 .  QUEtiapine (SEROQUEL) 25 MG tablet, Take 1 tablet by mouth daily., Disp: , Rfl:  .  triamcinolone cream (KENALOG) 0.1 %, Apply 1 application topically 2 (two) times daily. Due to facial picking, Disp: 30 g, Rfl: 5  Review of Systems  Constitutional: Negative.  Respiratory: Negative.   Cardiovascular: Negative.   Musculoskeletal: Negative.     Social History   Tobacco Use  . Smoking status: Former Smoker    Packs/day: 1.00    Years: 50.00    Pack years: 50.00    Types: Cigarettes    Last attempt to quit: 04/14/2016    Years since quitting: 1.6  . Smokeless tobacco: Never Used  Substance Use Topics  . Alcohol use: No   Objective:   BP (!) 170/71 (BP Location: Left Arm, Patient Position: Sitting, Cuff Size: Normal)   Pulse 61   Temp 97.6 F (36.4 C) (Oral)   Wt 149 lb 12.8 oz (67.9 kg)   SpO2 96%   BMI 30.26 kg/m  Vitals:   12/22/17 0852  BP: (!) 170/71    Pulse: 61  Temp: 97.6 F (36.4 C)  TempSrc: Oral  SpO2: 96%  Weight: 149 lb 12.8 oz (67.9 kg)     Physical Exam  Constitutional: She is oriented to person, place, and time. She appears well-developed and well-nourished. No distress.  HENT:  Head: Normocephalic and atraumatic.  Right Ear: Tympanic membrane, external ear and ear canal normal.  Mouth/Throat: Oropharynx is clear and moist.  Left ear with significant cerumen impaction  Eyes: Conjunctivae are normal. Right eye exhibits no discharge. Left eye exhibits no discharge. No scleral icterus.  Neck: Neck supple. No thyromegaly present.  Cardiovascular: Normal rate, regular rhythm, normal heart sounds and intact distal pulses.  No murmur heard. Pulmonary/Chest: Effort normal and breath sounds normal. No respiratory distress. She has no wheezes. She has no rales.  Abdominal: Soft. She exhibits no distension and no mass. There is no guarding.  Musculoskeletal: She exhibits edema.  Lymphadenopathy:    She has no cervical adenopathy.  Neurological: She is alert and oriented to person, place, and time.  Skin: Skin is warm and dry. Capillary refill takes less than 2 seconds. No rash noted.  Psychiatric: She has a normal mood and affect. Her behavior is normal.  Vitals reviewed.      Assessment & Plan:   Problem List Items Addressed This Visit      Cardiovascular and Mediastinum   Chronic venous insufficiency    Patient with chronic venous stasis and insufficiency of bilateral legs She has significant skin breakdown due to her chronic venous stasis Unna boots twice weekly they are helping with wound management Keep legs elevated Continue Lasix      Relevant Medications   lisinopril (PRINIVIL,ZESTRIL) 5 MG tablet   Hypertension - Primary    Significantly uncontrolled Off of lisinopril because of previous hypotension We will resume low-dose lisinopril Staff to check blood pressure daily at SNF      Relevant Medications    lisinopril (PRINIVIL,ZESTRIL) 5 MG tablet   Atrial fibrillation (HCC)    In normal sinus rhythm today Continue diltiazem and digoxin      Relevant Medications   lisinopril (PRINIVIL,ZESTRIL) 5 MG tablet     Respiratory   COPD (chronic obstructive pulmonary disease) (HCC)    Stable and well-controlled Not on any controller medications Continue albuterol as needed Continue to avoid cigarettes-patient currently believes that they have stopped making cigarettes        Nervous and Auditory   Dementia ([place])    Fairly advanced Followed by neurology Continue Aricept and Seroquel      HOH (hard of hearing)    Hearing is worsened recently due to cerumen impaction of left ear Patient did not tolerate  irrigation in the office today We could consider ENT referral if this worsens in the future, but it will be a matter of whether she would tolerate this and whether it is an improvement in her quality of life       Other Visit Diagnoses    Bilateral impacted cerumen           Return in about 4 weeks (around 01/19/2018) for BP f/u.   The entirety of the information documented in the History of Present Illness, Review of Systems and Physical Exam were personally obtained by me. Portions of this information were initially documented by Tiburcio Pea, CMA and reviewed by me for thoroughness and accuracy.    Virginia Crews, MD, MPH [facility] 12/23/2017 2:43 PM

## 2017-12-22 NOTE — Patient Instructions (Signed)

## 2017-12-23 NOTE — Assessment & Plan Note (Signed)
Hearing is worsened recently due to cerumen impaction of left ear Patient did not tolerate irrigation in the office today We could consider ENT referral if this worsens in the future, but it will be a matter of whether she would tolerate this and whether it is an improvement in her quality of life

## 2017-12-23 NOTE — Assessment & Plan Note (Signed)
Significantly uncontrolled Off of lisinopril because of previous hypotension We will resume low-dose lisinopril Staff to check blood pressure daily at [facility]

## 2017-12-23 NOTE — Assessment & Plan Note (Signed)
Stable and well-controlled Not on any controller medications Continue albuterol as needed Continue to avoid cigarettes-patient currently believes that they have stopped making cigarettes

## 2017-12-23 NOTE — Assessment & Plan Note (Signed)
Fairly advanced Followed by neurology Continue Aricept and Seroquel

## 2017-12-23 NOTE — Assessment & Plan Note (Signed)
In normal sinus rhythm today Continue diltiazem and digoxin

## 2017-12-23 NOTE — Assessment & Plan Note (Signed)
Patient with chronic venous stasis and insufficiency of bilateral legs She has significant skin breakdown due to her chronic venous stasis Unna boots twice weekly they are helping with wound management Keep legs elevated Continue Lasix

## 2017-12-26 DIAGNOSIS — J449 Chronic obstructive pulmonary disease, unspecified: Secondary | ICD-10-CM | POA: Diagnosis not present

## 2017-12-26 DIAGNOSIS — I482 Chronic atrial fibrillation, unspecified: Secondary | ICD-10-CM | POA: Diagnosis not present

## 2017-12-26 DIAGNOSIS — D509 Iron deficiency anemia, unspecified: Secondary | ICD-10-CM | POA: Diagnosis not present

## 2017-12-26 DIAGNOSIS — I110 Hypertensive heart disease with heart failure: Secondary | ICD-10-CM | POA: Diagnosis not present

## 2017-12-26 DIAGNOSIS — I5030 Unspecified diastolic (congestive) heart failure: Secondary | ICD-10-CM | POA: Diagnosis not present

## 2017-12-26 DIAGNOSIS — I872 Venous insufficiency (chronic) (peripheral): Secondary | ICD-10-CM | POA: Diagnosis not present

## 2017-12-26 DIAGNOSIS — L989 Disorder of the skin and subcutaneous tissue, unspecified: Secondary | ICD-10-CM | POA: Diagnosis not present

## 2017-12-26 DIAGNOSIS — G301 Alzheimer's disease with late onset: Secondary | ICD-10-CM | POA: Diagnosis not present

## 2017-12-26 DIAGNOSIS — F0280 Dementia in other diseases classified elsewhere without behavioral disturbance: Secondary | ICD-10-CM | POA: Diagnosis not present

## 2017-12-29 DIAGNOSIS — J449 Chronic obstructive pulmonary disease, unspecified: Secondary | ICD-10-CM | POA: Diagnosis not present

## 2017-12-29 DIAGNOSIS — G301 Alzheimer's disease with late onset: Secondary | ICD-10-CM | POA: Diagnosis not present

## 2017-12-29 DIAGNOSIS — L989 Disorder of the skin and subcutaneous tissue, unspecified: Secondary | ICD-10-CM | POA: Diagnosis not present

## 2017-12-29 DIAGNOSIS — F0280 Dementia in other diseases classified elsewhere without behavioral disturbance: Secondary | ICD-10-CM | POA: Diagnosis not present

## 2017-12-29 DIAGNOSIS — I110 Hypertensive heart disease with heart failure: Secondary | ICD-10-CM | POA: Diagnosis not present

## 2017-12-29 DIAGNOSIS — I5030 Unspecified diastolic (congestive) heart failure: Secondary | ICD-10-CM | POA: Diagnosis not present

## 2017-12-29 DIAGNOSIS — D509 Iron deficiency anemia, unspecified: Secondary | ICD-10-CM | POA: Diagnosis not present

## 2017-12-29 DIAGNOSIS — I872 Venous insufficiency (chronic) (peripheral): Secondary | ICD-10-CM | POA: Diagnosis not present

## 2017-12-29 DIAGNOSIS — I482 Chronic atrial fibrillation, unspecified: Secondary | ICD-10-CM | POA: Diagnosis not present

## 2018-01-02 DIAGNOSIS — I482 Chronic atrial fibrillation, unspecified: Secondary | ICD-10-CM | POA: Diagnosis not present

## 2018-01-02 DIAGNOSIS — D509 Iron deficiency anemia, unspecified: Secondary | ICD-10-CM | POA: Diagnosis not present

## 2018-01-02 DIAGNOSIS — G301 Alzheimer's disease with late onset: Secondary | ICD-10-CM | POA: Diagnosis not present

## 2018-01-02 DIAGNOSIS — I872 Venous insufficiency (chronic) (peripheral): Secondary | ICD-10-CM | POA: Diagnosis not present

## 2018-01-02 DIAGNOSIS — J449 Chronic obstructive pulmonary disease, unspecified: Secondary | ICD-10-CM | POA: Diagnosis not present

## 2018-01-02 DIAGNOSIS — I110 Hypertensive heart disease with heart failure: Secondary | ICD-10-CM | POA: Diagnosis not present

## 2018-01-02 DIAGNOSIS — F0280 Dementia in other diseases classified elsewhere without behavioral disturbance: Secondary | ICD-10-CM | POA: Diagnosis not present

## 2018-01-02 DIAGNOSIS — L989 Disorder of the skin and subcutaneous tissue, unspecified: Secondary | ICD-10-CM | POA: Diagnosis not present

## 2018-01-02 DIAGNOSIS — I5030 Unspecified diastolic (congestive) heart failure: Secondary | ICD-10-CM | POA: Diagnosis not present

## 2018-01-05 DIAGNOSIS — J449 Chronic obstructive pulmonary disease, unspecified: Secondary | ICD-10-CM | POA: Diagnosis not present

## 2018-01-05 DIAGNOSIS — G301 Alzheimer's disease with late onset: Secondary | ICD-10-CM | POA: Diagnosis not present

## 2018-01-05 DIAGNOSIS — I872 Venous insufficiency (chronic) (peripheral): Secondary | ICD-10-CM | POA: Diagnosis not present

## 2018-01-05 DIAGNOSIS — I5030 Unspecified diastolic (congestive) heart failure: Secondary | ICD-10-CM | POA: Diagnosis not present

## 2018-01-05 DIAGNOSIS — L989 Disorder of the skin and subcutaneous tissue, unspecified: Secondary | ICD-10-CM | POA: Diagnosis not present

## 2018-01-05 DIAGNOSIS — F0280 Dementia in other diseases classified elsewhere without behavioral disturbance: Secondary | ICD-10-CM | POA: Diagnosis not present

## 2018-01-05 DIAGNOSIS — D509 Iron deficiency anemia, unspecified: Secondary | ICD-10-CM | POA: Diagnosis not present

## 2018-01-05 DIAGNOSIS — I110 Hypertensive heart disease with heart failure: Secondary | ICD-10-CM | POA: Diagnosis not present

## 2018-01-05 DIAGNOSIS — I482 Chronic atrial fibrillation, unspecified: Secondary | ICD-10-CM | POA: Diagnosis not present

## 2018-01-09 DIAGNOSIS — I5030 Unspecified diastolic (congestive) heart failure: Secondary | ICD-10-CM | POA: Diagnosis not present

## 2018-01-09 DIAGNOSIS — I482 Chronic atrial fibrillation, unspecified: Secondary | ICD-10-CM | POA: Diagnosis not present

## 2018-01-09 DIAGNOSIS — D509 Iron deficiency anemia, unspecified: Secondary | ICD-10-CM | POA: Diagnosis not present

## 2018-01-09 DIAGNOSIS — L989 Disorder of the skin and subcutaneous tissue, unspecified: Secondary | ICD-10-CM | POA: Diagnosis not present

## 2018-01-09 DIAGNOSIS — F0280 Dementia in other diseases classified elsewhere without behavioral disturbance: Secondary | ICD-10-CM | POA: Diagnosis not present

## 2018-01-09 DIAGNOSIS — I110 Hypertensive heart disease with heart failure: Secondary | ICD-10-CM | POA: Diagnosis not present

## 2018-01-09 DIAGNOSIS — J449 Chronic obstructive pulmonary disease, unspecified: Secondary | ICD-10-CM | POA: Diagnosis not present

## 2018-01-09 DIAGNOSIS — G301 Alzheimer's disease with late onset: Secondary | ICD-10-CM | POA: Diagnosis not present

## 2018-01-09 DIAGNOSIS — I872 Venous insufficiency (chronic) (peripheral): Secondary | ICD-10-CM | POA: Diagnosis not present

## 2018-01-13 DIAGNOSIS — L989 Disorder of the skin and subcutaneous tissue, unspecified: Secondary | ICD-10-CM | POA: Diagnosis not present

## 2018-01-13 DIAGNOSIS — G301 Alzheimer's disease with late onset: Secondary | ICD-10-CM | POA: Diagnosis not present

## 2018-01-13 DIAGNOSIS — D509 Iron deficiency anemia, unspecified: Secondary | ICD-10-CM | POA: Diagnosis not present

## 2018-01-13 DIAGNOSIS — J449 Chronic obstructive pulmonary disease, unspecified: Secondary | ICD-10-CM | POA: Diagnosis not present

## 2018-01-13 DIAGNOSIS — I872 Venous insufficiency (chronic) (peripheral): Secondary | ICD-10-CM | POA: Diagnosis not present

## 2018-01-13 DIAGNOSIS — I110 Hypertensive heart disease with heart failure: Secondary | ICD-10-CM | POA: Diagnosis not present

## 2018-01-13 DIAGNOSIS — I5030 Unspecified diastolic (congestive) heart failure: Secondary | ICD-10-CM | POA: Diagnosis not present

## 2018-01-13 DIAGNOSIS — F0280 Dementia in other diseases classified elsewhere without behavioral disturbance: Secondary | ICD-10-CM | POA: Diagnosis not present

## 2018-01-13 DIAGNOSIS — I482 Chronic atrial fibrillation, unspecified: Secondary | ICD-10-CM | POA: Diagnosis not present

## 2018-01-16 DIAGNOSIS — I5030 Unspecified diastolic (congestive) heart failure: Secondary | ICD-10-CM | POA: Diagnosis not present

## 2018-01-16 DIAGNOSIS — G301 Alzheimer's disease with late onset: Secondary | ICD-10-CM | POA: Diagnosis not present

## 2018-01-16 DIAGNOSIS — I872 Venous insufficiency (chronic) (peripheral): Secondary | ICD-10-CM | POA: Diagnosis not present

## 2018-01-16 DIAGNOSIS — I110 Hypertensive heart disease with heart failure: Secondary | ICD-10-CM | POA: Diagnosis not present

## 2018-01-16 DIAGNOSIS — I482 Chronic atrial fibrillation, unspecified: Secondary | ICD-10-CM | POA: Diagnosis not present

## 2018-01-16 DIAGNOSIS — L989 Disorder of the skin and subcutaneous tissue, unspecified: Secondary | ICD-10-CM | POA: Diagnosis not present

## 2018-01-16 DIAGNOSIS — D509 Iron deficiency anemia, unspecified: Secondary | ICD-10-CM | POA: Diagnosis not present

## 2018-01-16 DIAGNOSIS — F0280 Dementia in other diseases classified elsewhere without behavioral disturbance: Secondary | ICD-10-CM | POA: Diagnosis not present

## 2018-01-16 DIAGNOSIS — J449 Chronic obstructive pulmonary disease, unspecified: Secondary | ICD-10-CM | POA: Diagnosis not present

## 2018-01-19 DIAGNOSIS — F0280 Dementia in other diseases classified elsewhere without behavioral disturbance: Secondary | ICD-10-CM | POA: Diagnosis not present

## 2018-01-19 DIAGNOSIS — I110 Hypertensive heart disease with heart failure: Secondary | ICD-10-CM | POA: Diagnosis not present

## 2018-01-19 DIAGNOSIS — I5030 Unspecified diastolic (congestive) heart failure: Secondary | ICD-10-CM | POA: Diagnosis not present

## 2018-01-19 DIAGNOSIS — G301 Alzheimer's disease with late onset: Secondary | ICD-10-CM | POA: Diagnosis not present

## 2018-01-19 DIAGNOSIS — D509 Iron deficiency anemia, unspecified: Secondary | ICD-10-CM | POA: Diagnosis not present

## 2018-01-19 DIAGNOSIS — L989 Disorder of the skin and subcutaneous tissue, unspecified: Secondary | ICD-10-CM | POA: Diagnosis not present

## 2018-01-19 DIAGNOSIS — I872 Venous insufficiency (chronic) (peripheral): Secondary | ICD-10-CM | POA: Diagnosis not present

## 2018-01-19 DIAGNOSIS — J449 Chronic obstructive pulmonary disease, unspecified: Secondary | ICD-10-CM | POA: Diagnosis not present

## 2018-01-19 DIAGNOSIS — I482 Chronic atrial fibrillation, unspecified: Secondary | ICD-10-CM | POA: Diagnosis not present

## 2018-01-23 DIAGNOSIS — I110 Hypertensive heart disease with heart failure: Secondary | ICD-10-CM | POA: Diagnosis not present

## 2018-01-23 DIAGNOSIS — F0280 Dementia in other diseases classified elsewhere without behavioral disturbance: Secondary | ICD-10-CM | POA: Diagnosis not present

## 2018-01-23 DIAGNOSIS — I5030 Unspecified diastolic (congestive) heart failure: Secondary | ICD-10-CM | POA: Diagnosis not present

## 2018-01-23 DIAGNOSIS — I482 Chronic atrial fibrillation, unspecified: Secondary | ICD-10-CM | POA: Diagnosis not present

## 2018-01-23 DIAGNOSIS — G301 Alzheimer's disease with late onset: Secondary | ICD-10-CM | POA: Diagnosis not present

## 2018-01-23 DIAGNOSIS — L989 Disorder of the skin and subcutaneous tissue, unspecified: Secondary | ICD-10-CM | POA: Diagnosis not present

## 2018-01-23 DIAGNOSIS — D509 Iron deficiency anemia, unspecified: Secondary | ICD-10-CM | POA: Diagnosis not present

## 2018-01-23 DIAGNOSIS — I872 Venous insufficiency (chronic) (peripheral): Secondary | ICD-10-CM | POA: Diagnosis not present

## 2018-01-23 DIAGNOSIS — J449 Chronic obstructive pulmonary disease, unspecified: Secondary | ICD-10-CM | POA: Diagnosis not present

## 2018-01-24 ENCOUNTER — Encounter: Payer: Self-pay | Admitting: Family Medicine

## 2018-01-24 ENCOUNTER — Ambulatory Visit (INDEPENDENT_AMBULATORY_CARE_PROVIDER_SITE_OTHER): Payer: Medicare HMO | Admitting: Family Medicine

## 2018-01-24 VITALS — BP 150/64 | HR 67 | Temp 97.7°F | Wt 150.6 lb

## 2018-01-24 DIAGNOSIS — I10 Essential (primary) hypertension: Secondary | ICD-10-CM

## 2018-01-24 MED ORDER — LISINOPRIL 10 MG PO TABS: 10.0000 mg | ORAL_TABLET | Freq: Every day | ORAL | 5 refills | Status: DC

## 2018-01-24 NOTE — Patient Instructions (Signed)

## 2018-01-24 NOTE — Progress Notes (Signed)
Patient: Patricia Foley Female    DOB: 24-May-1931   84 y.o.   MRN: [id number] Visit Date: 01/24/2018  Today's Provider: Lavon Paganini, MD   Chief Complaint  Patient presents with  . Hypertension   Subjective:    HPI  Hypertension, follow-up:  BP Readings from Last 3 Encounters:  01/24/18 (!) 150/64  12/22/17 (!) 170/71  12/06/17 (!) 136/58   Shewas last seen for hypertension 1 month ago.  BP at that visit was150/64. Management since that visit includes advised toresume Lisinopril 5 mg, and monitor BP at SNF daily. Outside blood pressures arebeing checked at SNF. Sheis experiencing lower extremity edema and cellulitis Patient denieschest pain, chest pressure/discomfort, dyspnea, irregular heart beat and palpitations.     Wt Readings from Last 3 Encounters:  12/22/17 149 lb 12.8 oz (67.9 kg)  12/05/17 147 lb 12.8 oz (67 kg)  12/05/17 147 lb 12.8 oz (67 kg)    Allergies  Allergen Reactions  . Sulfa Antibiotics Hives  . Coumadin [Warfarin] Rash     Current Outpatient Medications:  .  acetaminophen (TYLENOL) 325 MG tablet, Take 2 tablets (650 mg total) by mouth every 6 (six) hours as needed for mild pain (or Fever >/= 101)., Disp: 90 tablet, Rfl: 1 .  albuterol (PROVENTIL HFA;VENTOLIN HFA) 108 (90 Base) MCG/ACT inhaler, Inhale 2 puffs into the lungs every 6 (six) hours as needed for wheezing or shortness of breath., Disp: 1 Inhaler, Rfl: 3 .  aspirin 81 MG EC tablet, TAKE 1 TABLET BY MOUTH EVERY DAY, Disp: 90 tablet, Rfl: 1 .  Calcium Carbonate-Vitamin D (CALCIUM 600+D) 600-200 MG-UNIT TABS, Take 1 tablet by mouth daily., Disp: , Rfl:  .  digoxin (LANOXIN) 0.125 MG tablet, TAKE 1 TABLET EVERY DAY, Disp: 90 tablet, Rfl: 3 .  diltiazem (TIAZAC) 300 MG 24 hr capsule, Take 1 capsule (300 mg total) by mouth daily., Disp: 90 capsule, Rfl: 3 .  Docusate Sodium (COLACE PO), Take 50 mg by mouth at bedtime. , Disp: , Rfl:  .  donepezil (ARICEPT) 10 MG  tablet, Take 20 mg by mouth daily. , Disp: , Rfl:  .  ferrous sulfate 325 (65 FE) MG tablet, Take 325 mg by mouth daily with breakfast., Disp: , Rfl:  .  furosemide (LASIX) 40 MG tablet, Take 40 mg by mouth daily. , Disp: , Rfl:  .  hydrocortisone (PROCTOSOL HC) 2.5 % rectal cream, Apply 1 application topically 3 (three) times daily., Disp: , Rfl:  .  ipratropium-albuterol (DUONEB) 0.5-2.5 (3) MG/3ML SOLN, Take 3 mLs by nebulization every 6 (six) hours as needed., Disp: 360 mL, Rfl: 0 .  lisinopril (PRINIVIL,ZESTRIL) 5 MG tablet, Take 1 tablet (5 mg total) by mouth daily., Disp: 90 tablet, Rfl: 3 .  naproxen sodium (ALEVE) 220 MG tablet, Take 220 mg by mouth., Disp: , Rfl:  .  potassium chloride SA (K-DUR,KLOR-CON) 20 MEQ tablet, Take 1 tablet (20 mEq total) by mouth daily., Disp: 30 tablet, Rfl: 3 .  QUEtiapine (SEROQUEL) 25 MG tablet, Take 1 tablet by mouth daily., Disp: , Rfl:  .  triamcinolone cream (KENALOG) 0.1 %, Apply 1 application topically 2 (two) times daily. Due to facial picking, Disp: 30 g, Rfl: 5  Review of Systems  Constitutional: Negative.   Respiratory: Negative.   Cardiovascular: Negative.   Musculoskeletal: Negative.     Social History   Tobacco Use  . Smoking status: Former Smoker    Packs/day: 1.00    Years:  50.00    Pack years: 50.00    Types: Cigarettes    Last attempt to quit: 04/14/2016    Years since quitting: 1.7  . Smokeless tobacco: Never Used  Substance Use Topics  . Alcohol use: No   Objective:   BP (!) 150/64 (BP Location: Left Arm, Patient Position: Sitting, Cuff Size: Normal)   Pulse 67   Temp 97.7 F (36.5 C) (Oral)   Wt 150 lb 9.6 oz (68.3 kg)   SpO2 95%   BMI 30.42 kg/m  Vitals:   01/24/18 0944  BP: (!) 150/64  Pulse: 67  Temp: 97.7 F (36.5 C)  TempSrc: Oral  SpO2: 95%  Weight: 150 lb 9.6 oz (68.3 kg)     Physical Exam  Constitutional: She appears well-developed and well-nourished. No distress.  HENT:  Head: Normocephalic  and atraumatic.  Mouth/Throat: Oropharynx is clear and moist.  Eyes: Conjunctivae are normal. No scleral icterus.  Neck: Neck supple. No thyromegaly present.  Cardiovascular: Normal rate, regular rhythm, normal heart sounds and intact distal pulses.  No murmur heard. Pulmonary/Chest: Effort normal and breath sounds normal. No respiratory distress. She has no wheezes. She has no rales.  Abdominal: Soft. She exhibits no distension. There is no tenderness.  Musculoskeletal: She exhibits edema (bilaterally, symmetric, decreasing).  Lymphadenopathy:    She has no cervical adenopathy.  Neurological: She is alert.  Skin: Skin is warm and dry. Capillary refill takes less than 2 seconds. No rash noted.  Psychiatric: She has a normal mood and affect. Her behavior is normal.  Vitals reviewed.       Assessment & Plan:   Problem List Items Addressed This Visit      Cardiovascular and Mediastinum   Hypertension - Primary    Uncontrolled but improving Increase lisinopril to 10mg  daily Reviewed recent labs F/u in 2 months      Relevant Medications   lisinopril (PRINIVIL,ZESTRIL) 10 MG tablet       Return in about 2 months (around 03/27/2018).   The entirety of the information documented in the History of Present Illness, Review of Systems and Physical Exam were personally obtained by me. Portions of this information were initially documented by Tiburcio Pea, CMA and reviewed by me for thoroughness and accuracy.    Virginia Crews, MD, MPH [facility] 01/25/2018 5:00 PM

## 2018-01-25 NOTE — Assessment & Plan Note (Signed)
Uncontrolled but improving Increase lisinopril to 10mg  daily Reviewed recent labs F/u in 2 months

## 2018-01-29 ENCOUNTER — Other Ambulatory Visit: Payer: Self-pay | Admitting: Family Medicine

## 2018-03-17 ENCOUNTER — Encounter: Payer: Self-pay | Admitting: Family Medicine

## 2018-03-17 ENCOUNTER — Ambulatory Visit (INDEPENDENT_AMBULATORY_CARE_PROVIDER_SITE_OTHER): Payer: Medicare HMO | Admitting: Family Medicine

## 2018-03-17 VITALS — BP 155/67 | HR 68 | Temp 98.0°F | Wt 147.0 lb

## 2018-03-17 DIAGNOSIS — F0280 Dementia in other diseases classified elsewhere without behavioral disturbance: Secondary | ICD-10-CM | POA: Diagnosis not present

## 2018-03-17 DIAGNOSIS — L03116 Cellulitis of left lower limb: Secondary | ICD-10-CM

## 2018-03-17 DIAGNOSIS — G301 Alzheimer's disease with late onset: Secondary | ICD-10-CM

## 2018-03-17 DIAGNOSIS — D692 Other nonthrombocytopenic purpura: Secondary | ICD-10-CM | POA: Diagnosis not present

## 2018-03-17 DIAGNOSIS — S51811A Laceration without foreign body of right forearm, initial encounter: Secondary | ICD-10-CM

## 2018-03-17 DIAGNOSIS — J441 Chronic obstructive pulmonary disease with (acute) exacerbation: Secondary | ICD-10-CM

## 2018-03-17 DIAGNOSIS — F0281 Dementia in other diseases classified elsewhere with behavioral disturbance: Secondary | ICD-10-CM | POA: Diagnosis not present

## 2018-03-17 DIAGNOSIS — I872 Venous insufficiency (chronic) (peripheral): Secondary | ICD-10-CM

## 2018-03-17 DIAGNOSIS — J449 Chronic obstructive pulmonary disease, unspecified: Secondary | ICD-10-CM | POA: Diagnosis not present

## 2018-03-17 MED ORDER — QUETIAPINE FUMARATE 50 MG PO TABS: 50.0000 mg | ORAL_TABLET | Freq: Two times a day (BID) | ORAL | 5 refills | Status: DC

## 2018-03-17 MED ORDER — DOXYCYCLINE HYCLATE 100 MG PO TABS: 100.0000 mg | ORAL_TABLET | Freq: Two times a day (BID) | ORAL | 0 refills | Status: AC

## 2018-03-17 NOTE — Patient Instructions (Signed)
Chronic Obstructive Pulmonary Disease Chronic obstructive pulmonary disease (COPD) is a long-term (chronic) lung problem. When you have COPD, it is hard for air to get in and out of your lungs. Usually the condition gets worse over time, and your lungs will never return to normal. There are things you can do to keep yourself as healthy as possible.  Your doctor may treat your condition with: ? Medicines. ? Oxygen. ? Lung surgery.  Your doctor may also recommend: ? Rehabilitation. This includes steps to make your body work better. It may involve a team of specialists. ? Quitting smoking, if you smoke. ? Exercise and changes to your diet. ? Comfort measures (palliative care). Follow these instructions at home: Medicines  Take over-the-counter and prescription medicines only as told by your doctor.  Talk to your doctor before taking any cough or allergy medicines. You may need to avoid medicines that cause your lungs to be dry. Lifestyle  If you smoke, stop. Smoking makes the problem worse. If you need help quitting, ask your doctor.  Avoid being around things that make your breathing worse. This may include smoke, chemicals, and fumes.  Stay active, but remember to rest as well.  Learn and use tips on how to relax.  Make sure you get enough sleep. Most adults need at least 7 hours of sleep every night.  Eat healthy foods. Eat smaller meals more often. Rest before meals. Controlled breathing Learn and use tips on how to control your breathing as told by your doctor. Try:  Breathing in (inhaling) through your nose for 1 second. Then, pucker your lips and breath out (exhale) through your lips for 2 seconds.  Putting one hand on your belly (abdomen). Breathe in slowly through your nose for 1 second. Your hand on your belly should move out. Pucker your lips and breathe out slowly through your lips. Your hand on your belly should move in as you breathe out.  Controlled coughing Learn  and use controlled coughing to clear mucus from your lungs. Follow these steps: 1. Lean your head a little forward. 2. Breathe in deeply. 3. Try to hold your breath for 3 seconds. 4. Keep your mouth slightly open while coughing 2 times. 5. Spit any mucus out into a tissue. 6. Rest and do the steps again 1 or 2 times as needed. General instructions  Make sure you get all the shots (vaccines) that your doctor recommends. Ask your doctor about a flu shot and a pneumonia shot.  Use oxygen therapy and pulmonary rehabilitation if told by your doctor. If you need home oxygen therapy, ask your doctor if you should buy a tool to measure your oxygen level (oximeter).  Make a COPD action plan with your doctor. This helps you to know what to do if you feel worse than usual.  Manage any other conditions you have as told by your doctor.  Avoid going outside when it is very hot, cold, or humid.  Avoid people who have a sickness you can catch (contagious).  Keep all follow-up visits as told by your doctor. This is important. Contact a doctor if:  You cough up more mucus than usual.  There is a change in the color or thickness of the mucus.  It is harder to breathe than usual.  Your breathing is faster than usual.  You have trouble sleeping.  You need to use your medicines more often than usual.  You have trouble doing your normal activities such as getting dressed   or walking around the house. Get help right away if:  You have shortness of breath while resting.  You have shortness of breath that stops you from: ? Being able to talk. ? Doing normal activities.  Your chest hurts for longer than 5 minutes.  Your skin color is more blue than usual.  Your pulse oximeter shows that you have low oxygen for longer than 5 minutes.  You have a fever.  You feel too tired to breathe normally. Summary  Chronic obstructive pulmonary disease (COPD) is a long-term lung problem.  The way your  lungs work will never return to normal. Usually the condition gets worse over time. There are things you can do to keep yourself as healthy as possible.  Take over-the-counter and prescription medicines only as told by your doctor.  If you smoke, stop. Smoking makes the problem worse. This information is not intended to replace advice given to you by your health care provider. Make sure you discuss any questions you have with your health care provider. Document Released: 07/21/2007 Document Revised: 03/08/2016 Document Reviewed: 03/08/2016 Elsevier Interactive Patient Education  2019 [employer]  

## 2018-03-17 NOTE — Progress Notes (Signed)
Patient: Patricia Foley Female    DOB: 1932-01-04   85 y.o.   MRN: [id number] Visit Date: 03/17/2018  Today's Provider: Lavon Paganini, MD   Chief Complaint  Patient presents with  . URI   Subjective:    I, Patricia Foley, CMA, am acting as a scribe for Patricia Paganini, MD.   URI   This is a new problem. The current episode started yesterday. The problem has been unchanged. Associated symptoms include congestion, coughing, rhinorrhea and wheezing. She has tried nothing for the symptoms.   Patient also has a skin tear on right forearm. Patient hit her arm on the door frame yesterday.   She has had worsening of of LE swelling and redness.  LLE now has purulent drainage and tenderness.  No fevers.  She is refusing to wear compression stocking and will not elevate legs.  She sleeps in a recliner with her legs hanging down.  Her agitation has been worsening.  She is taking Seroquel daily for this, Rx'd by previous PCP.  Allergies  Allergen Reactions  . Sulfa Antibiotics Hives  . Coumadin [Warfarin] Rash     Current Outpatient Medications:  .  acetaminophen (TYLENOL) 325 MG tablet, Take 2 tablets (650 mg total) by mouth every 6 (six) hours as needed for mild pain (or Fever >/= 101)., Disp: 90 tablet, Rfl: 1 .  albuterol (PROVENTIL HFA;VENTOLIN HFA) 108 (90 Base) MCG/ACT inhaler, Inhale 2 puffs into the lungs every 6 (six) hours as needed for wheezing or shortness of breath., Disp: 1 Inhaler, Rfl: 3 .  aspirin 81 MG EC tablet, TAKE 1 TABLET BY MOUTH EVERY DAY, Disp: 90 tablet, Rfl: 1 .  Calcium Carbonate-Vitamin D (CALCIUM 600+D) 600-200 MG-UNIT TABS, Take 1 tablet by mouth daily., Disp: , Rfl:  .  digoxin (LANOXIN) 0.125 MG tablet, TAKE 1 TABLET EVERY DAY, Disp: 90 tablet, Rfl: 3 .  diltiazem (TIAZAC) 300 MG 24 hr capsule, Take 1 capsule (300 mg total) by mouth daily., Disp: 90 capsule, Rfl: 3 .  Docusate Sodium (COLACE PO), Take 50 mg by mouth at bedtime. , Disp: , Rfl:   .  donepezil (ARICEPT) 10 MG tablet, Take 20 mg by mouth daily. , Disp: , Rfl:  .  ferrous sulfate 325 (65 FE) MG tablet, Take 325 mg by mouth daily with breakfast., Disp: , Rfl:  .  furosemide (LASIX) 40 MG tablet, Take 40 mg by mouth daily. , Disp: , Rfl:  .  hydrocortisone (PROCTOSOL HC) 2.5 % rectal cream, Apply 1 application topically 3 (three) times daily., Disp: , Rfl:  .  ipratropium-albuterol (DUONEB) 0.5-2.5 (3) MG/3ML SOLN, Take 3 mLs by nebulization every 6 (six) hours as needed., Disp: 360 mL, Rfl: 0 .  KLOR-CON M20 20 MEQ tablet, TAKE 1 TABLET BY MOUTH EVERY DAY, Disp: 90 tablet, Rfl: 1 .  lisinopril (PRINIVIL,ZESTRIL) 10 MG tablet, Take 1 tablet (10 mg total) by mouth daily., Disp: 30 tablet, Rfl: 5 .  naproxen sodium (ALEVE) 220 MG tablet, Take 220 mg by mouth., Disp: , Rfl:  .  QUEtiapine (SEROQUEL) 25 MG tablet, Take 1 tablet by mouth daily., Disp: , Rfl:  .  triamcinolone cream (KENALOG) 0.1 %, Apply 1 application topically 2 (two) times daily. Due to facial picking, Disp: 30 g, Rfl: 5  Review of Systems  Constitutional: Negative.   HENT: Positive for congestion and rhinorrhea.   Respiratory: Positive for cough and wheezing.   Cardiovascular: Negative.   Musculoskeletal:  Negative.   Skin:       Skin tear     Social History   Tobacco Use  . Smoking status: Former Smoker    Packs/day: 1.00    Years: 50.00    Pack years: 50.00    Types: Cigarettes    Last attempt to quit: 04/14/2016    Years since quitting: 1.9  . Smokeless tobacco: Never Used  Substance Use Topics  . Alcohol use: No      Objective:   BP (!) 155/67 (BP Location: Left Arm, Patient Position: Sitting, Cuff Size: Normal)   Pulse 68   Temp 98 F (36.7 C) (Oral)   Wt 147 lb (66.7 kg)   SpO2 93%   BMI 29.69 kg/m  Vitals:   03/17/18 1010  BP: (!) 155/67  Pulse: 68  Temp: 98 F (36.7 C)  TempSrc: Oral  SpO2: 93%  Weight: 147 lb (66.7 kg)     Physical Exam Vitals signs reviewed.    Constitutional:      General: She is not in acute distress.    Appearance: Normal appearance. She is not diaphoretic.  HENT:     Head: Normocephalic and atraumatic.     Right Ear: External ear normal.     Left Ear: External ear normal.     Nose: Nose normal.     Mouth/Throat:     Pharynx: Oropharynx is clear.  Eyes:     General: No scleral icterus.    Conjunctiva/sclera: Conjunctivae normal.  Neck:     Musculoskeletal: Neck supple.  Cardiovascular:     Rate and Rhythm: Normal rate and regular rhythm.  Pulmonary:     Effort: Pulmonary effort is normal. No respiratory distress.     Breath sounds: Rhonchi (coarse breath sounds diffusely) present. No wheezing.  Abdominal:     General: There is no distension.     Palpations: Abdomen is soft.     Tenderness: There is no abdominal tenderness.  Musculoskeletal:     Right lower leg: Edema present.     Left lower leg: Edema present.     Comments: Bilateral LE edema with tenderness and purulent discharge of LLE  Lymphadenopathy:     Cervical: No cervical adenopathy.  Skin:    General: Skin is warm and dry.     Capillary Refill: Capillary refill takes less than 2 seconds.     Findings: Abrasion and bruising present. No rash.       Neurological:     Mental Status: She is alert. Mental status is at baseline. She is disoriented.  Psychiatric:        Speech: Speech normal.        Behavior: Behavior is agitated. Behavior is not aggressive.        Cognition and Memory: Cognition is impaired. Memory is impaired.        Assessment & Plan   Problem List Items Addressed This Visit      Cardiovascular and Mediastinum   Chronic venous insufficiency    Chronic with significatn skin breakdown Discussed importance of leg elevation and compression stockings May need to consider Unna boots again in the future Continue lasix        Respiratory   COPD (chronic obstructive pulmonary disease) (HCC) - Primary    Stable Not on controller  meds Continue albuterol and duonebs as needed Given current URI with some wheeze, will write for scheduled duo-nebs for next week Rx for Nebulizer machine written  Relevant Orders   DME Nebulizer machine     Nervous and Auditory   Dementia (Patricia Foley)    Fairly advanced Worsening agitation Will increase Seroquel to 50mg  BID Discussed risk with seroquel in elderly patients with dementia and family agrees to proceed      Relevant Medications   QUEtiapine (SEROQUEL) 50 MG tablet    Other Visit Diagnoses    Cellulitis of left lower extremity  - due to underlying skin breakdown from chronic venous stasis - treat with 7d course of doxycycline - discussed return precautions      Skin tear of right forearm without complication, initial encounter  - dressed in office - discussed keeping clean      Senile purpura ([place])     - related to recent abrasion       Return in about 6 weeks (around 04/28/2018) for chronic disease f/u (move Feb appt).   The entirety of the information documented in the History of Present Illness, Review of Systems and Physical Exam were personally obtained by me. Portions of this information were initially documented by Patricia Foley, CMA and reviewed by me for thoroughness and accuracy.    Virginia Crews, MD, MPH [facility] 03/17/2018 12:51 PM

## 2018-03-17 NOTE — Assessment & Plan Note (Signed)
Fairly advanced Worsening agitation Will increase Seroquel to 50mg  BID Discussed risk with seroquel in elderly patients with dementia and family agrees to proceed

## 2018-03-17 NOTE — Assessment & Plan Note (Signed)
Stable Not on controller meds Continue albuterol and duonebs as needed Given current URI with some wheeze, will write for scheduled duo-nebs for next week Rx for Nebulizer machine written

## 2018-03-17 NOTE — Assessment & Plan Note (Signed)
Chronic with significatn skin breakdown Discussed importance of leg elevation and compression stockings May need to consider Unna boots again in the future Continue lasix

## 2018-03-20 ENCOUNTER — Other Ambulatory Visit: Payer: Self-pay | Admitting: Family Medicine

## 2018-03-20 ENCOUNTER — Telehealth: Payer: Self-pay | Admitting: Family Medicine

## 2018-03-20 DIAGNOSIS — F0281 Dementia in other diseases classified elsewhere with behavioral disturbance: Secondary | ICD-10-CM

## 2018-03-20 DIAGNOSIS — G301 Alzheimer's disease with late onset: Principal | ICD-10-CM

## 2018-03-20 MED ORDER — DONEPEZIL HCL 10 MG PO TABS: 20.0000 mg | ORAL_TABLET | Freq: Every day | ORAL | 0 refills | Status: DC

## 2018-03-20 MED ORDER — FUROSEMIDE 40 MG PO TABS: 40.0000 mg | ORAL_TABLET | Freq: Every day | ORAL | 0 refills | Status: DC

## 2018-03-20 MED ORDER — NAPROXEN SODIUM 220 MG PO TABS: 220.0000 mg | ORAL_TABLET | Freq: Every day | ORAL | 0 refills | Status: DC | PRN

## 2018-03-20 NOTE — Telephone Encounter (Signed)
Sent in for her

## 2018-03-20 NOTE — Telephone Encounter (Signed)
[facility] faxed refill request for the following medications:  furosemide (LASIX) 40 MG tablet  Naproxen 500mg   Please advise.

## 2018-03-20 NOTE — Telephone Encounter (Signed)
Aricept will not be covered by her insurance through mail order, so she needs this called into [facility]. Please call in generic.

## 2018-03-27 ENCOUNTER — Other Ambulatory Visit: Payer: Self-pay | Admitting: Family Medicine

## 2018-03-27 DIAGNOSIS — F0281 Dementia in other diseases classified elsewhere with behavioral disturbance: Secondary | ICD-10-CM

## 2018-03-27 DIAGNOSIS — G301 Alzheimer's disease with late onset: Principal | ICD-10-CM

## 2018-03-27 DIAGNOSIS — F02818 Dementia in other diseases classified elsewhere, unspecified severity, with other behavioral disturbance: Secondary | ICD-10-CM

## 2018-03-27 MED ORDER — FUROSEMIDE 40 MG PO TABS: 40.0000 mg | ORAL_TABLET | Freq: Every day | ORAL | 3 refills | Status: DC

## 2018-03-27 MED ORDER — DONEPEZIL HCL 10 MG PO TABS: 20.0000 mg | ORAL_TABLET | Freq: Every day | ORAL | 3 refills | Status: DC

## 2018-03-27 MED ORDER — NAPROXEN SODIUM 220 MG PO TABS: 220.0000 mg | ORAL_TABLET | Freq: Every day | ORAL | 3 refills | Status: DC | PRN

## 2018-03-27 NOTE — Telephone Encounter (Signed)
[facility] faxed refill request for the following medications:  donepezil (ARICEPT) 10 MG tablet  naproxen sodium (ALEVE) 220 MG tablet  furosemide (LASIX) 40 MG tablet   Please advise.

## 2018-03-28 ENCOUNTER — Other Ambulatory Visit: Payer: Self-pay

## 2018-03-28 ENCOUNTER — Ambulatory Visit: Payer: Medicare HMO | Admitting: Family Medicine

## 2018-03-28 DIAGNOSIS — R4182 Altered mental status, unspecified: Secondary | ICD-10-CM | POA: Diagnosis not present

## 2018-03-28 LAB — POCT URINALYSIS DIPSTICK
Bilirubin, UA: NEGATIVE
Blood, UA: NEGATIVE
Glucose, UA: NEGATIVE
Ketones, UA: NEGATIVE
Nitrite, UA: NEGATIVE
PROTEIN UA: NEGATIVE
Spec Grav, UA: 1.02 (ref 1.010–1.025)
Urobilinogen, UA: 0.2 E.U./dL
pH, UA: 6 (ref 5.0–8.0)

## 2018-03-30 LAB — URINE CULTURE

## 2018-04-08 ENCOUNTER — Other Ambulatory Visit: Payer: Self-pay | Admitting: Family Medicine

## 2018-04-11 ENCOUNTER — Encounter: Payer: Self-pay | Admitting: Family Medicine

## 2018-04-11 ENCOUNTER — Ambulatory Visit (INDEPENDENT_AMBULATORY_CARE_PROVIDER_SITE_OTHER): Payer: Medicare HMO | Admitting: Family Medicine

## 2018-04-11 VITALS — BP 147/68 | HR 69 | Temp 97.7°F | Wt 143.2 lb

## 2018-04-11 DIAGNOSIS — T148XXA Other injury of unspecified body region, initial encounter: Secondary | ICD-10-CM

## 2018-04-11 DIAGNOSIS — I872 Venous insufficiency (chronic) (peripheral): Secondary | ICD-10-CM | POA: Diagnosis not present

## 2018-04-11 DIAGNOSIS — L03116 Cellulitis of left lower limb: Secondary | ICD-10-CM

## 2018-04-11 MED ORDER — DOXYCYCLINE HYCLATE 100 MG PO TABS: 100.0000 mg | ORAL_TABLET | Freq: Two times a day (BID) | ORAL | 0 refills | Status: AC

## 2018-04-11 NOTE — Progress Notes (Signed)
Patient: Patricia Foley Female    DOB: 12/19/1931   85 y.o.   MRN: [id number] Visit Date: 04/12/2018  Today's Provider: Lavon Paganini, MD   Chief Complaint  Patient presents with  . Leg Problem   Subjective:     HPI   Pt reports to the office today for recurrent sore on left leg. Complaints of it being sore. Patient was seen for this on 03/17/2018 (took Doxycycline) and reports that it hasn't cleared up from last visit and the wound is open. More pus is coming from it.  Theyare unable to get patient to cooperate with elevating legs or wearing compression stockings.  Allergies  Allergen Reactions  . Sulfa Antibiotics Hives  . Coumadin [Warfarin] Rash     Current Outpatient Medications:  .  acetaminophen (TYLENOL) 325 MG tablet, Take 2 tablets (650 mg total) by mouth every 6 (six) hours as needed for mild pain (or Fever >/= 101)., Disp: 90 tablet, Rfl: 1 .  albuterol (PROVENTIL HFA;VENTOLIN HFA) 108 (90 Base) MCG/ACT inhaler, Inhale 2 puffs into the lungs every 6 (six) hours as needed for wheezing or shortness of breath., Disp: 1 Inhaler, Rfl: 3 .  aspirin 81 MG EC tablet, TAKE 1 TABLET BY MOUTH EVERY DAY, Disp: 90 tablet, Rfl: 1 .  Calcium Carbonate-Vitamin D (CALCIUM 600+D) 600-200 MG-UNIT TABS, Take 1 tablet by mouth daily., Disp: , Rfl:  .  digoxin (LANOXIN) 0.125 MG tablet, TAKE 1 TABLET EVERY DAY, Disp: 90 tablet, Rfl: 3 .  diltiazem (TIAZAC) 300 MG 24 hr capsule, Take 1 capsule (300 mg total) by mouth daily., Disp: 90 capsule, Rfl: 3 .  Docusate Sodium (COLACE PO), Take 50 mg by mouth at bedtime. , Disp: , Rfl:  .  donepezil (ARICEPT) 10 MG tablet, Take 2 tablets (20 mg total) by mouth daily., Disp: 180 tablet, Rfl: 3 .  ferrous sulfate 325 (65 FE) MG tablet, Take 325 mg by mouth daily with breakfast., Disp: , Rfl:  .  furosemide (LASIX) 40 MG tablet, Take 1 tablet (40 mg total) by mouth daily., Disp: 90 tablet, Rfl: 3 .  hydrocortisone (PROCTOSOL HC) 2.5 % rectal  cream, Apply 1 application topically 3 (three) times daily., Disp: , Rfl:  .  ipratropium-albuterol (DUONEB) 0.5-2.5 (3) MG/3ML SOLN, Take 3 mLs by nebulization every 6 (six) hours as needed., Disp: 360 mL, Rfl: 0 .  KLOR-CON M20 20 MEQ tablet, TAKE 1 TABLET BY MOUTH EVERY DAY, Disp: 90 tablet, Rfl: 1 .  lisinopril (PRINIVIL,ZESTRIL) 10 MG tablet, Take 1 tablet (10 mg total) by mouth daily., Disp: 30 tablet, Rfl: 5 .  naproxen sodium (ALEVE) 220 MG tablet, Take 1 tablet (220 mg total) by mouth daily as needed., Disp: 90 tablet, Rfl: 3 .  QUEtiapine (SEROQUEL) 50 MG tablet, TAKE 1 TABLET BY MOUTH TWICE A DAY, Disp: 180 tablet, Rfl: 2 .  triamcinolone cream (KENALOG) 0.1 %, Apply 1 application topically 2 (two) times daily. Due to facial picking, Disp: 30 g, Rfl: 5 .  doxycycline (VIBRA-TABS) 100 MG tablet, Take 1 tablet (100 mg total) by mouth 2 (two) times daily for 7 days., Disp: 14 tablet, Rfl: 0  Review of Systems  Constitutional: Negative.   HENT: Negative.   Eyes: Negative.   Respiratory: Negative.   Cardiovascular: Positive for leg swelling.  Gastrointestinal: Negative.   Endocrine: Negative.   Genitourinary: Negative.   Musculoskeletal: Negative.   Skin: Positive for wound (left leg).  Allergic/Immunologic: Negative.  Neurological: Negative.   Hematological: Negative.   Psychiatric/Behavioral: Negative.     Social History   Tobacco Use  . Smoking status: Former Smoker    Packs/day: 1.00    Years: 50.00    Pack years: 50.00    Types: Cigarettes    Last attempt to quit: 04/14/2016    Years since quitting: 1.9  . Smokeless tobacco: Never Used  Substance Use Topics  . Alcohol use: No      Objective:   BP (!) 147/68 (BP Location: Left Arm, Patient Position: Sitting, Cuff Size: Normal)   Pulse 69   Temp 97.7 F (36.5 C) (Oral)   Wt 143 lb 3.2 oz (65 kg)   SpO2 94%   BMI 28.92 kg/m  Vitals:   04/11/18 1014  BP: (!) 147/68  Pulse: 69  Temp: 97.7 F (36.5 C)    TempSrc: Oral  SpO2: 94%  Weight: 143 lb 3.2 oz (65 kg)     Physical Exam Vitals signs reviewed.  Constitutional:      General: She is not in acute distress.    Appearance: Normal appearance. She is not diaphoretic.  HENT:     Head: Normocephalic and atraumatic.     Mouth/Throat:     Pharynx: Oropharynx is clear.  Eyes:     General: No scleral icterus.    Conjunctiva/sclera: Conjunctivae normal.  Neck:     Musculoskeletal: Neck supple.  Cardiovascular:     Rate and Rhythm: Normal rate and regular rhythm.     Pulses: Normal pulses.     Heart sounds: Normal heart sounds. No murmur.  Pulmonary:     Effort: Pulmonary effort is normal. No respiratory distress.     Breath sounds: Normal breath sounds. No wheezing or rhonchi.  Abdominal:     General: There is no distension.     Palpations: Abdomen is soft.     Tenderness: There is no abdominal tenderness.  Lymphadenopathy:     Cervical: No cervical adenopathy.  Skin:    General: Skin is warm and dry.     Capillary Refill: Capillary refill takes less than 2 seconds.     Comments: LLE lateral calf with open sore with purulent drainage.  Significant redness and TTP of LLE. RLE with some sores with serous drainage.  Neurological:     Mental Status: She is alert. Mental status is at baseline.         Assessment & Plan   1. Chronic venous insufficiency 2. Nonhealing nonsurgical wound 3. Cellulitis of left lower extremity -Patient with cellulitis of her left lower extremity again -She has had recurrent episodes of this and it is related to her chronic venous stasis and nonhealing open wounds -Given her dementia, it is very difficult to get her to comply with compression stockings and elevation which would help her venous insufficiency and swelling and then would in turn help her heal her wounds -Will treat with doxycycline 7-day course - I have also referred her to wound clinic to see if they can help [place] with Unna boots or  some other method to get these wounds to heal properly and decrease the swelling in her legs -Discussed return precautions with family - Ambulatory referral to [facility] ordered this encounter  Medications  . doxycycline (VIBRA-TABS) 100 MG tablet    Sig: Take 1 tablet (100 mg total) by mouth 2 (two) times daily for 7 days.    Dispense:  14 tablet  Refill:  0     The entirety of the information documented in the History of Present Illness, Review of Systems and Physical Exam were personally obtained by me. Portions of this information were initially documented by Tiburcio Pea, CMA and reviewed by me for thoroughness and accuracy.    Virginia Crews, MD, MPH [facility] 04/12/2018 2:27 PM

## 2018-04-11 NOTE — Patient Instructions (Addendum)
Hydrocortisone cream covered with eucerin twice daily   Debrox drops for ear wax   Cellulitis, Adult  Cellulitis is a skin infection. The infected area is usually warm, red, swollen, and tender. This condition occurs most often in the arms and lower legs. The infection can travel to the muscles, blood, and underlying tissue and become serious. It is very important to get treated for this condition. What are the causes? Cellulitis is caused by bacteria. The bacteria enter through a break in the skin, such as a cut, burn, insect bite, open sore, or crack. What increases the risk? This condition is more likely to occur in people who:  Have a weak body defense system (immune system).  Have open wounds on the skin, such as cuts, burns, bites, and scrapes. Bacteria can enter the body through these open wounds.  Are older than 83 years of age.  Have diabetes.  Have a type of long-lasting (chronic) liver disease (cirrhosis) or kidney disease.  Are obese.  Have a skin condition such as: ? Itchy rash (eczema). ? Slow movement of blood in the veins (venous stasis). ? Fluid buildup below the skin (edema).  Have had radiation therapy.  Use IV drugs. What are the signs or symptoms? Symptoms of this condition include:  Redness, streaking, or spotting on the skin.  Swollen area of the skin.  Tenderness or pain when an area of the skin is touched.  Warm skin.  A fever.  Chills.  Blisters. How is this diagnosed? This condition is diagnosed based on a medical history and physical exam. You may also have tests, including:  Blood tests.  Imaging tests. How is this treated? Treatment for this condition may include:  Medicines, such as antibiotic medicines or medicines to treat allergies (antihistamines).  Supportive care, such as rest and application of cold or warm cloths (compresses) to the skin.  Hospital care, if the condition is severe. The infection usually starts to  get better within 1-2 days of treatment. Follow these instructions at home:  Medicines  Take over-the-counter and prescription medicines only as told by your health care provider.  If you were prescribed an antibiotic medicine, take it as told by your health care provider. Do not stop taking the antibiotic even if you start to feel better. General instructions  Drink enough fluid to keep your urine pale yellow.  Do not touch or rub the infected area.  Raise (elevate) the infected area above the level of your heart while you are sitting or lying down.  Apply warm or cold compresses to the affected area as told by your health care provider.  Keep all follow-up visits as told by your health care provider. This is important. These visits let your health care provider make sure a more serious infection is not developing. Contact a health care provider if:  You have a fever.  Your symptoms do not begin to improve within 1-2 days of starting treatment.  Your bone or joint underneath the infected area becomes painful after the skin has healed.  Your infection returns in the same area or another area.  You notice a swollen bump in the infected area.  You develop new symptoms.  You have a general ill feeling (malaise) with muscle aches and pains. Get help right away if:  Your symptoms get worse.  You feel very sleepy.  You develop vomiting or diarrhea that persists.  You notice red streaks coming from the infected area.  Your red area gets  larger or turns dark in color. These symptoms may represent a serious problem that is an emergency. Do not wait to see if the symptoms will go away. Get medical help right away. Call your local emergency services (911 in the [place] Do not drive yourself to the hospital. Summary  Cellulitis is a skin infection. This condition occurs most often in the arms and lower legs.  Treatment for this condition may include medicines, such as antibiotic  medicines or antihistamines.  Take over-the-counter and prescription medicines only as told by your health care provider. If you were prescribed an antibiotic medicine, do not stop taking the antibiotic even if you start to feel better.  Contact a health care provider if your symptoms do not begin to improve within 1-2 days of starting treatment or your symptoms get worse.  Keep all follow-up visits as told by your health care provider. This is important. These visits let your health care provider make sure that a more serious infection is not developing. This information is not intended to replace advice given to you by your health care provider. Make sure you discuss any questions you have with your health care provider. Document Released: 11/11/2004 Document Revised: 06/23/2017 Document Reviewed: 06/23/2017 Elsevier Interactive Patient Education  2019 [employer].

## 2018-04-13 ENCOUNTER — Ambulatory Visit: Payer: Medicare HMO | Admitting: Physician Assistant

## 2018-04-14 ENCOUNTER — Encounter: Payer: Medicare HMO | Attending: Physician Assistant | Admitting: Physician Assistant

## 2018-04-14 DIAGNOSIS — I482 Chronic atrial fibrillation, unspecified: Secondary | ICD-10-CM | POA: Insufficient documentation

## 2018-04-14 DIAGNOSIS — F0390 Unspecified dementia without behavioral disturbance: Secondary | ICD-10-CM | POA: Diagnosis not present

## 2018-04-14 DIAGNOSIS — L97822 Non-pressure chronic ulcer of other part of left lower leg with fat layer exposed: Secondary | ICD-10-CM | POA: Diagnosis not present

## 2018-04-14 DIAGNOSIS — L97821 Non-pressure chronic ulcer of other part of left lower leg limited to breakdown of skin: Secondary | ICD-10-CM | POA: Insufficient documentation

## 2018-04-14 DIAGNOSIS — I10 Essential (primary) hypertension: Secondary | ICD-10-CM | POA: Insufficient documentation

## 2018-04-14 DIAGNOSIS — L97811 Non-pressure chronic ulcer of other part of right lower leg limited to breakdown of skin: Secondary | ICD-10-CM | POA: Diagnosis not present

## 2018-04-14 DIAGNOSIS — J449 Chronic obstructive pulmonary disease, unspecified: Secondary | ICD-10-CM | POA: Insufficient documentation

## 2018-04-14 DIAGNOSIS — I872 Venous insufficiency (chronic) (peripheral): Secondary | ICD-10-CM | POA: Insufficient documentation

## 2018-04-14 DIAGNOSIS — L97812 Non-pressure chronic ulcer of other part of right lower leg with fat layer exposed: Secondary | ICD-10-CM | POA: Diagnosis not present

## 2018-04-16 NOTE — Progress Notes (Signed)
MONTEZ, STRYKER ([id number]) Visit Report for 04/14/2018 Abuse/Suicide Risk Screen Details Patient Name: Patricia, Foley. Date of Service: 04/14/2018 10:15 AM Medical Record Number: [id number] Patient Account Number: [id number] Date of Birth/Sex: August 27, 1931 (85 y.o. Female) Treating RN: Harold Barban Primary Care Jeptha Hinnenkamp: Lavon Paganini Other Clinician: Referring Silas Sedam: Lavon Paganini Treating Terin Dierolf/Extender: STONE III, HOYT Weeks in Treatment: 0 Abuse/Suicide Risk Screen Items Answer ABUSE/SUICIDE RISK SCREEN: Has anyone close to you tried to hurt or harm you recentlyo No Do you feel uncomfortable with anyone in your familyo No Has anyone forced you do things that you didnot want to doo No Do you have any thoughts of harming yourselfo No Patient displays signs or symptoms of abuse and/or neglect. No Electronic Signature(s) Signed: 04/14/2018 5:19:20 PM By: Harold Barban Entered By: Harold Barban on 04/14/2018 10:47:17 Patricia Foley ([id number]) -------------------------------------------------------------------------------- Activities of Daily Living Details Patient Name: Patricia, Foley. Date of Service: 04/14/2018 10:15 AM Medical Record Number: [id number] Patient Account Number: [id number] Date of Birth/Sex: 29-May-1931 (85 y.o. Female) Treating RN: Harold Barban Primary Care Modestine Scherzinger: Lavon Paganini Other Clinician: Referring Arlethia Basso: Lavon Paganini Treating Colbi Staubs/Extender: Melburn Hake, HOYT Weeks in Treatment: 0 Activities of Daily Living Items Answer Activities of Daily Living (Please select one for each item) Drive Automobile Completely Able Take Medications Need Assistance Use Telephone Not Able Care for Appearance Not Able Use Toilet Not Able Bath / Shower Need Assistance Dress Self Need Assistance Feed Self Completely Able Walk Need Assistance Get In / Out Bed Need Assistance Housework Not Able Prepare Meals Not  Able Handle Money Not Able Shop for Self Not Able Electronic Signature(s) Signed: 04/14/2018 5:19:20 PM By: Harold Barban Entered By: Harold Barban on 04/14/2018 10:47:59 Patricia Foley ([id number]) -------------------------------------------------------------------------------- Education Assessment Details Patient Name: Patricia, Foley. Date of Service: 04/14/2018 10:15 AM Medical Record Number: [id number] Patient Account Number: [id number] Date of Birth/Sex: [birth year] (85 y.o. Female) Treating RN: Harold Barban Primary Care Jakaiya Netherland: Lavon Paganini Other Clinician: Referring Fortino Haag: Lavon Paganini Treating Jaely Silman/Extender: Melburn Hake, HOYT Weeks in Treatment: 0 Primary Learner Assessed: Caregiver Gwen Her Learning Preferences/Education Level/Primary Language Learning Preference: Explanation Highest Education Level: High School Preferred Language: English Cognitive Barrier Assessment/Beliefs Language Barrier: No Translator Needed: No Memory Deficit: No Emotional Barrier: No Cultural/Religious Beliefs Affecting Medical Care: No Physical Barrier Assessment Impaired Vision: No Impaired Hearing: No Decreased Hand dexterity: No Knowledge/Comprehension Assessment Knowledge Level: High Comprehension Level: High Ability to understand written High instructions: Ability to understand verbal High instructions: Motivation Assessment Anxiety Level: Calm Cooperation: Cooperative Education Importance: Acknowledges Need Interest in Health Problems: Asks Questions Perception: Coherent Willingness to Engage in Self- High Management Activities: Readiness to Engage in Self- High Management Activities: Electronic Signature(s) Signed: 04/14/2018 5:19:20 PM By: Harold Barban Entered By: Harold Barban on 04/14/2018 10:48:38 Patricia Foley ([id number]) -------------------------------------------------------------------------------- Fall Risk  Assessment Details Patient Name: Patricia Foley. Date of Service: 04/14/2018 10:15 AM Medical Record Number: [id number] Patient Account Number: [id number] Date of Birth/Sex: 1931-04-17 (85 y.o. Female) Treating RN: Harold Barban Primary Care [place]: Lavon Paganini Other Clinician: Referring Jayliani Wanner: Lavon Paganini Treating Jahyra Sukup/Extender: Melburn Hake, HOYT Weeks in Treatment: 0 Fall Risk Assessment Items Have you had 2 or more falls in the last 12 monthso 0 No Have you had any fall that resulted in injury in the last 12 monthso 0 No FALL RISK ASSESSMENT: History of falling - immediate or within 3 months 0 No Secondary diagnosis 0 No Ambulatory aid None/bed rest/wheelchair/nurse 0 No Crutches/cane/walker 0 No Furniture  0 No IV Access/Saline Lock 0 No Gait/Training Normal/bed rest/immobile 0 No Weak 0 No Impaired 0 No Mental Status Oriented to own ability 0 No Electronic Signature(s) Signed: 04/14/2018 5:19:20 PM By: Harold Barban Entered By: Harold Barban on 04/14/2018 10:49:19 Patricia Foley ([id number]) -------------------------------------------------------------------------------- Foot Assessment Details Patient Name: Patricia, Foley. Date of Service: 04/14/2018 10:15 AM Medical Record Number: [id number] Patient Account Number: [id number] Date of Birth/Sex: 1931/02/20 (85 y.o. Female) Treating RN: Harold Barban Primary Care Jenia Klepper: Lavon Paganini Other Clinician: Referring Kimble Delaurentis: Lavon Paganini Treating Aimar [place]/Extender: Melburn Hake, HOYT Weeks in Treatment: 0 Foot Assessment Items Site Locations + = Sensation present, - = Sensation absent, C = Callus, U = Ulcer R = Redness, W = Warmth, M = Maceration, PU = Pre-ulcerative lesion F = Fissure, S = Swelling, D = Dryness Assessment Right: Left: Other Deformity: No No Prior Foot Ulcer: No No Prior Amputation: No No Charcot Joint: No No Ambulatory Status: Ambulatory Without  Help Gait: Steady Notes N/A due to dimentia Electronic Signature(s) Signed: 04/14/2018 5:19:20 PM By: Harold Barban Entered By: Harold Barban on 04/14/2018 10:52:33 Patricia Foley ([id number]) -------------------------------------------------------------------------------- Nutrition Risk Assessment Details Patient Name: Patricia, Foley. Date of Service: 04/14/2018 10:15 AM Medical Record Number: [id number] Patient Account Number: [id number] Date of Birth/Sex: [birth year] (85 y.o. Female) Treating RN: Harold Barban Primary Care Kiela Shisler: Lavon Paganini Other Clinician: Referring Aksh Swart: Lavon Paganini Treating Velia Pamer/Extender: STONE III, HOYT Weeks in Treatment: 0 Height (in): 58 Weight (lbs): 143.4 Body Mass Index (BMI): 30 Nutrition Risk Assessment Items NUTRITION RISK SCREEN: I have an illness or condition that made me change the kind and/or amount of 0 No food I eat I eat fewer than two meals per day 0 No I eat few fruits and vegetables, or milk products 0 No I have three or more drinks of beer, liquor or wine almost every day 0 No I have tooth or mouth problems that make it hard for me to eat 0 No I don't always have enough money to buy the food I need 0 No I eat alone most of the time 0 No I take three or more different prescribed or over-the-counter drugs a day 1 Yes Without wanting to, I have lost or gained 10 pounds in the last six months 0 No I am not always physically able to shop, cook and/or feed myself 0 No Nutrition Protocols Good Risk Protocol Moderate Risk Protocol Electronic Signature(s) Signed: 04/14/2018 5:19:20 PM By: Harold Barban Entered By: Harold Barban on 04/14/2018 10:49:39

## 2018-04-16 NOTE — Progress Notes (Signed)
Patricia Foley, Patricia Foley ([id number]) Visit Report for 04/14/2018 Chief Complaint Document Details Patient Name: Patricia Foley, Patricia Foley. Date of Service: 04/14/2018 10:15 AM Medical Record Number: [id number] Patient Account Number: [id number] Date of Birth/Sex: 1931/11/25 (85 y.o. Female) Treating RN: Patricia Foley Primary Care Provider: Lavon Foley Other Clinician: Referring Provider: Lavon Foley Treating Provider/Extender: Patricia Foley Weeks in Foley: 0 Information Obtained from: Patient Chief Complaint Bilateral LE ulcers Electronic Signature(s) Signed: 04/14/2018 5:19:05 PM By: Worthy Keeler PA-C Entered By: Worthy Keeler on 04/14/2018 11:35:32 Patricia Foley, Patricia Foley ([id number]) -------------------------------------------------------------------------------- HPI Details Patient Name: Patricia Foley, Patricia Foley. Date of Service: 04/14/2018 10:15 AM Medical Record Number: [id number] Patient Account Number: [id number] Date of Birth/Sex: 11/09/1931 (85 y.o. Female) Treating RN: Patricia Foley Primary Care Provider: Lavon Foley Other Clinician: Referring Provider: Lavon Foley Treating Provider/Extender: Patricia Foley Weeks in Foley: 0 History of Present Illness HPI Description: 04/14/18 patient presents today for initial evaluation our clinic concerning wounds that she has of the bilateral lower extremities. There is a clustered area on the right and one area on the left. This has been questioned to potentially be cellulitis and currently she is on doxycycline as prescribed by her primary care provider. She does live in an assisted living facility. The patient does have significant dementia and is very adamant about what she wants or does not want to do. Her grandson who was with her today takes excellent care of her as far as I'm concerned he is very patient with her. She as an example does have to frequently go to the bathroom to urinate but will not were brief and  therefore during her visit today with 4-5 times to the bathroom during the time that she was here. She also has previously been attempted to prevent scratching bag binder some gloves to where she told her grandson that "she wasn't going to do any yard work" and took them off and threw them away. Nonetheless she also more importantly for her wounds will not keep the wraps on. She tends to push them down or try to enroll them and this tends to cause a tourniquet effect around her lower portion of her leg which can be even more detrimental. She otherwise does have a history of COPD, hypertension, and atrial fibrillation but otherwise has no significant past medical history. No fevers, chills, nausea, or vomiting noted at this time. Electronic Signature(s) Signed: 04/14/2018 5:19:05 PM By: Worthy Keeler PA-C Entered By: Worthy Keeler on 04/14/2018 17:15:23 Patricia Foley ([id number]) -------------------------------------------------------------------------------- Physical Exam Details Patient Name: Patricia Foley, Patricia Foley. Date of Service: 04/14/2018 10:15 AM Medical Record Number: [id number] Patient Account Number: [id number] Date of Birth/Sex: 04-Aug-1931 (85 y.o. Female) Treating RN: Patricia Foley Primary Care Provider: Lavon Foley Other Clinician: Referring Provider: Lavon Foley Treating Provider/Extender: Patricia III, Patricia Foley: 0 Constitutional sitting or standing blood pressure is within target range for patient.. pulse regular and within target range for patient.Marland Kitchen respirations regular, non-labored and within target range for patient.Marland Kitchen temperature within target range for patient.. Well- nourished and well-hydrated in no acute distress. Eyes conjunctiva clear no eyelid edema noted. pupils equal round and reactive to light and accommodation. Ears, Nose, Mouth, and Throat no gross abnormality of ear auricles or external auditory canals. normal hearing noted during  conversation. mucus membranes moist. Respiratory normal breathing without difficulty. clear to auscultation bilaterally. Cardiovascular regular rate and rhythm with normal S1, S2. 1+ dorsalis pedis/posterior tibialis pulses. 2+ pitting edema of the bilateral lower extremities.  Gastrointestinal (GI) soft, non-tender, non-distended, +BS. no ventral hernia noted. Musculoskeletal unsteady while walking. Psychiatric Patient is not able to cooperate in decision making regarding care. Patient has dementia. pleasant and cooperative. Notes Patient's wound bed currently at each location does not appear to be too significant which is good news. With that being said I do think that she would do excellent if we were able to wrap her legs as far as getting this to heal quite effectively and quickly. Not being able to wrap her legs I think we can have a little bit more trouble getting this to heal. Nonetheless I think that wrapping her legs would be an extremely poor idea and that's not gonna be something that we can do at this point. Electronic Signature(s) Signed: 04/14/2018 5:19:05 PM By: Worthy Keeler PA-C Entered By: Worthy Keeler on 04/14/2018 17:16:15 Patricia Foley ([id number]) -------------------------------------------------------------------------------- Physician Orders Details Patient Name: Patricia Foley, Patricia Foley. Date of Service: 04/14/2018 10:15 AM Medical Record Number: [id number] Patient Account Number: [id number] Date of Birth/Sex: Jun 03, 1931 (85 y.o. Female) Treating RN: Patricia Foley Primary Care Provider: Lavon Foley Other Clinician: Referring Provider: Lavon Foley Treating Provider/Extender: Patricia Hake, Bracy Pepper Weeks in Foley: 0 Verbal / Phone Orders: No Diagnosis Coding ICD-10 Coding Code Description I87.2 Venous insufficiency (chronic) (peripheral) L97.821 Non-pressure chronic ulcer of other part of left lower leg limited to breakdown of skin L97.811  Non-pressure chronic ulcer of other part of right lower leg limited to breakdown of skin I10 Essential (primary) hypertension I48.20 Chronic atrial fibrillation, unspecified J44.9 Chronic obstructive pulmonary disease, unspecified Wound Cleansing Wound #1 Right,Lateral Lower Leg o Cleanse wound with mild soap and water Wound #2 Left,Lateral Lower Leg o Cleanse wound with mild soap and water Skin Barriers/Peri-Wound Care Wound #1 Right,Lateral Lower Leg o Other: - Gentamycin Wound #2 Left,Lateral Lower Leg o Other: - Gentamycin Dressing Change Frequency Wound #1 Right,Lateral Lower Leg o Change dressing every day. Wound #2 Left,Lateral Lower Leg o Change dressing every day. Follow-up Appointments Wound #1 Right,Lateral Lower Leg o Return Appointment in 1 week. Wound #2 Left,Lateral Lower Leg o Return Appointment in 1 week. Medications-please add to medication list. Wound #1 Right,Lateral Lower Leg o Other: - Gentamycin Wound #2 Left,Lateral Lower Leg Patricia Foley, Patricia Foley ([id number]) o Other: - Gentamycin Patient Medications Allergies: sulfa, Coumadin Notifications Medication Indication Start End gentamicin 04/14/2018 DOSE 0 - topical 0.1 % cream - cream topical applied daily to the open wound areas until healed Electronic Signature(s) Signed: 04/14/2018 11:49:20 AM By: Worthy Keeler PA-C Entered By: Worthy Keeler on 04/14/2018 11:49:19 Patricia Foley ([id number]) -------------------------------------------------------------------------------- Problem List Details Patient Name: Patricia Foley, Patricia Foley. Date of Service: 04/14/2018 10:15 AM Medical Record Number: [id number] Patient Account Number: [id number] Date of Birth/Sex: 1931-03-12 (85 y.o. Female) Treating RN: Patricia Foley Primary Care Provider: Lavon Foley Other Clinician: Referring Provider: Lavon Foley Treating Provider/Extender: Patricia Hake, Terel Bann Weeks in Foley: 0 Active  Problems ICD-10 Evaluated Encounter Code Description Active Date Today Diagnosis I87.2 Venous insufficiency (chronic) (peripheral) 04/14/2018 No Yes L97.821 Non-pressure chronic ulcer of other part of left lower leg 04/14/2018 No Yes limited to breakdown of skin L97.811 Non-pressure chronic ulcer of other part of right lower leg 04/14/2018 No Yes limited to breakdown of skin I10 Essential (primary) hypertension 04/14/2018 No Yes I48.20 Chronic atrial fibrillation, unspecified 04/14/2018 No Yes J44.9 Chronic obstructive pulmonary disease, unspecified 04/14/2018 No Yes Inactive Problems Resolved Problems Electronic Signature(s) Signed: 04/14/2018 5:19:05 PM By: Worthy Keeler PA-C Entered By:  Worthy Keeler on 04/14/2018 11:33:47 IASIA, FORCIER ([id number]) -------------------------------------------------------------------------------- Progress Note Details Patient Name: Patricia Foley, Patricia Foley. Date of Service: 04/14/2018 10:15 AM Medical Record Number: [id number] Patient Account Number: [id number] Date of Birth/Sex: 1931/12/27 (85 y.o. Female) Treating RN: Patricia Foley Primary Care Provider: Lavon Foley Other Clinician: Referring Provider: Lavon Foley Treating Provider/Extender: Patricia Hake, Kmarion Rawl Weeks in Foley: 0 Subjective Chief Complaint Information obtained from Patient Bilateral LE ulcers History of Present Illness (HPI) 04/14/18 patient presents today for initial evaluation our clinic concerning wounds that she has of the bilateral lower extremities. There is a clustered area on the right and one area on the left. This has been questioned to potentially be cellulitis and currently she is on doxycycline as prescribed by her primary care provider. She does live in an assisted living facility. The patient does have significant dementia and is very adamant about what she wants or does not want to do. Her grandson who was with her today takes excellent care of her as  far as I'm concerned he is very patient with her. She as an example does have to frequently go to the bathroom to urinate but will not were brief and therefore during her visit today with 4-5 times to the bathroom during the time that she was here. She also has previously been attempted to prevent scratching bag binder some gloves to where she told her grandson that "she wasn't going to do any yard work" and took them off and threw them away. Nonetheless she also more importantly for her wounds will not keep the wraps on. She tends to push them down or try to enroll them and this tends to cause a tourniquet effect around her lower portion of her leg which can be even more detrimental. She otherwise does have a history of COPD, hypertension, and atrial fibrillation but otherwise has no significant past medical history. No fevers, chills, nausea, or vomiting noted at this time. Wound History Patient presents with 2 open wounds that have been present for approximately 2 weeks. Patient has been treating wounds in the following manner: hydrcordosone, then lostion. Laboratory tests have not been performed in the last month. Patient reportedly has not tested positive for an antibiotic resistant organism. Patient reportedly has not tested positive for osteomyelitis. Patient reportedly has not had testing performed to evaluate circulation in the legs. Patient experiences the following problems associated with their wounds: swelling. Patient History Unable to Obtain Patient History due to Dementia. Information obtained from [facility]. Allergies sulfa (Reaction: Hives), Coumadin (Reaction: rash) Family History No family history of Cancer, Diabetes, Heart Disease, Hereditary Spherocytosis, Hypertension, Kidney Disease, Lung Disease, Seizures, Stroke, Thyroid Problems, Tuberculosis. Social History Former smoker - ended on 02/16/2016, Marital Status - Widowed, Alcohol Use - Never, Drug Use - No History,  Caffeine Use - Daily - coffee soda. Medical History Eyes Patient has history of Cataracts - surgery NNEOMA, HARRAL ([id number]) Denies history of Glaucoma, Optic Neuritis Ear/Nose/Mouth/Throat Denies history of Chronic sinus problems/congestion, Middle ear problems Hematologic/Lymphatic Denies history of Anemia, Hemophilia, Human Immunodeficiency Virus, Lymphedema, Sickle Cell Disease Respiratory Denies history of Aspiration, Asthma, Chronic Obstructive Pulmonary Disease (COPD), Pneumothorax, Sleep Apnea, Tuberculosis Cardiovascular Patient has history of Arrhythmia - A fib Denies history of Congestive Heart Failure, Coronary Artery Disease, Deep Vein Thrombosis, Hypertension, Hypotension, Myocardial Infarction, Peripheral Arterial Disease, Peripheral Venous Disease, Phlebitis, Vasculitis Gastrointestinal Denies history of Cirrhosis , Colitis, Crohn s, Hepatitis A, Hepatitis B, Hepatitis C Endocrine Denies history of Type I  Diabetes, Type II Diabetes Genitourinary Denies history of End Stage Renal Disease Immunological Denies history of Lupus Erythematosus, Raynaud s, Scleroderma Integumentary (Skin) Denies history of History of Burn, History of pressure wounds Musculoskeletal Denies history of Gout, Rheumatoid Arthritis, Osteoarthritis, Osteomyelitis Neurologic Patient has history of Dementia - 3 years Denies history of Neuropathy, Quadriplegia, Paraplegia, Seizure Disorder Oncologic Denies history of Received Chemotherapy, Received Radiation Psychiatric Denies history of Anorexia/bulimia, Confinement Anxiety Hospitalization/Surgery History - 11/15/2017, [facility], Broncitis. 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 Eyes Complains or has symptoms of Glasses / Contacts. Ear/Nose/Mouth/Throat The patient has no  complaints or symptoms, HOH Hematologic/Lymphatic The patient has no complaints or symptoms. Respiratory The patient has no complaints or symptoms. Cardiovascular Complains or has symptoms of LE edema - bi lateral. Gastrointestinal The patient has no complaints or symptoms. Endocrine The patient has no complaints or symptoms. Genitourinary The patient has no complaints or symptoms. Immunological The patient has no complaints or symptoms. Integumentary (Skin) Patricia Foley, Patricia Foley. ([id number]) Complains or has symptoms of Wounds - 2 lower legs. Denies complaints or symptoms of Bleeding or bruising tendency, Breakdown, Swelling. Musculoskeletal The patient has no complaints or symptoms. Neurologic The patient has no complaints or symptoms. Oncologic The patient has no complaints or symptoms. Psychiatric The patient has no complaints or symptoms. Objective Constitutional sitting or standing blood pressure is within target range for patient.. pulse regular and within target range for patient.Marland Kitchen respirations regular, non-labored and within target range for patient.Marland Kitchen temperature within target range for patient.. Well- nourished and well-hydrated in no acute distress. Vitals Time Taken: 10:21 AM, Height: 58 in, Source: Stated, Weight: 143.4 lbs, Source: Measured, BMI: 30, Temperature: 97.6 F, Pulse: 61 bpm, Respiratory Rate: 16 breaths/min, Blood Pressure: 125/62 mmHg. Eyes conjunctiva clear no eyelid edema noted. pupils equal round and reactive to light and accommodation. Ears, Nose, Mouth, and Throat no gross abnormality of ear auricles or external auditory canals. normal hearing noted during conversation. mucus membranes moist. Respiratory normal breathing without difficulty. clear to auscultation bilaterally. Cardiovascular regular rate and rhythm with normal S1, S2. 1+ dorsalis pedis/posterior tibialis pulses. 2+ pitting edema of the bilateral  lower extremities. Gastrointestinal (GI) soft, non-tender, non-distended, +BS. no ventral hernia noted. Musculoskeletal unsteady while walking. Psychiatric Patient is not able to cooperate in decision making regarding care. Patient has dementia. pleasant and cooperative. General Notes: Patient's wound bed currently at each location does not appear to be too significant which is good news. With that being said I do think that she would do excellent if we were able to wrap her legs as far as getting this to heal quite effectively and quickly. Not being able to wrap her legs I think we can have a little bit more trouble getting this to heal. Nonetheless I think that wrapping her legs would be an extremely poor idea and that's not gonna be something that we can do at this point. MALLOREY, ODONELL. ([id number]) Integumentary (Hair, Skin) Wound #1 status is Open. Original cause of wound was Gradually Appeared. The wound is located on the Right,Lateral Lower Leg. The wound measures 3.5cm length x 4cm width x 0.1cm depth; 10.996cm^2 area and 1.1cm^3 volume. There is Fat Layer (Subcutaneous Tissue) Exposed exposed. There is no tunneling or undermining noted. There is a large amount of serous drainage noted. The wound margin is flat and intact. There is no granulation within the  wound bed. There is a medium (34-66%) amount of necrotic tissue within the wound bed. The periwound skin appearance exhibited: Dry/Scaly, Erythema. The periwound skin appearance did not exhibit: Callus, Crepitus, Excoriation, Induration, Rash, Scarring, Maceration, Atrophie Blanche, Cyanosis, Ecchymosis, Hemosiderin Staining, Mottled, Pallor, Rubor. The surrounding wound skin color is noted with erythema which is circumferential. Periwound temperature was noted as No Abnormality. The periwound has tenderness on palpation. Wound #2 status is Open. Original cause of wound was Gradually Appeared. The wound is located on the  Left,Lateral Lower Leg. The wound measures 0.9cm length x 0.6cm width x 0.1cm depth; 0.424cm^2 area and 0.042cm^3 volume. There is Fat Layer (Subcutaneous Tissue) Exposed exposed. There is no tunneling or undermining noted. There is a large amount of serous drainage noted. The wound margin is flat and intact. There is no granulation within the wound bed. There is a large (67-100%) amount of necrotic tissue within the wound bed including Adherent Slough. The periwound skin appearance exhibited: Dry/Scaly, Erythema. The periwound skin appearance did not exhibit: Callus, Crepitus, Excoriation, Induration, Rash, Scarring, Maceration, Atrophie Blanche, Cyanosis, Ecchymosis, Hemosiderin Staining, Mottled, Pallor, Rubor. The surrounding wound skin color is noted with erythema which is circumferential. Periwound temperature was noted as No Abnormality. The periwound has tenderness on palpation. Assessment Active Problems ICD-10 Venous insufficiency (chronic) (peripheral) Non-pressure chronic ulcer of other part of left lower leg limited to breakdown of skin Non-pressure chronic ulcer of other part of right lower leg limited to breakdown of skin Essential (primary) hypertension Chronic atrial fibrillation, unspecified Chronic obstructive pulmonary disease, unspecified Plan Wound Cleansing: Wound #1 Right,Lateral Lower Leg: Cleanse wound with mild soap and water Wound #2 Left,Lateral Lower Leg: Cleanse wound with mild soap and water Skin Barriers/Peri-Wound Care: Wound #1 Right,Lateral Lower Leg: Other: - Gentamycin Wound #2 Left,Lateral Lower Leg: Other: - Gentamycin Dressing Change Frequency: Wound #1 Right,Lateral Lower Leg: Change dressing every day. Wound #2 Left,Lateral Lower Leg: Patricia Foley, Patricia Foley. ([id number]) Change dressing every day. Follow-up Appointments: Wound #1 Right,Lateral Lower Leg: Return Appointment in 1 week. Wound #2 Left,Lateral Lower Leg: Return Appointment in 1  week. Medications-please add to medication list.: Wound #1 Right,Lateral Lower Leg: Other: - Gentamycin Wound #2 Left,Lateral Lower Leg: Other: - Gentamycin The following medication(s) was prescribed: gentamicin topical 0.1 % cream 0 cream topical applied daily to the open wound areas until healed starting 04/14/2018 My suggestion at this point is gonna be that we go ahead and initiate the above wound care measures utilizing gentamicin cream for the patient. Patient's grandson is in agreement with this plan he is the one that applies the cream and will be able to do this for her. Again I would like to wrap her legs but that's not gonna be possible especially with the way she would push everything down. We will subsequently see were things stand at follow-up. If there's any concerns or questions in the meantime they will contact the office and let us know otherwise will recheck in one week to see how she's doing. Please see above for specific wound care orders. We will see patient for re-evaluation in 1 week(s) here in the clinic. If anything worsens or changes patient will contact our office for additional recommendations. Electronic Signature(s) Signed: 04/14/2018 5:19:05 PM By: Worthy Keeler PA-C Entered By: Worthy Keeler on 04/14/2018 17:17:24 Patricia Foley, Patricia Foley ([id number]) -------------------------------------------------------------------------------- ROS/PFSH Details Patient Name: LEXIS, POTENZA. Date of Service: 04/14/2018 10:15 AM Medical Record Number: [id number] Patient Account Number: [id number] Date of Birth/Sex:  [birth year] (85 y.o. Female) Treating RN: Harold Barban Primary Care Provider: Lavon Foley Other Clinician: Referring Provider: Lavon Foley Treating Provider/Extender: Patricia Hake, Mozel Burdett Weeks in Foley: 0 Unable to Obtain Patient History due to oo Dementia Information Obtained From Other: Tim Grandson Wound History Do you currently have  one or more open woundso Yes How many open wounds do you currently haveo 2 Approximately how long have you had your woundso 2 weeks How have you been treating your wound(s) until nowo hydrcordosone, then lostion Has your wound(s) ever healed and then re-openedo No Have you had any lab work done in the past montho No Have you tested positive for an antibiotic resistant organism (MRSA, VRE)o No Have you tested positive for osteomyelitis (bone infection)o No Have you had any tests for circulation on your legso No Have you had other problems associated with your woundso Swelling Eyes Complaints and Symptoms: Positive for: Glasses / Contacts Medical History: Positive for: Cataracts - surgery Negative for: Glaucoma; Optic Neuritis Cardiovascular Complaints and Symptoms: Positive for: LE edema - bi lateral Medical History: Positive for: Arrhythmia - A fib Negative for: Congestive Heart Failure; Coronary Artery Disease; Deep Vein Thrombosis; Hypertension; Hypotension; Myocardial Infarction; Peripheral Arterial Disease; Peripheral Venous Disease; Phlebitis; Vasculitis Integumentary (Skin) Complaints and Symptoms: Positive for: Wounds - 2 lower legs Negative for: Bleeding or bruising tendency; Breakdown; Swelling Medical History: Negative for: History of Burn; History of pressure wounds Constitutional Symptoms (General Health) JANAA, ACERO. ([id number]) Complaints and Symptoms: No Complaints or Symptoms Ear/Nose/Mouth/Throat Complaints and Symptoms: No Complaints or Symptoms Complaints and Symptoms: Review of System Notes: HOH Medical History: Negative for: Chronic sinus problems/congestion; Middle ear problems Hematologic/Lymphatic Complaints and Symptoms: No Complaints or Symptoms Medical History: Negative for: Anemia; Hemophilia; Human Immunodeficiency Virus; Lymphedema; Sickle Cell Disease Respiratory Complaints and Symptoms: No Complaints or Symptoms Medical  History: Negative for: Aspiration; Asthma; Chronic Obstructive Pulmonary Disease (COPD); Pneumothorax; Sleep Apnea; Tuberculosis Gastrointestinal Complaints and Symptoms: No Complaints or Symptoms Medical History: Negative for: Cirrhosis ; Colitis; Crohnos; Hepatitis A; Hepatitis B; Hepatitis C Endocrine Complaints and Symptoms: No Complaints or Symptoms Medical History: Negative for: Type I Diabetes; Type II Diabetes Genitourinary Complaints and Symptoms: No Complaints or Symptoms Medical History: Negative for: End Stage Renal Disease Immunological Complaints and Symptoms: No Complaints or Symptoms MAYU, RONK. ([id number]) Medical History: Negative for: Lupus Erythematosus; Raynaudos; Scleroderma Musculoskeletal Complaints and Symptoms: No Complaints or Symptoms Medical History: Negative for: Gout; Rheumatoid Arthritis; Osteoarthritis; Osteomyelitis Past Medical History Notes: arthritis in hands Neurologic Complaints and Symptoms: No Complaints or Symptoms Medical History: Positive for: Dementia - 3 years Negative for: Neuropathy; Quadriplegia; Paraplegia; Seizure Disorder Oncologic Complaints and Symptoms: No Complaints or Symptoms Medical History: Negative for: Received Chemotherapy; Received Radiation Psychiatric Complaints and Symptoms: No Complaints or Symptoms Medical History: Negative for: Anorexia/bulimia; Confinement Anxiety HBO Extended History Items Eyes: Cataracts Immunizations Pneumococcal Vaccine: Received Pneumococcal Vaccination: Yes Implantable Devices None Hospitalization / Surgery History Name of Hospital Purpose of Hospitalization/Surgery Date [facility] Broncitis 11/15/2017 Family and Social History Cancer: No; Diabetes: No; Heart Disease: No; Hereditary Spherocytosis: No; Hypertension: No; Kidney Disease: No; Lung Disease: No; Seizures: No; Stroke: No; Thyroid Problems: No; Tuberculosis: No; Former smoker -  ended on 02/16/2016; Marital Status - Widowed; Alcohol Use: Never; Drug Use: No History; Caffeine Use: Daily - coffee soda; Financial Concerns: KINZLIE, HARNEY ([id number]) No; Food, Clothing or Shelter Needs: No; Support System Lacking: No; Transportation Concerns: No; Living Will: Yes; Medical Power of Attorney: Yes Electronic  Signature(s) Signed: 04/14/2018 5:19:05 PM By: Worthy Keeler PA-C Signed: 04/14/2018 5:19:20 PM By: Harold Barban Entered By: Harold Barban on 04/14/2018 10:47:04 Patricia Foley ([id number]) -------------------------------------------------------------------------------- [place] Details Patient Name: MARIONA, SCHOLES. Date of Service: 04/14/2018 Medical Record Number: [id number] Patient Account Number: [id number] Date of Birth/Sex: 02-Jun-1931 (85 y.o. 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 I10 Essential (primary) hypertension I48.20 Chronic atrial fibrillation, unspecified J44.9 Chronic obstructive pulmonary disease, unspecified Facility Procedures CPT4 Code: 64158309 Description: 99214 - WOUND CARE VISIT-LEV 4 EST PT Modifier: Quantity: 1 Physician Procedures CPT4 Code Description: [id number] WC PHYS LEVEL 3 o NEW PT ICD-10 Diagnosis Description I87.2 Venous insufficiency (chronic) (peripheral) L97.821 Non-pressure chronic ulcer of other part of left lower leg li L97.811 Non-pressure chronic ulcer of other part  of right lower leg l I10 Essential (primary) hypertension Modifier: mited to breakdown imited  to breakdown Quantity: 1 of skin of skin Electronic Signature(s) Signed: 04/14/2018 5:19:05 PM By: Worthy Keeler PA-C Entered By: Worthy Keeler on 04/14/2018 17:17:45

## 2018-04-18 NOTE — Progress Notes (Signed)
TAKYRA, CANTRALL ([id number]) Visit Report for 04/14/2018 Allergy List Details Patient Name: Patricia Foley. Date of Service: 04/14/2018 10:15 AM Medical Record Number: [id number] Patient Account Number: [id number] Date of Birth/Sex: 09/29/1931 (85 y.o. Female) Treating RN: Harold Barban Primary Care Meshelle Holness: Lavon Paganini Other Clinician: Referring Thelia Tanksley: Lavon Paganini Treating Merit Maybee/Extender: STONE III, HOYT Weeks in Treatment: 0 Allergies Active Allergies sulfa Reaction: Hives Coumadin Reaction: rash Allergy Notes Electronic Signature(s) Signed: 04/14/2018 5:19:20 PM By: Harold Barban Entered By: Harold Barban on 04/14/2018 10:34:51 [name] ([id number]) -------------------------------------------------------------------------------- Arrival Information Details Patient Name: Patricia Foley. Date of Service: 04/14/2018 10:15 AM Medical Record Number: [id number] Patient Account Number: [id number] Date of Birth/Sex: 04-Apr-1931 (85 y.o. Female) Treating RN: Cornell Barman Primary Care Taylinn Brabant: Lavon Paganini Other Clinician: Referring Arriyah Madej: Lavon Paganini Treating Alvena Kiernan/Extender: Melburn Hake, HOYT Weeks in Treatment: 0 Visit Information Patient Arrived: Ambulatory Arrival Time: 10:16 Accompanied By: grandson Transfer Assistance: None Patient Identification Verified: Yes Secondary Verification Process Completed: Yes Electronic Signature(s) Signed: 04/14/2018 3:07:51 PM By: Lorine Bears RCP, RRT, CHT Entered By: Lorine Bears on 04/14/2018 10:20:12 [name] ([id number]) -------------------------------------------------------------------------------- Clinic Level of Care Assessment Details Patient Name: Patricia Foley. Date of Service: 04/14/2018 10:15 AM Medical Record Number: [id number] Patient Account Number: [id number] Date of Birth/Sex: [birth year] (85 y.o. Female) Treating  RN: Cornell Barman Primary Care Elinda Bunten: Lavon Paganini Other Clinician: Referring Oralia Criger: Lavon Paganini Treating Alexandria Current/Extender: Melburn Hake, HOYT Weeks in Treatment: 0 Clinic Level of Care Assessment Items TOOL 2 Quantity Score []  - Use when only an EandM is performed on the INITIAL visit 0 ASSESSMENTS - Nursing Assessment / Reassessment X - General Physical Exam (combine w/ comprehensive assessment (listed just below) when 1 20 performed on new pt. evals) X- 1 25 Comprehensive Assessment (HX, ROS, Risk Assessments, Wounds Hx, etc.) ASSESSMENTS - Wound and Skin Assessment / Reassessment X - Simple Wound Assessment / Reassessment - one wound 1 5 []  - 0 Complex Wound Assessment / Reassessment - multiple wounds []  - 0 Dermatologic / Skin Assessment (not related to wound area) ASSESSMENTS - Ostomy and/or Continence Assessment and Care []  - Incontinence Assessment and Management 0 []  - 0 Ostomy Care Assessment and Management (repouching, etc.) PROCESS - Coordination of Care X - Simple Patient / Family Education for ongoing care 1 15 []  - 0 Complex (extensive) Patient / Family Education for ongoing care []  - 0 Staff obtains Programmer, systems, Records, Test Results / Process Orders []  - 0 Staff telephones HHA, Nursing Homes / Clarify orders / etc []  - 0 Routine Transfer to another Facility (non-emergent condition) []  - 0 Routine Hospital Admission (non-emergent condition) []  - 0 New Admissions / Biomedical engineer / Ordering NPWT, Apligraf, etc. []  - 0 Emergency Hospital Admission (emergent condition) X- 1 10 Simple Discharge Coordination []  - 0 Complex (extensive) Discharge Coordination PROCESS - Special Needs []  - Pediatric / Minor Patient Management 0 []  - 0 Isolation Patient Management MAYSEN, BONSIGNORE. ([id number]) []  - 0 Hearing / Language / Visual special needs []  - 0 Assessment of Community assistance (transportation, D/C planning, etc.) []  -  0 Additional assistance / Altered mentation []  - 0 Support Surface(s) Assessment (bed, cushion, seat, etc.) INTERVENTIONS - Wound Cleansing / Measurement X - Wound Imaging (photographs - any number of wounds) 1 5 []  - 0 Wound Tracing (instead of photographs) []  - 0 Simple Wound Measurement - one wound X- 2 5 Complex Wound Measurement - multiple wounds []  - 0 Simple Wound Cleansing -  one wound X- 2 5 Complex Wound Cleansing - multiple wounds INTERVENTIONS - Wound Dressings []  - Small Wound Dressing one or multiple wounds 0 X- 2 15 Medium Wound Dressing one or multiple wounds []  - 0 Large Wound Dressing one or multiple wounds []  - 0 Application of Medications - injection INTERVENTIONS - Miscellaneous []  - External ear exam 0 []  - 0 Specimen Collection (cultures, biopsies, blood, body fluids, etc.) []  - 0 Specimen(s) / Culture(s) sent or taken to Lab for analysis []  - 0 Patient Transfer (multiple staff / Civil Service fast streamer / Similar devices) []  - 0 Simple Staple / Suture removal (25 or less) []  - 0 Complex Staple / Suture removal (26 or more) []  - 0 Hypo / Hyperglycemic Management (close monitor of Blood Glucose) []  - 0 Ankle / Brachial Index (ABI) - do not check if billed separately Has the patient been seen at the hospital within the last three years: Yes Total Score: 130 Level Of Care: New/Established - Level 4 Electronic Signature(s) Signed: 04/17/2018 4:00:49 PM By: Gretta Cool, BSN, RN, CWS, Kim RN, BSN Entered By: Gretta Cool, BSN, RN, CWS, Kim on 04/14/2018 11:48:27 [name] ([id number]) -------------------------------------------------------------------------------- Encounter Discharge Information Details Patient Name: Patricia Foley. Date of Service: 04/14/2018 10:15 AM Medical Record Number: [id number] Patient Account Number: [id number] Date of Birth/Sex: [birth year] (85 y.o. Female) Treating RN: Cornell Barman Primary Care Damonie Ellenwood: Lavon Paganini Other  Clinician: Referring Bobby Barton: Lavon Paganini Treating Alyra Patty/Extender: Melburn Hake, HOYT Weeks in Treatment: 0 Encounter Discharge Information Items Discharge Condition: Stable Ambulatory Status: Ambulatory Discharge Destination: Home Transportation: Other Accompanied By: grandson Schedule Follow-up Appointment: Yes Clinical Summary of Care: Electronic Signature(s) Signed: 04/17/2018 4:00:49 PM By: Gretta Cool, BSN, RN, CWS, Kim RN, BSN Entered By: Gretta Cool, BSN, RN, CWS, Kim on 04/14/2018 11:49:36 [name] ([id number]) -------------------------------------------------------------------------------- General Visit Notes Details Patient Name: ENVI, EAGLESON. Date of Service: 04/14/2018 10:15 AM Medical Record Number: [id number] Patient Account Number: [id number] Date of Birth/Sex: [birth year] (85 y.o. Female) Treating RN: Cornell Barman Primary Care Iveth Heidemann: Lavon Paganini Other Clinician: Referring Karl Knarr: Lavon Paganini Treating Venesha Petraitis/Extender: Melburn Hake, HOYT Weeks in Treatment: 0 Notes Patient has dementia. She is very pleasant, but will not allow any dressings to be applied. Electronic Signature(s) Signed: 04/17/2018 4:00:49 PM By: Gretta Cool, BSN, RN, CWS, Kim RN, BSN Entered By: Gretta Cool, BSN, RN, CWS, Kim on 04/14/2018 11:50:51 SANIYYAH, ELSTER ([id number]) -------------------------------------------------------------------------------- Lower Extremity Assessment Details Patient Name: MCKENSEY, BERGHUIS. Date of Service: 04/14/2018 10:15 AM Medical Record Number: [id number] Patient Account Number: [id number] Date of Birth/Sex: 1931/07/30 (85 y.o. Female) Treating RN: Harold Barban Primary Care Daimien Patmon: Lavon Paganini Other Clinician: Referring Mead Slane: Lavon Paganini Treating Shereta Crothers/Extender: STONE III, HOYT Weeks in Treatment: 0 Edema Assessment Assessed: [Left: No] [Right: No] [Left: Edema] [Right: :] Calf Left: Right: Point of  Measurement: 31 cm From Medial Instep 37 cm 34.5 cm Ankle Left: Right: Point of Measurement: 12 cm From Medial Instep 25.5 cm 23.4 cm Vascular Assessment Pulses: Dorsalis Pedis Palpable: [Left:No] [Right:No] Posterior Tibial Palpable: [Left:Yes] [Right:Yes] Extremity colors, hair growth, and conditions: Extremity Color: [Left:Red] [Right:Red] Hair Growth on Extremity: [Left:Yes] [Right:Yes] Temperature of Extremity: [Left:Warm] [Right:Warm] Capillary Refill: [Left:< 3 seconds] [Right:< 3 seconds] Blood Pressure: Brachial: [Left:140] Dorsalis Pedis: 110 [Left:Dorsalis Pedis: 110] Ankle: Posterior Tibial: [Left:Posterior Tibial: 130 0.79] [Right:0.93] Toe Nail Assessment Left: Right: Thick: Yes Yes Discolored: Yes Yes Deformed: Yes Yes Improper Length and Hygiene: Yes Yes Electronic Signature(s) Signed: 04/14/2018 5:19:20 PM By: Harold Barban Entered By: Harold Barban  on 04/14/2018 11:05:49 YAMILE, ROEDL ([id number]) -------------------------------------------------------------------------------- Multi Wound Chart Details Patient Name: CAMYLLE, WHICKER. Date of Service: 04/14/2018 10:15 AM Medical Record Number: [id number] Patient Account Number: [id number] Date of Birth/Sex: 02-11-1932 (85 y.o. Female) Treating RN: Cornell Barman Primary Care Isobel Eisenhuth: Lavon Paganini Other Clinician: Referring Zykerria Tanton: Lavon Paganini Treating Tkai Large/Extender: Melburn Hake, HOYT Weeks in Treatment: 0 Vital Signs Height(in): 58 Pulse(bpm): 61 Weight(lbs): 143.4 Blood Pressure(mmHg): 125/62 Body Mass Index(BMI): 30 Temperature(F): 97.6 Respiratory Rate 16 (breaths/min): Photos: [1:No Photos] [2:No Photos] [N/A:N/A] Wound Location: [1:Right Lower Leg - Lateral] [2:Left Lower Leg - Lateral] [N/A:N/A] Wounding Event: [1:Gradually Appeared] [2:Gradually Appeared] [N/A:N/A] Primary Etiology: [1:Venous Leg Ulcer] [2:Venous Leg Ulcer] [N/A:N/A] Comorbid History:  [1:Cataracts, Arrhythmia, Dementia] [2:Cataracts, Arrhythmia, Dementia] [N/A:N/A] Date Acquired: [1:04/07/2018] [2:04/07/2018] [N/A:N/A] Weeks of Treatment: [1:0] [2:0] [N/A:N/A] Wound Status: [1:Open] [2:Open] [N/A:N/A] Measurements L x W x D [1:3.5x4x0.1] [2:0.9x0.6x0.1] [N/A:N/A] (cm) Area (cm) : [1:10.996] [2:0.424] [N/A:N/A] Volume (cm) : [1:1.1] [2:0.042] [N/A:N/A] Classification: [1:Partial Thickness] [2:Partial Thickness] [N/A:N/A] Exudate Amount: [1:Large] [2:Large] [N/A:N/A] Exudate Type: [1:Serous] [2:Serous] [N/A:N/A] Exudate Color: [1:amber] [2:amber] [N/A:N/A] Wound Margin: [1:Flat and Intact] [2:Flat and Intact] [N/A:N/A] Granulation Amount: [1:None Present (0%)] [2:None Present (0%)] [N/A:N/A] Necrotic Amount: [1:Medium (34-66%)] [2:Large (67-100%)] [N/A:N/A] Exposed Structures: [1:Fat Layer (Subcutaneous Tissue) Exposed: Yes Fascia: No Tendon: No Muscle: No Joint: No Bone: No] [2:Fat Layer (Subcutaneous Tissue) Exposed: Yes Fascia: No Tendon: No Muscle: No Joint: No Bone: No] [N/A:N/A] Epithelialization: [1:None] [2:None] [N/A:N/A] Periwound Skin Texture: [1:Excoriation: No Induration: No Callus: No Crepitus: No Rash: No Scarring: No] [2:Excoriation: No Induration: No Callus: No Crepitus: No Rash: No Scarring: No] [N/A:N/A] Periwound Skin Moisture: [1:Dry/Scaly: Yes Maceration: No] [2:Dry/Scaly: Yes Maceration: No] [N/A:N/A] Periwound Skin Color: [N/A:N/A] Erythema: Yes Erythema: Yes Atrophie Blanche: No Atrophie Blanche: No Cyanosis: No Cyanosis: No Ecchymosis: No Ecchymosis: No Hemosiderin Staining: No Hemosiderin Staining: No Mottled: No Mottled: No Pallor: No Pallor: No Rubor: No Rubor: No Erythema Location: Circumferential Circumferential N/A Temperature: No Abnormality No Abnormality N/A Tenderness on Palpation: Yes Yes N/A Wound Preparation: Ulcer Cleansing: Ulcer Cleansing: N/A Rinsed/Irrigated with Saline Rinsed/Irrigated with Saline Topical  Anesthetic Applied: Topical Anesthetic Applied: Other: lidocaine 4% Other: lidocaine 4% Treatment Notes Electronic Signature(s) Signed: 04/17/2018 4:00:49 PM By: Gretta Cool, BSN, RN, CWS, Kim RN, BSN Entered By: Gretta Cool, BSN, RN, CWS, Kim on 04/14/2018 11:43:40 [name] ([id number]) -------------------------------------------------------------------------------- [place] Details Patient Name: ALLISA, EINSPAHR. Date of Service: 04/14/2018 10:15 AM Medical Record Number: [id number] Patient Account Number: [id number] Date of Birth/Sex: [birth year] (85 y.o. Female) Treating RN: Cornell Barman Primary Care Odeal Welden: Lavon Paganini Other Clinician: Referring Andrews Tener: Lavon Paganini Treating Murriel Holwerda/Extender: Melburn Hake, HOYT Weeks in Treatment: 0 Active Inactive Abuse / Safety / Falls / Self Care Management Nursing Diagnoses: Impaired physical mobility Self care deficit: actual or potential Goals: Patient/caregiver will verbalize understanding of skin care regimen Date Initiated: 04/14/2018 Target Resolution Date: 04/21/2018 Goal Status: Active Interventions: Provide education on basic hygiene Notes: Venous Leg Ulcer Nursing Diagnoses: Potential for venous Insuffiency (use before diagnosis confirmed) Goals: Patient will maintain optimal edema control Date Initiated: 04/14/2018 Target Resolution Date: 04/21/2018 Goal Status: Active Interventions: Assess peripheral edema status every visit. Notes: Wound/Skin Impairment Nursing Diagnoses: Impaired tissue integrity Goals: Patient/caregiver will verbalize understanding of skin care regimen Date Initiated: 04/14/2018 Target Resolution Date: 04/21/2018 Goal Status: Active Interventions: MAKINZY, CLEERE ([id number]) Provide education on ulcer and skin care Treatment Activities: Skin care regimen initiated : 04/14/2018 Topical wound management initiated : 04/14/2018 Notes: Electronic Signature(s)  Signed:  04/17/2018 4:00:49 PM By: Gretta Cool, BSN, RN, CWS, Kim RN, BSN Entered By: Gretta Cool, BSN, RN, CWS, Kim on 04/14/2018 11:43:22 JKAYLA, SPIEWAK ([id number]) -------------------------------------------------------------------------------- Pain Assessment Details Patient Name: SHEREE, LALLA. Date of Service: 04/14/2018 10:15 AM Medical Record Number: [id number] Patient Account Number: [id number] Date of Birth/Sex: 05-May-1931 (85 y.o. Female) Treating RN: Cornell Barman Primary Care Tashay Bozich: Lavon Paganini Other Clinician: Referring Tonnette Zwiebel: Lavon Paganini Treating Danell Verno/Extender: Melburn Hake, HOYT Weeks in Treatment: 0 Active Problems Location of Pain Severity and Description of Pain Patient Has Paino No Site Locations Pain Management and Medication Current Pain Management: Electronic Signature(s) Signed: 04/14/2018 3:07:51 PM By: Lorine Bears RCP, RRT, CHT Signed: 04/17/2018 4:00:49 PM By: Gretta Cool, BSN, RN, CWS, Kim RN, BSN Entered By: Lorine Bears on 04/14/2018 10:21:09 [name] ([id number]) -------------------------------------------------------------------------------- Patient/Caregiver Education Details Patient Name: HARSHITA, BERNALES. Date of Service: 04/14/2018 10:15 AM Medical Record Number: [id number] Patient Account Number: [id number] Date of Birth/Gender: [birth year] (85 y.o. Female) Treating RN: Cornell Barman Primary Care Physician: Lavon Paganini Other Clinician: Referring Physician: Lavon Paganini Treating Physician/Extender: Sharalyn Ink in Treatment: 0 Education Assessment Education Provided To: Patient Education Topics Provided Wound/Skin Impairment: Handouts: Caring for Your Ulcer Methods: Demonstration, Explain/Verbal Responses: State content correctly Electronic Signature(s) Signed: 04/17/2018 4:00:49 PM By: Gretta Cool, BSN, RN, CWS, Kim RN, BSN Entered By: Gretta Cool, BSN, RN, CWS, Kim on 04/14/2018  11:49:10 [name] ([id number]) -------------------------------------------------------------------------------- Wound Assessment Details Patient Name: CHARLEIGH, CORRENTI. Date of Service: 04/14/2018 10:15 AM Medical Record Number: [id number] Patient Account Number: [id number] Date of Birth/Sex: [birth year] (85 y.o. Female) Treating RN: Harold Barban Primary Care Ziyanna Tolin: Lavon Paganini Other Clinician: Referring Agnes Probert: Lavon Paganini Treating Marjory Meints/Extender: STONE III, HOYT Weeks in Treatment: 0 Wound Status Wound Number: 1 Primary Etiology: Venous Leg Ulcer Wound Location: Right Lower Leg - Lateral Wound Status: Open Wounding Event: Gradually Appeared Comorbid History: Cataracts, Arrhythmia, Dementia Date Acquired: 04/07/2018 Weeks Of Treatment: 0 Clustered Wound: No Photos Photo Uploaded By: Harold Barban on 04/14/2018 13:47:00 Wound Measurements Length: (cm) 3.5 Width: (cm) 4 Depth: (cm) 0.1 Area: (cm) 10.996 Volume: (cm) 1.1 % Reduction in Area: % Reduction in Volume: Epithelialization: None Tunneling: No Undermining: No Wound Description Classification: Partial Thickness Wound Margin: Flat and Intact Exudate Amount: Large Exudate Type: Serous Exudate Color: amber Foul Odor After Cleansing: No Slough/Fibrino Yes Wound Bed Granulation Amount: None Present (0%) Exposed Structure Necrotic Amount: Medium (34-66%) Fascia Exposed: No Fat Layer (Subcutaneous Tissue) Exposed: Yes Tendon Exposed: No Muscle Exposed: No Joint Exposed: No Bone Exposed: No Periwound Skin Texture CHEY, RACHELS. ([id number]) Texture Color No Abnormalities Noted: No No Abnormalities Noted: No Callus: No Atrophie Blanche: No Crepitus: No Cyanosis: No Excoriation: No Ecchymosis: No Induration: No Erythema: Yes Rash: No Erythema Location: Circumferential Scarring: No Hemosiderin Staining: No Mottled: No Moisture Pallor: No No Abnormalities  Noted: No Rubor: No Dry / Scaly: Yes Maceration: No Temperature / Pain Temperature: No Abnormality Tenderness on Palpation: Yes Wound Preparation Ulcer Cleansing: Rinsed/Irrigated with Saline Topical Anesthetic Applied: Other: lidocaine 4%, Treatment Notes Wound #1 (Right, Lateral Lower Leg) Notes Gentamicin Electronic Signature(s) Signed: 04/14/2018 5:19:20 PM By: Harold Barban Entered By: Harold Barban on 04/14/2018 11:20:04 [name] ([id number]) -------------------------------------------------------------------------------- Wound Assessment Details Patient Name: TAYLORE, HINDE. Date of Service: 04/14/2018 10:15 AM Medical Record Number: [id number] Patient Account Number: [id number] Date of Birth/Sex: 30-Oct-1931 (85 y.o. Female) Treating RN: Harold Barban Primary Care Demmi Sindt: Lavon Paganini Other Clinician: Referring Ibn Stief: Lavon Paganini  Treating Ponce Skillman/Extender: STONE III, HOYT Weeks in Treatment: 0 Wound Status Wound Number: 2 Primary Etiology: Venous Leg Ulcer Wound Location: Left Lower Leg - Lateral Wound Status: Open Wounding Event: Gradually Appeared Comorbid History: Cataracts, Arrhythmia, Dementia Date Acquired: 04/07/2018 Weeks Of Treatment: 0 Clustered Wound: No Photos Photo Uploaded By: Harold Barban on 04/14/2018 13:47:01 Wound Measurements Length: (cm) 0.9 Width: (cm) 0.6 Depth: (cm) 0.1 Area: (cm) 0.424 Volume: (cm) 0.042 % Reduction in Area: % Reduction in Volume: Epithelialization: None Tunneling: No Undermining: No Wound Description Classification: Partial Thickness Wound Margin: Flat and Intact Exudate Amount: Large Exudate Type: Serous Exudate Color: amber Foul Odor After Cleansing: No Slough/Fibrino Yes Wound Bed Granulation Amount: None Present (0%) Exposed Structure Necrotic Amount: Large (67-100%) Fascia Exposed: No Necrotic Quality: Adherent Slough Fat Layer (Subcutaneous Tissue)  Exposed: Yes Tendon Exposed: No Muscle Exposed: No Joint Exposed: No Bone Exposed: No Periwound Skin Texture JAYDEEN, DARLEY. ([id number]) Texture Color No Abnormalities Noted: No No Abnormalities Noted: No Callus: No Atrophie Blanche: No Crepitus: No Cyanosis: No Excoriation: No Ecchymosis: No Induration: No Erythema: Yes Rash: No Erythema Location: Circumferential Scarring: No Hemosiderin Staining: No Mottled: No Moisture Pallor: No No Abnormalities Noted: No Rubor: No Dry / Scaly: Yes Maceration: No Temperature / Pain Temperature: No Abnormality Tenderness on Palpation: Yes Wound Preparation Ulcer Cleansing: Rinsed/Irrigated with Saline Topical Anesthetic Applied: Other: lidocaine 4%, Treatment Notes Wound #2 (Left, Lateral Lower Leg) Notes Gentamicin Electronic Signature(s) Signed: 04/14/2018 5:19:20 PM By: Harold Barban Entered By: Harold Barban on 04/14/2018 11:22:17 [name] ([id number]) -------------------------------------------------------------------------------- Vitals Details Patient Name: SALLEY, BOXLEY. Date of Service: 04/14/2018 10:15 AM Medical Record Number: [id number] Patient Account Number: [id number] Date of Birth/Sex: 1931/05/03 (85 y.o. Female) Treating RN: Cornell Barman Primary Care Tylin Force: Lavon Paganini Other Clinician: Referring Tahjae Clausing: Lavon Paganini Treating Annali Lybrand/Extender: Melburn Hake, HOYT Weeks in Treatment: 0 Vital Signs Time Taken: 10:21 Temperature (F): 97.6 Height (in): 58 Pulse (bpm): 61 Source: Stated Respiratory Rate (breaths/min): 16 Weight (lbs): 143.4 Blood Pressure (mmHg): 125/62 Source: Measured Reference Range: 80 - 120 mg / dl Body Mass Index (BMI): 30 Electronic Signature(s) Signed: 04/14/2018 3:07:51 PM By: Lorine Bears RCP, RRT, CHT Entered By: Lorine Bears on 04/14/2018 10:24:27

## 2018-04-21 ENCOUNTER — Encounter: Payer: Medicare HMO | Attending: Physician Assistant | Admitting: Physician Assistant

## 2018-04-21 DIAGNOSIS — Z87891 Personal history of nicotine dependence: Secondary | ICD-10-CM | POA: Diagnosis not present

## 2018-04-21 DIAGNOSIS — L97811 Non-pressure chronic ulcer of other part of right lower leg limited to breakdown of skin: Secondary | ICD-10-CM | POA: Diagnosis not present

## 2018-04-21 DIAGNOSIS — I872 Venous insufficiency (chronic) (peripheral): Secondary | ICD-10-CM | POA: Insufficient documentation

## 2018-04-21 DIAGNOSIS — L97812 Non-pressure chronic ulcer of other part of right lower leg with fat layer exposed: Secondary | ICD-10-CM | POA: Diagnosis not present

## 2018-04-21 DIAGNOSIS — F0390 Unspecified dementia without behavioral disturbance: Secondary | ICD-10-CM | POA: Insufficient documentation

## 2018-04-21 DIAGNOSIS — I10 Essential (primary) hypertension: Secondary | ICD-10-CM | POA: Diagnosis not present

## 2018-04-21 DIAGNOSIS — I482 Chronic atrial fibrillation, unspecified: Secondary | ICD-10-CM | POA: Insufficient documentation

## 2018-04-21 DIAGNOSIS — L97821 Non-pressure chronic ulcer of other part of left lower leg limited to breakdown of skin: Secondary | ICD-10-CM | POA: Insufficient documentation

## 2018-04-21 DIAGNOSIS — L97822 Non-pressure chronic ulcer of other part of left lower leg with fat layer exposed: Secondary | ICD-10-CM | POA: Diagnosis not present

## 2018-04-21 DIAGNOSIS — J449 Chronic obstructive pulmonary disease, unspecified: Secondary | ICD-10-CM | POA: Insufficient documentation

## 2018-04-23 NOTE — Progress Notes (Signed)
MONISHA, SIEBEL ([id number]) Visit Report for 04/21/2018 Chief Complaint Document Details Patient Name: Patricia Foley, Patricia Foley. Date of Service: 04/21/2018 9:30 AM Medical Record Number: [id number] Patient Account Number: [id number] Date of Birth/Sex: Feb 02, 1932 (85 y.o. F) Treating RN: Montey Hora Primary Care Provider: Lavon Paganini Other Clinician: Referring Provider: Lavon Paganini Treating Provider/Extender: Melburn Hake, HOYT Weeks in Treatment: 1 Information Obtained from: Patient Chief Complaint Bilateral LE ulcers Electronic Signature(s) Signed: 04/22/2018 6:13:30 AM By: Worthy Keeler PA-C Entered By: Worthy Keeler on 04/21/2018 09:51:06 ANYLA, ISRAELSON ([id number]) -------------------------------------------------------------------------------- HPI Details Patient Name: Patricia Foley, Patricia Foley. Date of Service: 04/21/2018 9:30 AM Medical Record Number: [id number] Patient Account Number: [id number] Date of Birth/Sex: [birth year] (85 y.o. F) Treating RN: Montey Hora Primary Care Provider: Lavon Paganini Other Clinician: Referring Provider: Lavon Paganini Treating Provider/Extender: Melburn Hake, HOYT Weeks in Treatment: 1 History of Present Illness HPI Description: 04/14/18 patient presents today for initial evaluation our clinic concerning wounds that she has of the bilateral lower extremities. There is a clustered area on the right and one area on the left. This has been questioned to potentially be cellulitis and currently she is on doxycycline as prescribed by her primary care provider. She does live in an assisted living facility. The patient does have significant dementia and is very adamant about what she wants or does not want to do. Her grandson who was with her today takes excellent care of her as far as I'm concerned he is very patient with her. She as an example does have to frequently go to the bathroom to urinate but will not were brief and therefore  during her visit today with 4-5 times to the bathroom during the time that she was here. She also has previously been attempted to prevent scratching bag binder some gloves to where she told her grandson that "she wasn't going to do any yard work" and took them off and threw them away. Nonetheless she also more importantly for her wounds will not keep the wraps on. She tends to push them down or try to enroll them and this tends to cause a tourniquet effect around her lower portion of her leg which can be even more detrimental. She otherwise does have a history of COPD, hypertension, and atrial fibrillation but otherwise has no significant past medical history. No fevers, chills, nausea, or vomiting noted at this time. 04/21/18 on evaluation today patient actually appears to be doing much better in regard to her bilateral lower Trinity ulcers. Her grandson has been using the gentamicin cream of the wound areas which I think does seem to be helping her at this point. He is continue to use the use room along with the headquarters owned over the remaining skin of the bilateral lower extremities. Fortunately there's no signs of worsening infection at this point. Electronic Signature(s) Signed: 04/22/2018 6:13:30 AM By: Worthy Keeler PA-C Entered By: Worthy Keeler on 04/21/2018 10:19:06 Patricia Foley ([id number]) -------------------------------------------------------------------------------- Physical Exam Details Patient Name: Patricia Foley, Patricia Foley. Date of Service: 04/21/2018 9:30 AM Medical Record Number: [id number] Patient Account Number: [id number] Date of Birth/Sex: 1931-08-26 (85 y.o. F) Treating RN: Montey Hora Primary Care Provider: Lavon Paganini Other Clinician: Referring Provider: Lavon Paganini Treating Provider/Extender: STONE III, HOYT Weeks in Treatment: 1 Constitutional Well-nourished and well-hydrated in no acute distress. Respiratory normal breathing without  difficulty. clear to auscultation bilaterally. Cardiovascular regular rate and rhythm with normal S1, S2. 1+ pitting edema of the bilateral lower extremities. Psychiatric Patient is  not able to cooperate in decision making regarding care. Patient is oriented to person only. pleasant and cooperative. Notes Patient appears to be doing very well again in regard to her skin and I think that the cream has done an excellent job as far as the gentamicin is concerned in treating what were seen at this point. My suggestion would be that we continue with the above wound care measures. Electronic Signature(s) Signed: 04/22/2018 6:13:30 AM By: Worthy Keeler PA-C Entered By: Worthy Keeler on 04/21/2018 10:19:43 Patricia Foley ([id number]) -------------------------------------------------------------------------------- Physician Orders Details Patient Name: CALANI, GICK. Date of Service: 04/21/2018 9:30 AM Medical Record Number: [id number] Patient Account Number: [id number] Date of Birth/Sex: 1931-08-14 (85 y.o. F) Treating RN: Montey Hora Primary Care Provider: Lavon Paganini Other Clinician: Referring Provider: Lavon Paganini Treating Provider/Extender: Melburn Hake, HOYT Weeks in Treatment: 1 Verbal / Phone Orders: No Diagnosis Coding ICD-10 Coding Code Description I87.2 Venous insufficiency (chronic) (peripheral) L97.821 Non-pressure chronic ulcer of other part of left lower leg limited to breakdown of skin L97.811 Non-pressure chronic ulcer of other part of right lower leg limited to breakdown of skin I10 Essential (primary) hypertension I48.20 Chronic atrial fibrillation, unspecified J44.9 Chronic obstructive pulmonary disease, unspecified Wound Cleansing Wound #1 Right,Lateral Lower Leg o Cleanse wound with mild soap and water Wound #2 Left,Lateral Lower Leg o Cleanse wound with mild soap and water Skin Barriers/Peri-Wound Care Wound #1 Right,Lateral Lower  Leg o Other: - Gentamycin Wound #2 Left,Lateral Lower Leg o Other: - Gentamycin Dressing Change Frequency Wound #1 Right,Lateral Lower Leg o Change dressing every day. Wound #2 Left,Lateral Lower Leg o Change dressing every day. Follow-up Appointments Wound #1 Right,Lateral Lower Leg o Return Appointment in 2 weeks. Wound #2 Left,Lateral Lower Leg o Return Appointment in 2 weeks. Medications-please add to medication list. Wound #1 Right,Lateral Lower Leg o Other: - Gentamycin Wound #2 Left,Lateral Lower Leg ADIA, CRAMMER ([id number]) o Other: - Gentamycin Electronic Signature(s) Signed: 04/21/2018 4:27:08 PM By: Montey Hora Signed: 04/22/2018 6:13:30 AM By: Worthy Keeler PA-C Entered By: Montey Hora on 04/21/2018 09:58:17 CRYSTALANN, KORF ([id number]) -------------------------------------------------------------------------------- Problem List Details Patient Name: RAUSHANAH, OSMUNDSON. Date of Service: 04/21/2018 9:30 AM Medical Record Number: [id number] Patient Account Number: [id number] Date of Birth/Sex: Oct 31, 1931 (85 y.o. F) Treating RN: Montey Hora Primary Care Provider: Lavon Paganini Other Clinician: Referring Provider: Lavon Paganini Treating Provider/Extender: Melburn Hake, HOYT Weeks in Treatment: 1 Active Problems ICD-10 Evaluated Encounter Code Description Active Date Today Diagnosis I87.2 Venous insufficiency (chronic) (peripheral) 04/14/2018 No Yes L97.821 Non-pressure chronic ulcer of other part of left lower leg 04/14/2018 No Yes limited to breakdown of skin L97.811 Non-pressure chronic ulcer of other part of right lower leg 04/14/2018 No Yes limited to breakdown of skin I10 Essential (primary) hypertension 04/14/2018 No Yes I48.20 Chronic atrial fibrillation, unspecified 04/14/2018 No Yes J44.9 Chronic obstructive pulmonary disease, unspecified 04/14/2018 No Yes Inactive Problems Resolved Problems Electronic  Signature(s) Signed: 04/22/2018 6:13:30 AM By: Worthy Keeler PA-C Entered By: Worthy Keeler on 04/21/2018 09:51:02 Patricia Foley ([id number]) -------------------------------------------------------------------------------- Progress Note Details Patient Name: Patricia Foley. Date of Service: 04/21/2018 9:30 AM Medical Record Number: [id number] Patient Account Number: [id number] Date of Birth/Sex: [birth year] (85 y.o. F) Treating RN: Montey Hora Primary Care Provider: Lavon Paganini Other Clinician: Referring Provider: Lavon Paganini Treating Provider/Extender: Melburn Hake, HOYT Weeks in Treatment: 1 Subjective Chief Complaint Information obtained from Patient Bilateral LE ulcers History of Present Illness (HPI) 04/14/18 patient presents today  for initial evaluation our clinic concerning wounds that she has of the bilateral lower extremities. There is a clustered area on the right and one area on the left. This has been questioned to potentially be cellulitis and currently she is on doxycycline as prescribed by her primary care provider. She does live in an assisted living facility. The patient does have significant dementia and is very adamant about what she wants or does not want to do. Her grandson who was with her today takes excellent care of her as far as I'm concerned he is very patient with her. She as an example does have to frequently go to the bathroom to urinate but will not were brief and therefore during her visit today with 4-5 times to the bathroom during the time that she was here. She also has previously been attempted to prevent scratching bag binder some gloves to where she told her grandson that "she wasn't going to do any yard work" and took them off and threw them away. Nonetheless she also more importantly for her wounds will not keep the wraps on. She tends to push them down or try to enroll them and this tends to cause a tourniquet effect around  her lower portion of her leg which can be even more detrimental. She otherwise does have a history of COPD, hypertension, and atrial fibrillation but otherwise has no significant past medical history. No fevers, chills, nausea, or vomiting noted at this time. 04/21/18 on evaluation today patient actually appears to be doing much better in regard to her bilateral lower Trinity ulcers. Her grandson has been using the gentamicin cream of the wound areas which I think does seem to be helping her at this point. He is continue to use the use room along with the headquarters owned over the remaining skin of the bilateral lower extremities. Fortunately there's no signs of worsening infection at this point. Patient History Unable to Obtain Patient History due to Dementia. Information obtained from Patient. Family History No family history of Cancer, Diabetes, Heart Disease, Hereditary Spherocytosis, Hypertension, Kidney Disease, Lung Disease, Seizures, Stroke, Thyroid Problems, Tuberculosis. Social History Former smoker - ended on 02/16/2016, Marital Status - Widowed, Alcohol Use - Never, Drug Use - No History, Caffeine Use - Daily - coffee soda. Medical History Eyes Patient has history of Cataracts - surgery Denies history of Glaucoma, Optic Neuritis Ear/Nose/Mouth/Throat Denies history of Chronic sinus problems/congestion, Middle ear problems Hematologic/Lymphatic Denies history of Anemia, Hemophilia, Human Immunodeficiency Virus, Lymphedema, Sickle Cell Disease Respiratory [place], SEARLES ([id number]) Denies history of Aspiration, Asthma, Chronic Obstructive Pulmonary Disease (COPD), Pneumothorax, Sleep Apnea, Tuberculosis Cardiovascular Patient has history of Arrhythmia - A fib Denies history of Congestive Heart Failure, Coronary Artery Disease, Deep Vein Thrombosis, Hypertension, Hypotension, Myocardial Infarction, Peripheral Arterial Disease, Peripheral Venous Disease, Phlebitis,  Vasculitis Gastrointestinal Denies history of Cirrhosis , Colitis, Crohn s, Hepatitis A, Hepatitis B, Hepatitis C Endocrine Denies history of Type I Diabetes, Type II Diabetes Genitourinary Denies history of End Stage Renal Disease Immunological Denies history of Lupus Erythematosus, Raynaud s, Scleroderma Integumentary (Skin) Denies history of History of Burn, History of pressure wounds Musculoskeletal Denies history of Gout, Rheumatoid Arthritis, Osteoarthritis, Osteomyelitis Neurologic Patient has history of Dementia - 3 years Denies history of Neuropathy, Quadriplegia, Paraplegia, Seizure Disorder Oncologic Denies history of Received Chemotherapy, Received Radiation Psychiatric Denies history of Anorexia/bulimia, Confinement Anxiety Hospitalization/Surgery History - 11/15/2017, [facility], Broncitis. 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 Respiratory The patient has no complaints or symptoms. Cardiovascular Complains or has symptoms of LE edema. Psychiatric The patient has no complaints or symptoms. Objective Constitutional Well-nourished and well-hydrated in no acute distress. Vitals Time Taken: 9:29 AM, Height: 58 in, Weight: 143.4 lbs, BMI: 30, Temperature: 98.0 F, Pulse: 61 bpm, Respiratory Rate: 16 breaths/min, Blood Pressure: 125/49 mmHg. TAYLAH, DUBIEL. ([id number]) Respiratory normal breathing without difficulty. clear to auscultation bilaterally. Cardiovascular regular rate and rhythm with normal S1, S2. 1+ pitting edema of the bilateral lower extremities. Psychiatric Patient is not able to cooperate in decision making regarding care. Patient is oriented to person only. pleasant and cooperative. General Notes: Patient appears to be doing very well again in regard to her skin and I think that the  cream has done an excellent job as far as the gentamicin is concerned in treating what were seen at this point. My suggestion would be that we continue with the above wound care measures. Integumentary (Hair, Skin) Wound #1 status is Open. Original cause of wound was Gradually Appeared. The wound is located on the Right,Lateral Lower Leg. The wound measures 0.1cm length x 0.1cm width x 0.1cm depth; 0.008cm^2 area and 0.001cm^3 volume. There is Fat Layer (Subcutaneous Tissue) Exposed exposed. There is no tunneling or undermining noted. There is a small amount of serous drainage noted. The wound margin is flat and intact. There is no granulation within the wound bed. There is a medium (34-66%) amount of necrotic tissue within the wound bed including Eschar. The periwound skin appearance exhibited: Dry/Scaly, Erythema. The periwound skin appearance did not exhibit: Callus, Crepitus, Excoriation, Induration, Rash, Scarring, Maceration, Atrophie Blanche, Cyanosis, Ecchymosis, Hemosiderin Staining, Mottled, Pallor, Rubor. The surrounding wound skin color is noted with erythema which is circumferential. Periwound temperature was noted as No Abnormality. The periwound has tenderness on palpation. Wound #2 status is Open. Original cause of wound was Gradually Appeared. The wound is located on the Left,Lateral Lower Leg. The wound measures 0.1cm length x 0.1cm width x 0.1cm depth; 0.008cm^2 area and 0.001cm^3 volume. There is Fat Layer (Subcutaneous Tissue) Exposed exposed. There is no tunneling or undermining noted. There is a small amount of serous drainage noted. The wound margin is flat and intact. There is no granulation within the wound bed. There is a large (67-100%) amount of necrotic tissue within the wound bed including Eschar. The periwound skin appearance exhibited: Dry/Scaly, Erythema. The periwound skin appearance did not exhibit: Callus, Crepitus, Excoriation, Induration, Rash, Scarring,  Maceration, Atrophie Blanche, Cyanosis, Ecchymosis, Hemosiderin Staining, Mottled, Pallor, Rubor. The surrounding wound skin color is noted with erythema which is circumferential. Periwound temperature was noted as No Abnormality. The periwound has tenderness on palpation. 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 ([id number]) Wound Cleansing: Wound #1 Right,Lateral Lower Leg: Cleanse wound with mild soap and water Wound #2 Left,Lateral Lower Leg: Cleanse wound with mild soap and water Skin Barriers/Peri-Wound Care: Wound #1 Right,Lateral Lower Leg: Other: - Gentamycin Wound #2 Left,Lateral Lower Leg: Other: - Gentamycin Dressing Change Frequency: Wound #1 Right,Lateral Lower Leg: Change dressing every day. Wound #2 Left,Lateral Lower Leg: Change dressing every day. Follow-up Appointments: Wound #1 Right,Lateral Lower  Leg: Return Appointment in 2 weeks. Wound #2 Left,Lateral Lower Leg: Return Appointment in 2 weeks. Medications-please add to medication list.: Wound #1 Right,Lateral Lower Leg: Other: - Gentamycin Wound #2 Left,Lateral Lower Leg: Other: - Gentamycin We will continue with the above wound care measures of the next two weeks. Patient's grandson is in agreement the plan. He knows if anything changes or worsens he needs to contact the office and let me know ASAP. Hopefully things will go well however. Please see above for specific wound care orders. We will see patient for re-evaluation in 2 week(s) here in the clinic. If anything worsens or changes patient will contact our office for additional  recommendations. Electronic Signature(s) Signed: 04/22/2018 6:13:30 AM By: Worthy Keeler PA-C Entered By: Worthy Keeler on 04/21/2018 10:20:21 Patricia Foley ([id number]) -------------------------------------------------------------------------------- ROS/PFSH Details Patient Name: Patricia Foley, Patricia Foley. Date of Service: 04/21/2018 9:30 AM Medical Record Number: [id number] Patient Account Number: [id number] Date of Birth/Sex: [birth year] (85 y.o. F) Treating RN: Montey Hora Primary Care Provider: Lavon Paganini Other Clinician: Referring Provider: Lavon Paganini Treating Provider/Extender: Melburn Hake, HOYT Weeks in Treatment: 1 Unable to Obtain Patient History due to oo Dementia Information Obtained From Patient Wound History Do you currently have one or more open woundso Yes How many open wounds do you currently haveo 2 Approximately how long have you had your woundso 2 weeks How have you been treating your wound(s) until nowo hydrcordosone, then lostion Has your wound(s) ever healed and then re-openedo No Have you had any lab work done in the past montho No Have you tested positive for an antibiotic resistant organism (MRSA, VRE)o No Have you tested positive for osteomyelitis (bone infection)o No Have you had any tests for circulation on your legso No Have you had other problems associated with your woundso Swelling Constitutional Symptoms (General Health) Complaints and Symptoms: Negative for: Fever; Chills Cardiovascular Complaints and Symptoms: Positive for: LE edema Medical History: Positive for: Arrhythmia - A fib Negative for: Congestive Heart Failure; Coronary Artery Disease; Deep Vein Thrombosis; Hypertension; Hypotension; Myocardial Infarction; Peripheral Arterial Disease; Peripheral Venous Disease; Phlebitis; Vasculitis Eyes Medical History: Positive for: Cataracts - surgery Negative for: Glaucoma; Optic Neuritis Ear/Nose/Mouth/Throat Medical  History: Negative for: Chronic sinus problems/congestion; Middle ear problems Hematologic/Lymphatic Medical History: Negative for: Anemia; Hemophilia; Human Immunodeficiency Virus; Lymphedema; Sickle Cell Disease Patricia Foley, Patricia Foley. ([id number]) Respiratory Complaints and Symptoms: No Complaints or Symptoms Medical History: Negative for: Aspiration; Asthma; Chronic Obstructive Pulmonary Disease (COPD); Pneumothorax; Sleep Apnea; Tuberculosis Gastrointestinal Medical History: Negative for: Cirrhosis ; Colitis; Crohnos; Hepatitis A; Hepatitis B; Hepatitis C Endocrine Medical History: Negative for: Type I Diabetes; Type II Diabetes Genitourinary Medical History: Negative for: End Stage Renal Disease Immunological Medical History: Negative for: Lupus Erythematosus; Raynaudos; Scleroderma Integumentary (Skin) Medical History: Negative for: History of Burn; History of pressure wounds Musculoskeletal Medical History: Negative for: Gout; Rheumatoid Arthritis; Osteoarthritis; Osteomyelitis Past Medical History Notes: arthritis in hands Neurologic Medical History: Positive for: Dementia - 3 years Negative for: Neuropathy; Quadriplegia; Paraplegia; Seizure Disorder Oncologic Medical History: Negative for: Received Chemotherapy; Received Radiation Psychiatric Complaints and Symptoms: No Complaints or Symptoms Medical History: Negative for: Anorexia/bulimia; Confinement Anxiety Patricia Foley, Patricia Foley. ([id number]) HBO Extended History Items Eyes: Cataracts Immunizations Pneumococcal Vaccine: Received Pneumococcal Vaccination: Yes Implantable Devices None Hospitalization / Surgery History Name of Hospital Purpose of Hospitalization/Surgery Date [facility] Broncitis 11/15/2017 Family and Social History Cancer: No; Diabetes: No; Heart Disease: No; Hereditary Spherocytosis: No; Hypertension: No; Kidney Disease: No; Lung Disease: No;  Seizures: No; Stroke: No;  Thyroid Problems: No; Tuberculosis: No; Former smoker - ended on 02/16/2016; Marital Status - Widowed; Alcohol Use: Never; Drug Use: No History; Caffeine Use: Daily - coffee soda; Financial Concerns: No; Food, Clothing or Shelter Needs: No; Support System Lacking: No; Transportation Concerns: No; Living Will: Yes (Not Provided); Medical Power of Attorney: Yes (Not Provided) Physician Affirmation I have reviewed and agree with the above information. Electronic Signature(s) Signed: 04/21/2018 4:27:08 PM By: Montey Hora Signed: 04/22/2018 6:13:30 AM By: Worthy Keeler PA-C Entered By: Worthy Keeler on 04/21/2018 10:19:21 HONG, TIMM ([id number]) -------------------------------------------------------------------------------- SuperBill Details Patient Name: Patricia Foley, Patricia Foley. Date of Service: 04/21/2018 Medical Record Number: [id number] Patient Account Number: [id number] Date of Birth/Sex: 02-Feb-1932 (85 y.o. F) Treating RN: Montey Hora Primary Care Provider: Lavon Paganini Other Clinician: Referring Provider: Lavon Paganini Treating Provider/Extender: Melburn Hake, HOYT Weeks in Treatment: 1 Diagnosis Coding ICD-10 Codes Code Description I87.2 Venous insufficiency (chronic) (peripheral) L97.821 Non-pressure chronic ulcer of other part of left lower leg limited to breakdown of skin L97.811 Non-pressure chronic ulcer of other part of right lower leg limited to breakdown of skin I10 Essential (primary) hypertension I48.20 Chronic atrial fibrillation, unspecified J44.9 Chronic obstructive pulmonary disease, unspecified Facility Procedures CPT4 Code: [id number] Description: 99213 - WOUND CARE VISIT-LEV 3 EST PT Modifier: Quantity: 1 Physician Procedures CPT4 Code Description: [id number] - WC PHYS LEVEL 4 - EST PT ICD-10 Diagnosis Description I87.2 Venous insufficiency (chronic) (peripheral) L97.821 Non-pressure chronic ulcer of other part of left lower leg lim L97.811  Non-pressure chronic ulcer of  other part of right lower leg li I10 Essential (primary) hypertension Modifier: ited to breakdown mited to breakdown Quantity: 1 of skin of skin Electronic Signature(s) Signed: 04/21/2018 4:27:08 PM By: Montey Hora Signed: 04/22/2018 6:13:30 AM By: Worthy Keeler PA-C Entered By: Montey Hora on 04/21/2018 10:36:41

## 2018-04-23 NOTE — Progress Notes (Addendum)
Patricia Foley, Patricia Foley ([id number]) Visit Report for 04/21/2018 Arrival Information Details Patient Name: Patricia Foley, Patricia Foley. Date of Service: 04/21/2018 9:30 AM Medical Record Number: [id number] Patient Account Number: [id number] Date of Birth/Sex: [birth year] (85 y.o. F) Treating RN: Montey Hora Primary Care Pete Schnitzer: Lavon Paganini Other Clinician: Referring Saranne Crislip: Lavon Paganini Treating Limuel Nieblas/Extender: Melburn Hake, HOYT Weeks in Treatment: 1 Visit Information History Since Last Visit Added or deleted any medications: No Patient Arrived: Ambulatory Any new allergies or adverse reactions: No Arrival Time: 09:28 Had a fall or experienced change in No Accompanied By: grandson Tim activities of daily living that may affect Transfer Assistance: None risk of falls: Signs or symptoms of abuse/neglect since last visito No Hospitalized since last visit: No Has Dressing in Place as Prescribed: Yes Pain Present Now: No Electronic Signature(s) Signed: 04/21/2018 3:08:28 PM By: Lorine Bears RCP, RRT, CHT Entered By: Lorine Bears on 04/21/2018 09:29:31 Patricia Foley ([id number]) -------------------------------------------------------------------------------- Clinic Level of Care Assessment Details Patient Name: Patricia Foley, Patricia Foley. Date of Service: 04/21/2018 9:30 AM Medical Record Number: [id number] Patient Account Number: [id number] Date of Birth/Sex: [birth year] (85 y.o. F) Treating RN: Montey Hora Primary Care Arbell Wycoff: Lavon Paganini Other Clinician: Referring Aashika Carta: Lavon Paganini Treating Ailynn Gow/Extender: Melburn Hake, HOYT Weeks in Treatment: 1 Clinic Level of Care Assessment Items TOOL 4 Quantity Score []  - Use when only an EandM is performed on FOLLOW-UP visit 0 ASSESSMENTS - Nursing Assessment / Reassessment X - Reassessment of Co-morbidities (includes updates in patient status) 1 10 X- 1 5 Reassessment of Adherence to  Treatment Plan ASSESSMENTS - Wound and Skin Assessment / Reassessment []  - Simple Wound Assessment / Reassessment - one wound 0 X- 2 5 Complex Wound Assessment / Reassessment - multiple wounds []  - 0 Dermatologic / Skin Assessment (not related to wound area) ASSESSMENTS - Focused Assessment []  - Circumferential Edema Measurements - multi extremities 0 []  - 0 Nutritional Assessment / Counseling / Intervention X- 1 5 Lower Extremity Assessment (monofilament, tuning fork, pulses) []  - 0 Peripheral Arterial Disease Assessment (using hand held doppler) ASSESSMENTS - Ostomy and/or Continence Assessment and Care []  - Incontinence Assessment and Management 0 []  - 0 Ostomy Care Assessment and Management (repouching, etc.) PROCESS - Coordination of Care X - Simple Patient / Family Education for ongoing care 1 15 []  - 0 Complex (extensive) Patient / Family Education for ongoing care X- 1 10 Staff obtains Programmer, systems, Records, Test Results / Process Orders []  - 0 Staff telephones HHA, Nursing Homes / Clarify orders / etc []  - 0 Routine Transfer to another Facility (non-emergent condition) []  - 0 Routine Hospital Admission (non-emergent condition) []  - 0 New Admissions / Biomedical engineer / Ordering NPWT, Apligraf, etc. []  - 0 Emergency Hospital Admission (emergent condition) X- 1 10 Simple Discharge Coordination Patricia Foley, Patricia Foley ([id number]) []  - 0 Complex (extensive) Discharge Coordination PROCESS - Special Needs []  - Pediatric / Minor Patient Management 0 []  - 0 Isolation Patient Management []  - 0 Hearing / Language / Visual special needs []  - 0 Assessment of Community assistance (transportation, D/C planning, etc.) []  - 0 Additional assistance / Altered mentation []  - 0 Support Surface(s) Assessment (bed, cushion, seat, etc.) INTERVENTIONS - Wound Cleansing / Measurement []  - Simple Wound Cleansing - one wound 0 X- 2 5 Complex Wound Cleansing - multiple wounds X-  1 5 Wound Imaging (photographs - any number of wounds) []  - 0 Wound Tracing (instead of photographs) []  - 0 Simple Wound Measurement - one wound X-  2 5 Complex Wound Measurement - multiple wounds INTERVENTIONS - Wound Dressings []  - Small Wound Dressing one or multiple wounds 0 []  - 0 Medium Wound Dressing one or multiple wounds []  - 0 Large Wound Dressing one or multiple wounds X- 1 5 Application of Medications - topical []  - 0 Application of Medications - injection INTERVENTIONS - Miscellaneous []  - External ear exam 0 []  - 0 Specimen Collection (cultures, biopsies, blood, body fluids, etc.) []  - 0 Specimen(s) / Culture(s) sent or taken to Lab for analysis []  - 0 Patient Transfer (multiple staff / Civil Service fast streamer / Similar devices) []  - 0 Simple Staple / Suture removal (25 or less) []  - 0 Complex Staple / Suture removal (26 or more) []  - 0 Hypo / Hyperglycemic Management (close monitor of Blood Glucose) []  - 0 Ankle / Brachial Index (ABI) - do not check if billed separately X- 1 5 Vital Signs Patricia Foley, Patricia Foley. ([id number]) Has the patient been seen at the hospital within the last three years: Yes Total Score: 100 Level Of Care: New/Established - Level 3 Electronic Signature(s) Signed: 04/21/2018 4:27:08 PM By: Montey Hora Entered By: Montey Hora on 04/21/2018 10:36:26 Patricia Foley ([id number]) -------------------------------------------------------------------------------- Encounter Discharge Information Details Patient Name: Patricia Foley, Patricia Foley. Date of Service: 04/21/2018 9:30 AM Medical Record Number: [id number] Patient Account Number: [id number] Date of Birth/Sex: 02-08-1932 (85 y.o. F) Treating RN: Montey Hora Primary Care Marisal Swarey: Lavon Paganini Other Clinician: Referring Maureena Dabbs: Lavon Paganini Treating Yarrow Linhart/Extender: Melburn Hake, HOYT Weeks in Treatment: 1 Encounter Discharge Information Items Discharge Condition: Stable Ambulatory  Status: Ambulatory Discharge Destination: Home Transportation: Private Auto Accompanied By: grandson Schedule Follow-up Appointment: Yes Clinical Summary of Care: Electronic Signature(s) Signed: 04/21/2018 4:27:08 PM By: Montey Hora Entered By: Montey Hora on 04/21/2018 10:37:18 Patricia Foley ([id number]) -------------------------------------------------------------------------------- Lower Extremity Assessment Details Patient Name: Patricia Foley, Patricia Foley. Date of Service: 04/21/2018 9:30 AM Medical Record Number: [id number] Patient Account Number: [id number] Date of Birth/Sex: Jun 27, 1931 (85 y.o. F) Treating RN: Army Melia Primary Care Johari Pinney: Lavon Paganini Other Clinician: Referring Massey Ruhland: Lavon Paganini Treating Avriana Joo/Extender: Melburn Hake, HOYT Weeks in Treatment: 1 Edema Assessment Assessed: [Left: No] [Right: No] Edema: [Left: No] [Right: No] Calf Left: Right: Point of Measurement: 31 cm From Medial Instep 37 cm 35 cm Ankle Left: Right: Point of Measurement: 12 cm From Medial Instep 26 cm 24 cm Vascular Assessment Pulses: Dorsalis Pedis Palpable: [Left:Yes] [Right:Yes] Posterior Tibial Extremity colors, hair growth, and conditions: Extremity Color: [Left:Red] [Right:Red] Hair Growth on Extremity: [Left:No] [Right:No] Temperature of Extremity: [Left:Warm] [Right:Warm] Capillary Refill: [Left:< 3 seconds] [Right:< 3 seconds] Toe Nail Assessment Left: Right: Thick: Yes Yes Discolored: Yes Yes Deformed: No No Improper Length and Hygiene: No No Electronic Signature(s) Signed: 04/21/2018 3:17:07 PM By: Army Melia Entered By: Army Melia on 04/21/2018 09:33:45 Stroh, Murvin Natal ([id number]) -------------------------------------------------------------------------------- Multi Wound Chart Details Patient Name: Patricia Foley. Date of Service: 04/21/2018 9:30 AM Medical Record Number: [id number] Patient Account Number: [id number] Date of  Birth/Sex: Jun 25, 1931 (85 y.o. F) Treating RN: Montey Hora Primary Care Reia Viernes: Lavon Paganini Other Clinician: Referring Tomika Eckles: Lavon Paganini Treating Walida Cajas/Extender: STONE III, HOYT Weeks in Treatment: 1 Vital Signs Height(in): 58 Pulse(bpm): 61 Weight(lbs): 143.4 Blood Pressure(mmHg): 125/49 Body Mass Index(BMI): 30 Temperature(F): 98.0 Respiratory Rate 16 (breaths/min): Photos: [N/A:N/A] Wound Location: Right Lower Leg - Lateral Left Lower Leg - Lateral N/A Wounding Event: Gradually Appeared Gradually Appeared N/A Primary Etiology: Venous Leg Ulcer Venous Leg Ulcer N/A Comorbid History: Cataracts, Arrhythmia, Cataracts, Arrhythmia, N/A  Dementia Dementia Date Acquired: 04/07/2018 04/07/2018 N/A Weeks of Treatment: 1 1 N/A Wound Status: Open Open N/A Measurements L x W x D 0.1x0.1x0.1 0.1x0.1x0.1 N/A (cm) Area (cm) : 0.008 0.008 N/A Volume (cm) : 0.001 0.001 N/A % Reduction in Area: 99.90% 98.10% N/A % Reduction in Volume: 99.90% 97.60% N/A Classification: Partial Thickness Partial Thickness N/A Exudate Amount: Small Small N/A Exudate Type: Serous Serous N/A Exudate Color: amber amber N/A Wound Margin: Flat and Intact Flat and Intact N/A Granulation Amount: None Present (0%) None Present (0%) N/A Necrotic Amount: Medium (34-66%) Large (67-100%) N/A Necrotic Tissue: Eschar Eschar N/A Exposed Structures: Fat Layer (Subcutaneous Fat Layer (Subcutaneous N/A Tissue) Exposed: Yes Tissue) Exposed: Yes Fascia: No Fascia: No Tendon: No Tendon: No Muscle: No Muscle: No Joint: No Joint: No Bone: No Bone: No Patricia Foley, Patricia Foley. ([id number]) Epithelialization: Small (1-33%) None N/A Erythema Location: Circumferential Circumferential N/A Wound Preparation: Ulcer Cleansing: Ulcer Cleansing: N/A Rinsed/Irrigated with Saline Rinsed/Irrigated with Saline Topical Anesthetic Applied: Topical Anesthetic Applied: Other: lidocaine 4% Other: lidocaine  4% Treatment Notes Electronic Signature(s) Signed: 04/21/2018 4:27:08 PM By: Montey Hora Entered By: Montey Hora on 04/21/2018 09:56:46 Patricia Foley ([id number]) -------------------------------------------------------------------------------- [place] Details Patient Name: MORGIN, HALLS. Date of Service: 04/21/2018 9:30 AM Medical Record Number: [id number] Patient Account Number: [id number] Date of Birth/Sex: Jan 23, 1932 (85 y.o. F) Treating RN: Montey Hora Primary Care Navika Hoopes: Lavon Paganini Other Clinician: Referring Avenly Roberge: Lavon Paganini Treating Korbin Notaro/Extender: Melburn Hake, HOYT Weeks in Treatment: 1 Active Inactive Electronic Signature(s) Signed: 08/02/2018 11:40:26 AM By: Gretta Cool, BSN, RN, CWS, Kim RN, BSN Signed: 10/13/2018 1:05:02 PM By: Montey Hora Previous Signature: 04/21/2018 4:27:08 PM Version By: Montey Hora Entered By: Gretta Cool BSN, RN, CWS, Kim on 08/02/2018 11:40:25 Patricia Foley, Patricia Foley ([id number]) -------------------------------------------------------------------------------- Pain Assessment Details Patient Name: Patricia Foley, Patricia Foley. Date of Service: 04/21/2018 9:30 AM Medical Record Number: [id number] Patient Account Number: [id number] Date of Birth/Sex: 09-11-1931 (85 y.o. F) Treating RN: Montey Hora Primary Care Lilo Wallington: Lavon Paganini Other Clinician: Referring Chloey Ricard: Lavon Paganini Treating [place] Severs/Extender: Melburn Hake, HOYT Weeks in Treatment: 1 Active Problems Location of Pain Severity and Description of Pain Patient Has Paino No Site Locations Pain Management and Medication Current Pain Management: Electronic Signature(s) Signed: 04/21/2018 3:08:28 PM By: Paulla Fore, RRT, CHT Signed: 04/21/2018 4:27:08 PM By: Montey Hora Entered By: Lorine Bears on 04/21/2018 09:29:40 Patricia Foley  ([id number]) -------------------------------------------------------------------------------- Patient/Caregiver Education Details Patient Name: Patricia Foley, Patricia Foley. Date of Service: 04/21/2018 9:30 AM Medical Record Number: [id number] Patient Account Number: [id number] Date of Birth/Gender: Dec 22, 1931 (85 y.o. F) Treating RN: Montey Hora Primary Care Physician: Lavon Paganini Other Clinician: Referring Physician: Lavon Paganini Treating Physician/Extender: Sharalyn Ink in Treatment: 1 Education Assessment Education Provided To: Caregiver Education Topics Provided Wound/Skin Impairment: Handouts: Other: contineu with gentamicin cream and lotion Methods: Explain/Verbal Responses: State content correctly Electronic Signature(s) Signed: 04/21/2018 4:27:08 PM By: Montey Hora Entered By: Montey Hora on 04/21/2018 10:37:44 Patricia Foley ([id number]) -------------------------------------------------------------------------------- Wound Assessment Details Patient Name: Patricia Foley, Patricia Foley. Date of Service: 04/21/2018 9:30 AM Medical Record Number: [id number] Patient Account Number: [id number] Date of Birth/Sex: 1931-10-06 (85 y.o. F) Treating RN: Army Melia Primary Care Sipriano Fendley: Lavon Paganini Other Clinician: Referring Kristjan Derner: Lavon Paganini Treating Eloyce Bultman/Extender: STONE III, HOYT Weeks in Treatment: 1 Wound Status Wound Number: 1 Primary Etiology: Venous Leg Ulcer Wound Location: Right Lower Leg - Lateral Wound Status: Open Wounding Event: Gradually Appeared Comorbid History: Cataracts, Arrhythmia, Dementia Date Acquired: 04/07/2018 Weeks Of  Treatment: 1 Clustered Wound: No Photos Photo Uploaded By: Army Melia on 04/21/2018 09:50:59 Wound Measurements Length: (cm) 0.1 Width: (cm) 0.1 Depth: (cm) 0.1 Area: (cm) 0.008 Volume: (cm) 0.001 % Reduction in Area: 99.9% % Reduction in Volume: 99.9% Epithelialization: Small  (1-33%) Tunneling: No Undermining: No Wound Description Classification: Partial Thickness Foul Odo Wound Margin: Flat and Intact Slough/F Exudate Amount: Small Exudate Type: Serous Exudate Color: amber r After Cleansing: No ibrino Yes Wound Bed Granulation Amount: None Present (0%) Exposed Structure Necrotic Amount: Medium (34-66%) Fascia Exposed: No Necrotic Quality: Eschar Fat Layer (Subcutaneous Tissue) Exposed: Yes Tendon Exposed: No Muscle Exposed: No Joint Exposed: No Bone Exposed: No Periwound Skin Texture RONALEE, SCHEUNEMANN. ([id number]) Texture Color No Abnormalities Noted: No No Abnormalities Noted: No Callus: No Atrophie Blanche: No Crepitus: No Cyanosis: No Excoriation: No Ecchymosis: No Induration: No Erythema: Yes Rash: No Erythema Location: Circumferential Scarring: No Hemosiderin Staining: No Mottled: No Moisture Pallor: No No Abnormalities Noted: No Rubor: No Dry / Scaly: Yes Maceration: No Temperature / Pain Temperature: No Abnormality Tenderness on Palpation: Yes Wound Preparation Ulcer Cleansing: Rinsed/Irrigated with Saline Topical Anesthetic Applied: Other: lidocaine 4%, Electronic Signature(s) Signed: 04/21/2018 3:17:07 PM By: Army Melia Entered By: Army Melia on 04/21/2018 09:31:44 Patricia Foley ([id number]) -------------------------------------------------------------------------------- Wound Assessment Details Patient Name: MALIYA, MARICH. Date of Service: 04/21/2018 9:30 AM Medical Record Number: [id number] Patient Account Number: [id number] Date of Birth/Sex: 05-07-1931 (85 y.o. F) Treating RN: Army Melia Primary Care Arius Harnois: Lavon Paganini Other Clinician: Referring Frederic Tones: Lavon Paganini Treating Okema Rollinson/Extender: STONE III, HOYT Weeks in Treatment: 1 Wound Status Wound Number: 2 Primary Etiology: Venous Leg Ulcer Wound Location: Left Lower Leg - Lateral Wound Status: Open Wounding Event:  Gradually Appeared Comorbid History: Cataracts, Arrhythmia, Dementia Date Acquired: 04/07/2018 Weeks Of Treatment: 1 Clustered Wound: No Photos Photo Uploaded By: Army Melia on 04/21/2018 09:51:00 Wound Measurements Length: (cm) 0.1 Width: (cm) 0.1 Depth: (cm) 0.1 Area: (cm) 0.008 Volume: (cm) 0.001 % Reduction in Area: 98.1% % Reduction in Volume: 97.6% Epithelialization: None Tunneling: No Undermining: No Wound Description Classification: Partial Thickness Wound Margin: Flat and Intact Exudate Amount: Small Exudate Type: Serous Exudate Color: amber Foul Odor After Cleansing: No Slough/Fibrino Yes Wound Bed Granulation Amount: None Present (0%) Exposed Structure Necrotic Amount: Large (67-100%) Fascia Exposed: No Necrotic Quality: Eschar Fat Layer (Subcutaneous Tissue) Exposed: Yes Tendon Exposed: No Muscle Exposed: No Joint Exposed: No Bone Exposed: No Periwound Skin Texture CLYTIE, SHETLEY. ([id number]) Texture Color No Abnormalities Noted: No No Abnormalities Noted: No Callus: No Atrophie Blanche: No Crepitus: No Cyanosis: No Excoriation: No Ecchymosis: No Induration: No Erythema: Yes Rash: No Erythema Location: Circumferential Scarring: No Hemosiderin Staining: No Mottled: No Moisture Pallor: No No Abnormalities Noted: No Rubor: No Dry / Scaly: Yes Maceration: No Temperature / Pain Temperature: No Abnormality Tenderness on Palpation: Yes Wound Preparation Ulcer Cleansing: Rinsed/Irrigated with Saline Topical Anesthetic Applied: Other: lidocaine 4%, Electronic Signature(s) Signed: 04/21/2018 3:17:07 PM By: Army Melia Entered By: Army Melia on 04/21/2018 09:32:08 Patricia Foley ([id number]) -------------------------------------------------------------------------------- [place] Details Patient Name: JEDA, PARDUE. Date of Service: 04/21/2018 9:30 AM Medical Record Number: [id number] Patient Account Number: [id number] Date of  Birth/Sex: 1931/03/10 (85 y.o. F) Treating RN: Montey Hora Primary Care Ellanore Vanhook: Lavon Paganini Other Clinician: Referring Leanna Hamid: Lavon Paganini Treating Vikash Nest/Extender: STONE III, HOYT Weeks in Treatment: 1 Vital Signs Time Taken: 09:29 Temperature (F): 98.0 Height (in): 58 Pulse (bpm): 61 Weight (lbs): 143.4 Respiratory Rate (breaths/min): 16 Body Mass Index (BMI):  30 Blood Pressure (mmHg): 125/49 Reference Range: 80 - 120 mg / dl Electronic Signature(s) Signed: 04/21/2018 3:08:28 PM By: Lorine Bears RCP, RRT, CHT Entered By: Lorine Bears on 04/21/2018 09:30:14

## 2018-05-01 ENCOUNTER — Telehealth: Payer: Self-pay | Admitting: Family Medicine

## 2018-05-01 NOTE — Telephone Encounter (Signed)
The telephone number on file for the patient belongs to another person who is not related to the patient and is not a patient here.  [name]

## 2018-05-03 ENCOUNTER — Telehealth: Payer: Self-pay

## 2018-05-03 NOTE — Telephone Encounter (Signed)
Spoke with grandson Research scientist (life sciences)) and he states that the assistant living facility will not allow family to visit so he was fine with rescheduling patient appointment. Tim states that he will call back to let us know if patient needs refill on medication.

## 2018-05-04 ENCOUNTER — Ambulatory Visit: Payer: Medicare HMO | Admitting: Family Medicine

## 2018-05-05 ENCOUNTER — Ambulatory Visit: Payer: Medicare HMO | Admitting: Physician Assistant

## 2018-05-25 ENCOUNTER — Telehealth: Payer: Self-pay | Admitting: Family Medicine

## 2018-05-25 NOTE — Telephone Encounter (Signed)
Tiffany with [name] called saying pt has had ongoing weeping area on legs.  She is asking for nurse care and an antibiotic  Call back [phone]  Thanks [name]

## 2018-05-25 NOTE — Telephone Encounter (Signed)
She was supposed to see wound care.  The ongoing weeping is related to excess fluid and swelling.  Unless it is purulent, it is unlikely to be an infection.  She needs TED hose and leg elevation.

## 2018-05-26 NOTE — Telephone Encounter (Signed)
Spoke to [employer]. She states that the patient refuses to wear the hoses, and has not seen wound care. In the past a home health nurse has done treatment but nothing has been ordered recently.

## 2018-05-29 NOTE — Telephone Encounter (Signed)
Spoke with Patricia Foley at [facility]. 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 She is requesting a order be faxed to her at [phone]. Patricia Foley advised Dr. Brita Romp will not be in the office until Wednesday to sign the order.

## 2018-05-29 NOTE — Telephone Encounter (Signed)
Do they not have nurses that work at [employer]?  We could order home health RN for wound management, but the family had me under the impression that there were nurses at [facility].

## 2018-05-30 ENCOUNTER — Other Ambulatory Visit: Payer: Self-pay | Admitting: Family Medicine

## 2018-06-02 ENCOUNTER — Telehealth: Payer: Self-pay

## 2018-06-02 DIAGNOSIS — I872 Venous insufficiency (chronic) (peripheral): Secondary | ICD-10-CM | POA: Diagnosis not present

## 2018-06-02 DIAGNOSIS — I5030 Unspecified diastolic (congestive) heart failure: Secondary | ICD-10-CM | POA: Diagnosis not present

## 2018-06-02 DIAGNOSIS — G309 Alzheimer's disease, unspecified: Secondary | ICD-10-CM | POA: Diagnosis not present

## 2018-06-02 DIAGNOSIS — F0281 Dementia in other diseases classified elsewhere with behavioral disturbance: Secondary | ICD-10-CM | POA: Diagnosis not present

## 2018-06-02 DIAGNOSIS — L97821 Non-pressure chronic ulcer of other part of left lower leg limited to breakdown of skin: Secondary | ICD-10-CM | POA: Diagnosis not present

## 2018-06-02 DIAGNOSIS — J449 Chronic obstructive pulmonary disease, unspecified: Secondary | ICD-10-CM | POA: Diagnosis not present

## 2018-06-02 DIAGNOSIS — I110 Hypertensive heart disease with heart failure: Secondary | ICD-10-CM | POA: Diagnosis not present

## 2018-06-02 DIAGNOSIS — I4891 Unspecified atrial fibrillation: Secondary | ICD-10-CM | POA: Diagnosis not present

## 2018-06-02 DIAGNOSIS — I83893 Varicose veins of bilateral lower extremities with other complications: Secondary | ICD-10-CM | POA: Diagnosis not present

## 2018-06-02 NOTE — Telephone Encounter (Signed)
Received notification from [facility] that Caryl Pina called from [facility] requesting orders. Message did not say which orders are being requested. LMTCB

## 2018-06-02 NOTE — Telephone Encounter (Signed)
Patricia Foley from [facility] called requesting orders for pt.  Call back # [phone]

## 2018-06-05 ENCOUNTER — Telehealth: Payer: Self-pay | Admitting: Family Medicine

## 2018-06-05 NOTE — Telephone Encounter (Signed)
Ok to give verbal if they will accept, or I can sign and fax order on Wednesday when I am in the office

## 2018-06-05 NOTE — Telephone Encounter (Signed)
LMTCB

## 2018-06-05 NOTE — Telephone Encounter (Signed)
Patricia Foley is also requesting the same treatment for the leg wounds as patient had before. She states the treatments was Doxy and cream for her legs. They are requesting the same RX be sent to [facility].

## 2018-06-05 NOTE — Telephone Encounter (Signed)
See other phone message  

## 2018-06-05 NOTE — Telephone Encounter (Signed)
Caryl Pina with [facility] called saying she would like verbal orders for treating wounds on patients leg  She said the patient has been to the wound care center before for the same thing and the treatment they gave healed patients leg.  She wants to know if she can use the same treatment for patient.  CB# [phone]  Thanks [name]

## 2018-06-06 DIAGNOSIS — I872 Venous insufficiency (chronic) (peripheral): Secondary | ICD-10-CM | POA: Diagnosis not present

## 2018-06-06 DIAGNOSIS — I83893 Varicose veins of bilateral lower extremities with other complications: Secondary | ICD-10-CM | POA: Diagnosis not present

## 2018-06-06 DIAGNOSIS — G309 Alzheimer's disease, unspecified: Secondary | ICD-10-CM | POA: Diagnosis not present

## 2018-06-06 DIAGNOSIS — I110 Hypertensive heart disease with heart failure: Secondary | ICD-10-CM | POA: Diagnosis not present

## 2018-06-06 DIAGNOSIS — J449 Chronic obstructive pulmonary disease, unspecified: Secondary | ICD-10-CM | POA: Diagnosis not present

## 2018-06-06 DIAGNOSIS — F0281 Dementia in other diseases classified elsewhere with behavioral disturbance: Secondary | ICD-10-CM | POA: Diagnosis not present

## 2018-06-06 DIAGNOSIS — I5030 Unspecified diastolic (congestive) heart failure: Secondary | ICD-10-CM | POA: Diagnosis not present

## 2018-06-06 DIAGNOSIS — I4891 Unspecified atrial fibrillation: Secondary | ICD-10-CM | POA: Diagnosis not present

## 2018-06-06 DIAGNOSIS — L97821 Non-pressure chronic ulcer of other part of left lower leg limited to breakdown of skin: Secondary | ICD-10-CM | POA: Diagnosis not present

## 2018-06-07 NOTE — Telephone Encounter (Signed)
LMTCB

## 2018-06-07 NOTE — Telephone Encounter (Signed)
I do not know what cream was Rx'd for her legs before.  If they appear infected, we can Rx Doxycycline 100mg  BID x7d, #14 r0.  However, her biggest issue is skin breakdown from chronic venous stasis that releases clear fluid and appears red even when not infected.  Signs of infection are purulent drainage, fever, and extreme tenderness of legs to touch.  Compression wraps and Unna boots are helpful to treat this condition

## 2018-06-08 DIAGNOSIS — I110 Hypertensive heart disease with heart failure: Secondary | ICD-10-CM | POA: Diagnosis not present

## 2018-06-08 DIAGNOSIS — G309 Alzheimer's disease, unspecified: Secondary | ICD-10-CM | POA: Diagnosis not present

## 2018-06-08 DIAGNOSIS — F0281 Dementia in other diseases classified elsewhere with behavioral disturbance: Secondary | ICD-10-CM | POA: Diagnosis not present

## 2018-06-08 DIAGNOSIS — J449 Chronic obstructive pulmonary disease, unspecified: Secondary | ICD-10-CM | POA: Diagnosis not present

## 2018-06-08 DIAGNOSIS — I872 Venous insufficiency (chronic) (peripheral): Secondary | ICD-10-CM | POA: Diagnosis not present

## 2018-06-08 DIAGNOSIS — I83893 Varicose veins of bilateral lower extremities with other complications: Secondary | ICD-10-CM | POA: Diagnosis not present

## 2018-06-08 DIAGNOSIS — I4891 Unspecified atrial fibrillation: Secondary | ICD-10-CM | POA: Diagnosis not present

## 2018-06-08 DIAGNOSIS — L97821 Non-pressure chronic ulcer of other part of left lower leg limited to breakdown of skin: Secondary | ICD-10-CM | POA: Diagnosis not present

## 2018-06-08 DIAGNOSIS — I5030 Unspecified diastolic (congestive) heart failure: Secondary | ICD-10-CM | POA: Diagnosis not present

## 2018-06-08 NOTE — Telephone Encounter (Signed)
Patricia Foley reports patient was using Gentamycin cream previously and her legs healed. She is requesting a order for Gentamycin and [facility].

## 2018-06-09 NOTE — Telephone Encounter (Signed)
She does not need both oral antibiotic and topical antibiotic. Can call in doxy as below. No gentamicin ointment. She needs wound management with unna boots or wraps.

## 2018-06-12 ENCOUNTER — Telehealth: Payer: Self-pay | Admitting: Family Medicine

## 2018-06-12 MED ORDER — DOXYCYCLINE HYCLATE 100 MG PO TABS: 100.0000 mg | ORAL_TABLET | Freq: Two times a day (BID) | ORAL | 0 refills | Status: DC

## 2018-06-12 NOTE — Telephone Encounter (Signed)
Spoke with Caryl Pina advised as last telephone message.

## 2018-06-12 NOTE — Telephone Encounter (Signed)
°  [place] w/ [facility] [phone] Needing to speak with [facility] regarding pt.  Thanks, [employer]

## 2018-06-13 DIAGNOSIS — I83893 Varicose veins of bilateral lower extremities with other complications: Secondary | ICD-10-CM | POA: Diagnosis not present

## 2018-06-13 DIAGNOSIS — I4891 Unspecified atrial fibrillation: Secondary | ICD-10-CM | POA: Diagnosis not present

## 2018-06-13 DIAGNOSIS — I110 Hypertensive heart disease with heart failure: Secondary | ICD-10-CM | POA: Diagnosis not present

## 2018-06-13 DIAGNOSIS — J449 Chronic obstructive pulmonary disease, unspecified: Secondary | ICD-10-CM | POA: Diagnosis not present

## 2018-06-13 DIAGNOSIS — F0281 Dementia in other diseases classified elsewhere with behavioral disturbance: Secondary | ICD-10-CM | POA: Diagnosis not present

## 2018-06-13 DIAGNOSIS — I5030 Unspecified diastolic (congestive) heart failure: Secondary | ICD-10-CM | POA: Diagnosis not present

## 2018-06-13 DIAGNOSIS — I872 Venous insufficiency (chronic) (peripheral): Secondary | ICD-10-CM | POA: Diagnosis not present

## 2018-06-13 DIAGNOSIS — G309 Alzheimer's disease, unspecified: Secondary | ICD-10-CM | POA: Diagnosis not present

## 2018-06-13 DIAGNOSIS — L97821 Non-pressure chronic ulcer of other part of left lower leg limited to breakdown of skin: Secondary | ICD-10-CM | POA: Diagnosis not present

## 2018-06-15 ENCOUNTER — Telehealth: Payer: Self-pay | Admitting: Family Medicine

## 2018-06-15 DIAGNOSIS — L97821 Non-pressure chronic ulcer of other part of left lower leg limited to breakdown of skin: Secondary | ICD-10-CM | POA: Diagnosis not present

## 2018-06-15 DIAGNOSIS — I5030 Unspecified diastolic (congestive) heart failure: Secondary | ICD-10-CM | POA: Diagnosis not present

## 2018-06-15 DIAGNOSIS — J449 Chronic obstructive pulmonary disease, unspecified: Secondary | ICD-10-CM | POA: Diagnosis not present

## 2018-06-15 DIAGNOSIS — I872 Venous insufficiency (chronic) (peripheral): Secondary | ICD-10-CM | POA: Diagnosis not present

## 2018-06-15 DIAGNOSIS — F0281 Dementia in other diseases classified elsewhere with behavioral disturbance: Secondary | ICD-10-CM | POA: Diagnosis not present

## 2018-06-15 DIAGNOSIS — I110 Hypertensive heart disease with heart failure: Secondary | ICD-10-CM | POA: Diagnosis not present

## 2018-06-15 DIAGNOSIS — I4891 Unspecified atrial fibrillation: Secondary | ICD-10-CM | POA: Diagnosis not present

## 2018-06-15 DIAGNOSIS — G309 Alzheimer's disease, unspecified: Secondary | ICD-10-CM | POA: Diagnosis not present

## 2018-06-15 DIAGNOSIS — I83893 Varicose veins of bilateral lower extremities with other complications: Secondary | ICD-10-CM | POA: Diagnosis not present

## 2018-06-15 NOTE — Telephone Encounter (Signed)
This is ok to wait until tomorrow. It is more of an FYI.

## 2018-06-15 NOTE — Telephone Encounter (Signed)
Diane from [facility] called saying her Unna compression Boots off yesterday and is not wearing these appropriately.  Please advise  CB# [phone]  Thanks [name]

## 2018-06-15 NOTE — Telephone Encounter (Signed)
This is Dr. Sharmaine Base patient. Please review to determine if it should wait for Dr. B to review tomorrow.

## 2018-06-16 NOTE — Telephone Encounter (Signed)
Noted  

## 2018-06-20 DIAGNOSIS — I4891 Unspecified atrial fibrillation: Secondary | ICD-10-CM | POA: Diagnosis not present

## 2018-06-20 DIAGNOSIS — J449 Chronic obstructive pulmonary disease, unspecified: Secondary | ICD-10-CM | POA: Diagnosis not present

## 2018-06-20 DIAGNOSIS — G309 Alzheimer's disease, unspecified: Secondary | ICD-10-CM | POA: Diagnosis not present

## 2018-06-20 DIAGNOSIS — L97821 Non-pressure chronic ulcer of other part of left lower leg limited to breakdown of skin: Secondary | ICD-10-CM | POA: Diagnosis not present

## 2018-06-20 DIAGNOSIS — I872 Venous insufficiency (chronic) (peripheral): Secondary | ICD-10-CM | POA: Diagnosis not present

## 2018-06-20 DIAGNOSIS — I110 Hypertensive heart disease with heart failure: Secondary | ICD-10-CM | POA: Diagnosis not present

## 2018-06-20 DIAGNOSIS — F0281 Dementia in other diseases classified elsewhere with behavioral disturbance: Secondary | ICD-10-CM | POA: Diagnosis not present

## 2018-06-20 DIAGNOSIS — I5030 Unspecified diastolic (congestive) heart failure: Secondary | ICD-10-CM | POA: Diagnosis not present

## 2018-06-20 DIAGNOSIS — I83893 Varicose veins of bilateral lower extremities with other complications: Secondary | ICD-10-CM | POA: Diagnosis not present

## 2018-06-21 ENCOUNTER — Telehealth: Payer: Self-pay

## 2018-06-21 NOTE — Telephone Encounter (Signed)
Spoke to pt's POA Sherin Quarry) and he advised that pt has dementia and is at [facility] facility and is not able to answer the PHQ questions.  dbs

## 2018-06-21 NOTE — Telephone Encounter (Signed)
LMTCB for PHQ screening

## 2018-06-23 DIAGNOSIS — I5030 Unspecified diastolic (congestive) heart failure: Secondary | ICD-10-CM | POA: Diagnosis not present

## 2018-06-23 DIAGNOSIS — J449 Chronic obstructive pulmonary disease, unspecified: Secondary | ICD-10-CM | POA: Diagnosis not present

## 2018-06-23 DIAGNOSIS — I4891 Unspecified atrial fibrillation: Secondary | ICD-10-CM | POA: Diagnosis not present

## 2018-06-23 DIAGNOSIS — L97821 Non-pressure chronic ulcer of other part of left lower leg limited to breakdown of skin: Secondary | ICD-10-CM | POA: Diagnosis not present

## 2018-06-23 DIAGNOSIS — I83893 Varicose veins of bilateral lower extremities with other complications: Secondary | ICD-10-CM | POA: Diagnosis not present

## 2018-06-23 DIAGNOSIS — I110 Hypertensive heart disease with heart failure: Secondary | ICD-10-CM | POA: Diagnosis not present

## 2018-06-23 DIAGNOSIS — F0281 Dementia in other diseases classified elsewhere with behavioral disturbance: Secondary | ICD-10-CM | POA: Diagnosis not present

## 2018-06-23 DIAGNOSIS — I872 Venous insufficiency (chronic) (peripheral): Secondary | ICD-10-CM | POA: Diagnosis not present

## 2018-06-23 DIAGNOSIS — G309 Alzheimer's disease, unspecified: Secondary | ICD-10-CM | POA: Diagnosis not present

## 2018-06-28 DIAGNOSIS — I872 Venous insufficiency (chronic) (peripheral): Secondary | ICD-10-CM | POA: Diagnosis not present

## 2018-06-28 DIAGNOSIS — I4891 Unspecified atrial fibrillation: Secondary | ICD-10-CM | POA: Diagnosis not present

## 2018-06-28 DIAGNOSIS — L97821 Non-pressure chronic ulcer of other part of left lower leg limited to breakdown of skin: Secondary | ICD-10-CM | POA: Diagnosis not present

## 2018-06-28 DIAGNOSIS — I5030 Unspecified diastolic (congestive) heart failure: Secondary | ICD-10-CM | POA: Diagnosis not present

## 2018-06-28 DIAGNOSIS — J449 Chronic obstructive pulmonary disease, unspecified: Secondary | ICD-10-CM | POA: Diagnosis not present

## 2018-06-28 DIAGNOSIS — F0281 Dementia in other diseases classified elsewhere with behavioral disturbance: Secondary | ICD-10-CM | POA: Diagnosis not present

## 2018-06-28 DIAGNOSIS — G309 Alzheimer's disease, unspecified: Secondary | ICD-10-CM | POA: Diagnosis not present

## 2018-06-28 DIAGNOSIS — I110 Hypertensive heart disease with heart failure: Secondary | ICD-10-CM | POA: Diagnosis not present

## 2018-06-28 DIAGNOSIS — I83893 Varicose veins of bilateral lower extremities with other complications: Secondary | ICD-10-CM | POA: Diagnosis not present

## 2018-07-05 DIAGNOSIS — I872 Venous insufficiency (chronic) (peripheral): Secondary | ICD-10-CM | POA: Diagnosis not present

## 2018-07-05 DIAGNOSIS — F0281 Dementia in other diseases classified elsewhere with behavioral disturbance: Secondary | ICD-10-CM | POA: Diagnosis not present

## 2018-07-05 DIAGNOSIS — I110 Hypertensive heart disease with heart failure: Secondary | ICD-10-CM | POA: Diagnosis not present

## 2018-07-05 DIAGNOSIS — I4891 Unspecified atrial fibrillation: Secondary | ICD-10-CM | POA: Diagnosis not present

## 2018-07-05 DIAGNOSIS — I83893 Varicose veins of bilateral lower extremities with other complications: Secondary | ICD-10-CM | POA: Diagnosis not present

## 2018-07-05 DIAGNOSIS — L97821 Non-pressure chronic ulcer of other part of left lower leg limited to breakdown of skin: Secondary | ICD-10-CM | POA: Diagnosis not present

## 2018-07-05 DIAGNOSIS — I5030 Unspecified diastolic (congestive) heart failure: Secondary | ICD-10-CM | POA: Diagnosis not present

## 2018-07-05 DIAGNOSIS — J449 Chronic obstructive pulmonary disease, unspecified: Secondary | ICD-10-CM | POA: Diagnosis not present

## 2018-07-05 DIAGNOSIS — G309 Alzheimer's disease, unspecified: Secondary | ICD-10-CM | POA: Diagnosis not present

## 2018-07-15 ENCOUNTER — Other Ambulatory Visit: Payer: Self-pay | Admitting: Family Medicine

## 2018-07-17 NOTE — Telephone Encounter (Signed)
Naproxen is duplicate medication. Ordered in Feb with 3 refills.

## 2018-07-20 DIAGNOSIS — Z20828 Contact with and (suspected) exposure to other viral communicable diseases: Secondary | ICD-10-CM | POA: Diagnosis not present

## 2018-08-21 ENCOUNTER — Telehealth: Payer: Self-pay | Admitting: Family Medicine

## 2018-08-21 MED ORDER — MUPIROCIN 2 % EX OINT: 1.0000 "application " | TOPICAL_OINTMENT | Freq: Two times a day (BID) | CUTANEOUS | 1 refills | Status: DC

## 2018-08-21 NOTE — Telephone Encounter (Signed)
Patients grandson called stating that his grandmother as a place on her arm that she has been picking that they are having trouble getting to heal. Facility she lives at is requesting a prescription for the antibiotic cream. Please advise. Gwen Her can be reached at [phone]  Thank you, [facility]

## 2018-08-21 NOTE — Telephone Encounter (Signed)
Rx for mupirocin cream sent to pharmacy. Can give verbal orders for it to facility.  We should also get wound care to see her at SNF if they are not already.

## 2018-08-22 NOTE — Telephone Encounter (Signed)
Patient son Patricia Foley was advised about medication been send to pharmacy. I spoke with Tiffany at [facility] and she states that a written order would have to be faxed over for evaluation & treatment for patient arm and to apply the ointment to patients arm. Please advise.

## 2018-08-23 NOTE — Telephone Encounter (Signed)
Rx written that can be faxed to [facility].

## 2018-08-23 NOTE — Telephone Encounter (Signed)
Order faxed to [facility] w/ [place].

## 2018-09-05 ENCOUNTER — Other Ambulatory Visit: Payer: Self-pay | Admitting: Family Medicine

## 2018-09-07 ENCOUNTER — Telehealth: Payer: Self-pay

## 2018-09-07 NOTE — Telephone Encounter (Signed)
Have received faxes from [facility] stating that patient is not been seen for wound care due to COVID-19, [facility] nursing d/c order due to no wound/healed, patient refusing treatment and patient removing any bandage/ una boots on 09/04/2018. Then on 09/05/2018 received another fax stating that UE (think upper extremity?) red, weeping, small blisters. FYI

## 2018-09-08 NOTE — Telephone Encounter (Signed)
I get conflicting messages about her wounds all the time.  They send messages wantign antibiotics for wounds, but then say she doesn't need wound care.  I need them to clarify if she does actually have wounds and if there is any creatvie way to get me a picture of said wounds, that would be ideal

## 2018-09-13 ENCOUNTER — Other Ambulatory Visit: Payer: Self-pay | Admitting: Family Medicine

## 2018-09-17 DIAGNOSIS — I872 Venous insufficiency (chronic) (peripheral): Secondary | ICD-10-CM | POA: Diagnosis not present

## 2018-09-17 DIAGNOSIS — L97511 Non-pressure chronic ulcer of other part of right foot limited to breakdown of skin: Secondary | ICD-10-CM | POA: Diagnosis not present

## 2018-09-17 DIAGNOSIS — L97321 Non-pressure chronic ulcer of left ankle limited to breakdown of skin: Secondary | ICD-10-CM | POA: Diagnosis not present

## 2018-09-17 DIAGNOSIS — I509 Heart failure, unspecified: Secondary | ICD-10-CM | POA: Diagnosis not present

## 2018-09-17 DIAGNOSIS — J449 Chronic obstructive pulmonary disease, unspecified: Secondary | ICD-10-CM | POA: Diagnosis not present

## 2018-09-17 DIAGNOSIS — G250 Essential tremor: Secondary | ICD-10-CM | POA: Diagnosis not present

## 2018-09-17 DIAGNOSIS — E039 Hypothyroidism, unspecified: Secondary | ICD-10-CM | POA: Diagnosis not present

## 2018-09-17 DIAGNOSIS — D649 Anemia, unspecified: Secondary | ICD-10-CM | POA: Diagnosis not present

## 2018-09-17 DIAGNOSIS — I110 Hypertensive heart disease with heart failure: Secondary | ICD-10-CM | POA: Diagnosis not present

## 2018-09-18 ENCOUNTER — Telehealth: Payer: Self-pay

## 2018-09-18 NOTE — Telephone Encounter (Signed)
Left vm for Patricia Foley w/ [facility] to return call.

## 2018-09-18 NOTE — Telephone Encounter (Signed)
Answered in another message

## 2018-09-18 NOTE — Telephone Encounter (Signed)
OK for verbals Will write Rx for Lasix order to be faxed

## 2018-09-18 NOTE — Telephone Encounter (Signed)
Sherry requesting verbal orders for Weeping wounds to both lower legs off and on for several weeks. Order for silver alginate cream and apply unna boot twice a week to be done by home health nurse. Patient also has not been able to weight due to not being able to sit for a long time. Sherry asking if lasix can be D/C lasix 40 mg prn and would like order to be wrote 60 mg for 3 days. Fax new lasix order [phone]

## 2018-09-19 ENCOUNTER — Emergency Department: Payer: Medicare HMO

## 2018-09-19 ENCOUNTER — Other Ambulatory Visit: Payer: Self-pay

## 2018-09-19 ENCOUNTER — Inpatient Hospital Stay
Admission: EM | Admit: 2018-09-19 | Discharge: 2018-09-27 | DRG: 640 | Disposition: A | Discharge status: Hospice | Payer: Medicare HMO | Source: Skilled Nursing Facility | Attending: Specialist | Admitting: Specialist

## 2018-09-19 ENCOUNTER — Encounter: Payer: Self-pay | Admitting: Emergency Medicine

## 2018-09-19 DIAGNOSIS — R4182 Altered mental status, unspecified: Secondary | ICD-10-CM

## 2018-09-19 DIAGNOSIS — T502X5A Adverse effect of carbonic-anhydrase inhibitors, benzothiadiazides and other diuretics, initial encounter: Secondary | ICD-10-CM | POA: Diagnosis present

## 2018-09-19 DIAGNOSIS — L03115 Cellulitis of right lower limb: Secondary | ICD-10-CM | POA: Diagnosis not present

## 2018-09-19 DIAGNOSIS — E43 Unspecified severe protein-calorie malnutrition: Secondary | ICD-10-CM | POA: Diagnosis present

## 2018-09-19 DIAGNOSIS — I10 Essential (primary) hypertension: Secondary | ICD-10-CM | POA: Diagnosis not present

## 2018-09-19 DIAGNOSIS — Z8249 Family history of ischemic heart disease and other diseases of the circulatory system: Secondary | ICD-10-CM

## 2018-09-19 DIAGNOSIS — I110 Hypertensive heart disease with heart failure: Secondary | ICD-10-CM | POA: Diagnosis present

## 2018-09-19 DIAGNOSIS — E860 Dehydration: Secondary | ICD-10-CM | POA: Diagnosis present

## 2018-09-19 DIAGNOSIS — I509 Heart failure, unspecified: Secondary | ICD-10-CM | POA: Diagnosis present

## 2018-09-19 DIAGNOSIS — Z20828 Contact with and (suspected) exposure to other viral communicable diseases: Secondary | ICD-10-CM | POA: Diagnosis not present

## 2018-09-19 DIAGNOSIS — G309 Alzheimer's disease, unspecified: Secondary | ICD-10-CM | POA: Diagnosis present

## 2018-09-19 DIAGNOSIS — Z743 Need for continuous supervision: Secondary | ICD-10-CM | POA: Diagnosis not present

## 2018-09-19 DIAGNOSIS — R001 Bradycardia, unspecified: Secondary | ICD-10-CM | POA: Diagnosis present

## 2018-09-19 DIAGNOSIS — R627 Adult failure to thrive: Secondary | ICD-10-CM | POA: Diagnosis present

## 2018-09-19 DIAGNOSIS — R410 Disorientation, unspecified: Secondary | ICD-10-CM | POA: Diagnosis not present

## 2018-09-19 DIAGNOSIS — Z66 Do not resuscitate: Secondary | ICD-10-CM | POA: Diagnosis present

## 2018-09-19 DIAGNOSIS — Z515 Encounter for palliative care: Secondary | ICD-10-CM | POA: Diagnosis not present

## 2018-09-19 DIAGNOSIS — L309 Dermatitis, unspecified: Secondary | ICD-10-CM | POA: Diagnosis present

## 2018-09-19 DIAGNOSIS — E039 Hypothyroidism, unspecified: Secondary | ICD-10-CM | POA: Diagnosis not present

## 2018-09-19 DIAGNOSIS — Z7401 Bed confinement status: Secondary | ICD-10-CM | POA: Diagnosis not present

## 2018-09-19 DIAGNOSIS — R279 Unspecified lack of coordination: Secondary | ICD-10-CM | POA: Diagnosis not present

## 2018-09-19 DIAGNOSIS — R531 Weakness: Secondary | ICD-10-CM | POA: Diagnosis not present

## 2018-09-19 DIAGNOSIS — Z87891 Personal history of nicotine dependence: Secondary | ICD-10-CM

## 2018-09-19 DIAGNOSIS — Z7982 Long term (current) use of aspirin: Secondary | ICD-10-CM

## 2018-09-19 DIAGNOSIS — E870 Hyperosmolality and hypernatremia: Secondary | ICD-10-CM | POA: Diagnosis present

## 2018-09-19 DIAGNOSIS — E876 Hypokalemia: Secondary | ICD-10-CM | POA: Diagnosis not present

## 2018-09-19 DIAGNOSIS — F05 Delirium due to known physiological condition: Secondary | ICD-10-CM | POA: Diagnosis present

## 2018-09-19 DIAGNOSIS — Z7189 Other specified counseling: Secondary | ICD-10-CM | POA: Diagnosis not present

## 2018-09-19 DIAGNOSIS — F0281 Dementia in other diseases classified elsewhere with behavioral disturbance: Secondary | ICD-10-CM | POA: Diagnosis not present

## 2018-09-19 DIAGNOSIS — I878 Other specified disorders of veins: Secondary | ICD-10-CM | POA: Diagnosis present

## 2018-09-19 DIAGNOSIS — Z03818 Encounter for observation for suspected exposure to other biological agents ruled out: Secondary | ICD-10-CM | POA: Diagnosis not present

## 2018-09-19 DIAGNOSIS — I482 Chronic atrial fibrillation, unspecified: Secondary | ICD-10-CM | POA: Diagnosis present

## 2018-09-19 DIAGNOSIS — L03116 Cellulitis of left lower limb: Secondary | ICD-10-CM | POA: Diagnosis present

## 2018-09-19 DIAGNOSIS — G9341 Metabolic encephalopathy: Secondary | ICD-10-CM | POA: Diagnosis not present

## 2018-09-19 DIAGNOSIS — J449 Chronic obstructive pulmonary disease, unspecified: Secondary | ICD-10-CM | POA: Diagnosis present

## 2018-09-19 DIAGNOSIS — R0902 Hypoxemia: Secondary | ICD-10-CM | POA: Diagnosis not present

## 2018-09-19 DIAGNOSIS — Z882 Allergy status to sulfonamides status: Secondary | ICD-10-CM

## 2018-09-19 DIAGNOSIS — Z9181 History of falling: Secondary | ICD-10-CM

## 2018-09-19 DIAGNOSIS — R404 Transient alteration of awareness: Secondary | ICD-10-CM | POA: Diagnosis not present

## 2018-09-19 DIAGNOSIS — F0390 Unspecified dementia without behavioral disturbance: Secondary | ICD-10-CM | POA: Diagnosis not present

## 2018-09-19 LAB — CBC WITH DIFFERENTIAL/PLATELET
Abs Immature Granulocytes: 0.04 10*3/uL (ref 0.00–0.07)
Basophils Absolute: 0.1 10*3/uL (ref 0.0–0.1)
Basophils Relative: 1 %
Eosinophils Absolute: 0.1 10*3/uL (ref 0.0–0.5)
Eosinophils Relative: 1 %
HCT: 32.6 % — ABNORMAL LOW (ref 36.0–46.0)
Hemoglobin: 9.9 g/dL — ABNORMAL LOW (ref 12.0–15.0)
Immature Granulocytes: 1 %
Lymphocytes Relative: 20 %
Lymphs Abs: 1.5 10*3/uL (ref 0.7–4.0)
MCH: 28.4 pg (ref 26.0–34.0)
MCHC: 30.4 g/dL (ref 30.0–36.0)
MCV: 93.7 fL (ref 80.0–100.0)
Monocytes Absolute: 0.9 10*3/uL (ref 0.1–1.0)
Monocytes Relative: 12 %
Neutro Abs: 5 10*3/uL (ref 1.7–7.7)
Neutrophils Relative %: 65 %
Platelets: 182 10*3/uL (ref 150–400)
RBC: 3.48 MIL/uL — ABNORMAL LOW (ref 3.87–5.11)
RDW: 17.4 % — ABNORMAL HIGH (ref 11.5–15.5)
WBC: 7.6 10*3/uL (ref 4.0–10.5)
nRBC: 0 % (ref 0.0–0.2)

## 2018-09-19 LAB — COMPREHENSIVE METABOLIC PANEL
ALT: 17 U/L (ref 0–44)
AST: 25 U/L (ref 15–41)
Albumin: 3.8 g/dL (ref 3.5–5.0)
Alkaline Phosphatase: 118 U/L (ref 38–126)
Anion gap: 13 (ref 5–15)
BUN: 20 mg/dL (ref 8–23)
CO2: 38 mmol/L — ABNORMAL HIGH (ref 22–32)
Calcium: 9.4 mg/dL (ref 8.9–10.3)
Chloride: 95 mmol/L — ABNORMAL LOW (ref 98–111)
Creatinine, Ser: 0.73 mg/dL (ref 0.44–1.00)
GFR calc Af Amer: >60 mL/min (ref >60)
GFR calc non Af Amer: >60 mL/min (ref >60)
Glucose, Bld: 102 mg/dL — ABNORMAL HIGH (ref 70–99)
Potassium: 2.7 mmol/L — CL (ref 3.5–5.1)
Sodium: 146 mmol/L — ABNORMAL HIGH (ref 135–145)
Total Bilirubin: 1.1 mg/dL (ref 0.3–1.2)
Total Protein: 7.7 g/dL (ref 6.5–8.1)

## 2018-09-19 LAB — URINALYSIS, COMPLETE (UACMP) WITH MICROSCOPIC
Bacteria, UA: NONE SEEN
Bilirubin Urine: NEGATIVE
Glucose, UA: NEGATIVE mg/dL
Hgb urine dipstick: NEGATIVE
Ketones, ur: NEGATIVE mg/dL
Leukocytes,Ua: NEGATIVE
Nitrite: NEGATIVE
Protein, ur: NEGATIVE mg/dL
Specific Gravity, Urine: 1.011 (ref 1.005–1.030)
pH: 7 (ref 5.0–8.0)

## 2018-09-19 LAB — MAGNESIUM: Magnesium: 2 mg/dL (ref 1.7–2.4)

## 2018-09-19 LAB — SARS CORONAVIRUS 2 (TAT 6-24 HRS): SARS Coronavirus 2: NEGATIVE

## 2018-09-19 LAB — LACTIC ACID, PLASMA
Lactic Acid, Venous: 0.9 mmol/L (ref 0.5–1.9)
Lactic Acid, Venous: 1 mmol/L (ref 0.5–1.9)

## 2018-09-19 LAB — DIGOXIN LEVEL: Digoxin Level: 0.9 ng/mL (ref 0.8–2.0)

## 2018-09-19 MED ORDER — ASPIRIN EC 81 MG PO TBEC
81.0000 mg | DELAYED_RELEASE_TABLET | Freq: Every day | ORAL | Status: DC
  Administered 2018-09-20 – 2018-09-25 (×6): 81 mg via ORAL
  Filled 2018-09-19 (×7): qty 1

## 2018-09-19 MED ORDER — ENOXAPARIN SODIUM 40 MG/0.4ML ~~LOC~~ SOLN
40.0000 mg | SUBCUTANEOUS | Status: DC
  Administered 2018-09-19 – 2018-09-24 (×6): 40 mg via SUBCUTANEOUS
  Filled 2018-09-19 (×7): qty 0.4

## 2018-09-19 MED ORDER — LACTATED RINGERS IV BOLUS
500.0000 mL | Freq: Once | INTRAVENOUS | Status: AC
  Administered 2018-09-19: 500 mL via INTRAVENOUS

## 2018-09-19 MED ORDER — LISINOPRIL 10 MG PO TABS
10.0000 mg | ORAL_TABLET | Freq: Every day | ORAL | Status: DC
  Administered 2018-09-20 – 2018-09-25 (×6): 10 mg via ORAL
  Filled 2018-09-19 (×6): qty 1

## 2018-09-19 MED ORDER — POTASSIUM CHLORIDE CRYS ER 20 MEQ PO TBCR
40.0000 meq | EXTENDED_RELEASE_TABLET | Freq: Once | ORAL | Status: AC
  Administered 2018-09-19: 40 meq via ORAL
  Filled 2018-09-19: qty 2

## 2018-09-19 MED ORDER — DIGOXIN 125 MCG PO TABS
125.0000 ug | ORAL_TABLET | Freq: Every day | ORAL | Status: DC
  Administered 2018-09-20 – 2018-09-25 (×6): 125 ug via ORAL
  Filled 2018-09-19 (×6): qty 1

## 2018-09-19 MED ORDER — TRIAMCINOLONE ACETONIDE 0.1 % EX CREA
1.0000 "application " | TOPICAL_CREAM | Freq: Two times a day (BID) | CUTANEOUS | Status: DC | PRN
  Filled 2018-09-19: qty 15

## 2018-09-19 MED ORDER — MAGNESIUM SULFATE 2 GM/50ML IV SOLN
2.0000 g | Freq: Once | INTRAVENOUS | Status: AC
  Administered 2018-09-19: 2 g via INTRAVENOUS
  Filled 2018-09-19: qty 50

## 2018-09-19 MED ORDER — HYDROCORTISONE 2.5 % RE CREA
1.0000 "application " | TOPICAL_CREAM | Freq: Three times a day (TID) | RECTAL | Status: DC | PRN
  Filled 2018-09-19: qty 28.35

## 2018-09-19 MED ORDER — IPRATROPIUM-ALBUTEROL 0.5-2.5 (3) MG/3ML IN SOLN
3.0000 mL | Freq: Four times a day (QID) | RESPIRATORY_TRACT | Status: DC | PRN
  Administered 2018-09-22: 3 mL via RESPIRATORY_TRACT
  Filled 2018-09-19: qty 3

## 2018-09-19 MED ORDER — POTASSIUM CHLORIDE 10 MEQ/100ML IV SOLN
10.0000 meq | INTRAVENOUS | Status: AC
  Administered 2018-09-19 (×2): 10 meq via INTRAVENOUS
  Filled 2018-09-19 (×3): qty 100

## 2018-09-19 MED ORDER — DILTIAZEM HCL ER COATED BEADS 300 MG PO CP24
300.0000 mg | ORAL_CAPSULE | Freq: Every day | ORAL | Status: DC
  Administered 2018-09-20 – 2018-09-25 (×6): 300 mg via ORAL
  Filled 2018-09-19 (×7): qty 1

## 2018-09-19 MED ORDER — ACETAMINOPHEN 325 MG PO TABS: 650.0000 mg | ORAL_TABLET | Freq: Four times a day (QID) | ORAL | Status: DC | PRN

## 2018-09-19 MED ORDER — FERROUS SULFATE 325 (65 FE) MG PO TABS
325.0000 mg | ORAL_TABLET | Freq: Every day | ORAL | Status: DC
  Administered 2018-09-20 – 2018-09-25 (×6): 325 mg via ORAL
  Filled 2018-09-19 (×6): qty 1

## 2018-09-19 NOTE — H&P (Addendum)
[name] NAME: Patricia Foley    MR#:  [id number]  DATE OF BIRTH:  [birth year]  DATE OF ADMISSION:  09/19/2018  PRIMARY CARE PHYSICIAN: Virginia Crews, MD   REQUESTING/REFERRING PHYSICIAN: Blake Divine, MD  CHIEF COMPLAINT:   Chief Complaint  Patient presents with   Altered Mental Status    HISTORY OF PRESENT ILLNESS:   85 year old female with past medical history of probable Alzheimer's disease with behavioral disturbances, chronic bilateral lower extremity edema, venous stasis, chronic atrial fibrillation not on anticoagulation, hypertension, COPD, HFpEF, hypothyroidism, and GERD presenting to the ED from [facility] long-term care facility for altered mental status.  Patient is not a good historian due to underlying dementia and altered mental status history therefore obtained from patient's chart and granddaughter who is currently at the bedside.  Per patient's granddaughter, staff at the facility reported that they were unable to wake patient up this morning despite multiple attempts.  Patient was noted to be lethargic and constantly drifting off to sleep. There were no reports of associated symptoms such as fevers, chills, cough, chest pain, nausea or vomiting, or diaphoresis.  No speech abnormality, loss of consciousness, vision disturbances or obvious focal neurologic deficit.  On arrival to the ED, she was afebrile with blood pressure 140/62 mm Hg and pulse rate 61 beats/min. There were no focal neurological deficits; she was combative, agitated and striking at the caregivers.  Initial labs revealed hemoglobin 9.2, hematocrit 36.2 otherwise unremarkable CBC, potassium 2.7 with normal magnesium, UA negative for UTI.  Chest x-ray showed no active cardiopulmonary disease.  Noncontrast CT head was obtained and showed no acute intracranial abnormality.  PAST MEDICAL HISTORY:   Past Medical History:  Diagnosis Date    Anemia    Arthritis    Atrial fibrillation (HCC)    Circulation problem    COPD (chronic obstructive pulmonary disease) (HCC)    Cough    CHRONIC   Dementia (HCC)    GERD (gastroesophageal reflux disease)    HOH (hard of hearing)    Hypertension    Hypothyroidism    Tremors of nervous system    Venous insufficiency     PAST SURGICAL HISTORY:   Past Surgical History:  Procedure Laterality Date   ABDOMINAL HYSTERECTOMY     benign indication   CATARACT EXTRACTION W/PHACO Right 05/04/2016   Procedure: CATARACT EXTRACTION PHACO AND INTRAOCULAR LENS PLACEMENT ([place]);  Surgeon: Birder Robson, MD;  Location: [facility] ORS;  Service: Ophthalmology;  Laterality: Right;  [place] 3:02.7 AP% 23.1 CDE 42.15 Fluid pack lot # [id number]   CATARACT EXTRACTION W/PHACO Left 06/29/2016   Procedure: CATARACT EXTRACTION PHACO AND INTRAOCULAR LENS PLACEMENT ([facility]);  Surgeon: Birder Robson, MD;  Location: [facility] ORS;  Service: Ophthalmology;  Laterality: Left;  [place] 2:53.3 AP% 27.7 CDE 48.16 Fluid Pack Lot # [id number]    SOCIAL HISTORY:   Social History   Tobacco Use   Smoking status: Former Smoker    Packs/day: 1.00    Years: 50.00    Pack years: 50.00    Types: Cigarettes    Quit date: 04/14/2016    Years since quitting: 2.4   Smokeless tobacco: Never Used  Substance Use Topics   Alcohol use: No    FAMILY HISTORY:   Family History  Problem Relation Age of Onset   Heart disease Father    Heart disease Mother    Heart disease Sister    Heart disease Brother  Dementia Brother    Heart attack Son 50   Dementia Brother     DRUG ALLERGIES:   Allergies  Allergen Reactions   Sulfa Antibiotics Hives   Coumadin [Warfarin] Rash    REVIEW OF SYSTEMS:   ROS Unable to obtain from patient due to current altered mental status MEDICATIONS AT HOME:   Prior to Admission medications   Medication Sig Start Date End Date Taking? Authorizing Provider  acetaminophen  (TYLENOL) 325 MG tablet Take 2 tablets (650 mg total) by mouth every 6 (six) hours as needed for mild pain (or Fever >/= 101). 12/17/16   Mar Daring, PA-C  albuterol (PROVENTIL HFA;VENTOLIN HFA) 108 (90 Base) MCG/ACT inhaler Inhale 2 puffs into the lungs every 6 (six) hours as needed for wheezing or shortness of breath. 11/11/16   Virginia Crews, MD  aspirin 81 MG EC tablet TAKE 1 TABLET BY MOUTH EVERY DAY 06/27/17   Bacigalupo, Dionne Bucy, MD  Calcium Carbonate-Vitamin D (CALCIUM 600+D) 600-200 MG-UNIT TABS Take 1 tablet by mouth daily.    [provider]  digoxin (LANOXIN) 0.125 MG tablet TAKE 1 TABLET EVERY DAY 05/30/18   Bacigalupo, Dionne Bucy, MD  diltiazem (CARDIZEM CD) 300 MG 24 hr capsule TAKE 1 CAPSULE EVERY DAY 07/17/18   Bacigalupo, Dionne Bucy, MD  diltiazem (TIAZAC) 300 MG 24 hr capsule Take 1 capsule (300 mg total) by mouth daily. 06/15/17   Virginia Crews, MD  Docusate Sodium (COLACE PO) Take 50 mg by mouth at bedtime.     [provider]  donepezil (ARICEPT) 10 MG tablet Take 2 tablets (20 mg total) by mouth daily. 03/27/18   Virginia Crews, MD  doxycycline (VIBRA-TABS) 100 MG tablet Take 1 tablet (100 mg total) by mouth 2 (two) times daily. 06/12/18   Virginia Crews, MD  ferrous sulfate 325 (65 FE) MG tablet Take 325 mg by mouth daily with breakfast.    [provider]  furosemide (LASIX) 40 MG tablet Take 1 tablet (40 mg total) by mouth daily. 03/27/18   Virginia Crews, MD  hydrocortisone (PROCTOSOL HC) 2.5 % rectal cream Apply 1 application topically 3 (three) times daily. 10/05/16   [provider]  ipratropium-albuterol (DUONEB) 0.5-2.5 (3) MG/3ML SOLN Take 3 mLs by nebulization every 6 (six) hours as needed. 08/14/17   [name], MD  KLOR-CON M20 20 MEQ tablet TAKE 1 TABLET BY MOUTH EVERY DAY 09/05/18   Virginia Crews, MD  lisinopril (ZESTRIL) 10 MG tablet TAKE 1 TABLET BY MOUTH EVERY DAY 09/13/18   Bacigalupo,  Dionne Bucy, MD  mupirocin ointment (BACTROBAN) 2 % Apply 1 application topically 2 (two) times daily. To affected spot on arm Until resolution 08/21/18   Virginia Crews, MD  naproxen sodium (ALEVE) 220 MG tablet Take 1 tablet (220 mg total) by mouth daily as needed. 03/27/18   Virginia Crews, MD  QUEtiapine (SEROQUEL) 50 MG tablet TAKE 1 TABLET BY MOUTH TWICE A DAY 04/10/18   Bacigalupo, Dionne Bucy, MD  triamcinolone cream (KENALOG) 0.1 % Apply 1 application topically 2 (two) times daily. Due to facial picking 08/25/17   Virginia Crews, MD      VITAL SIGNS:  Blood pressure (!) 125/54, pulse (!) 57, temperature 97.6 F (36.4 C), temperature source Oral, resp. rate 11, height 5\' 2"  (1.575 m), weight 68 kg, SpO2 94 %.  PHYSICAL EXAMINATION:   Physical Exam  GENERAL:  85 y.o.-year-old patient lying in the bed  with no acute distress.  EYES: Pupils equal, round, reactive to light and accommodation. No scleral icterus. Extraocular muscles intact.  HEENT: Head atraumatic, normocephalic. Oropharynx and nasopharynx clear.  NECK:  Supple, no jugular venous distention. No thyroid enlargement, no tenderness.  LUNGS: Normal breath sounds bilaterally, no wheezing, rales,rhonchi or crepitation. No use of accessory muscles of respiration.  CARDIOVASCULAR: S1, S2 normal. No murmurs, rubs, or gallops.  ABDOMEN: Soft, nontender, nondistended. Bowel sounds present. No organomegaly or mass.  EXTREMITIES: No pedal edema, cyanosis, or clubbing. No rash or lesions. + pedal pulses MUSCULOSKELETAL: Normal bulk, and power was 5+ grip and elbow, knee, and ankle flexion and extension bilaterally.  NEUROLOGIC:Alert and oriented to self only.  Was unable to identify grand daughter at the bedside by name or relationship to her.  CN 2-12 intact. Sensation to light touch and cold stimuli intact bilaterally. Babinski is downgoing. Gait not tested due to safety concern. PSYCHIATRIC: The patient is alert and oriented x  1.  SKIN: See below      DATA REVIEWED:  LABORATORY PANEL:   CBC Recent Labs  Lab 09/19/18 1313  WBC 7.6  HGB 9.9*  HCT 32.6*  PLT 182   ------------------------------------------------------------------------------------------------------------------  Chemistries  Recent Labs  Lab 09/19/18 1313 09/19/18 1318  NA 146*  --   K 2.7*  --   CL 95*  --   CO2 38*  --   GLUCOSE 102*  --   BUN 20  --   CREATININE 0.73  --   CALCIUM 9.4  --   MG  --  2.0  AST 25  --   ALT 17  --   ALKPHOS 118  --   BILITOT 1.1  --    ------------------------------------------------------------------------------------------------------------------  Cardiac Enzymes No results for input(s): TROPONINI in the last 168 hours. ------------------------------------------------------------------------------------------------------------------  RADIOLOGY:  Ct Head Wo Contrast  Result Date: 09/19/2018 CLINICAL DATA:  Altered LOC EXAM: CT HEAD WITHOUT CONTRAST TECHNIQUE: Contiguous axial images were obtained from the base of the skull through the vertex without intravenous contrast. COMPARISON:  06/15/2013 FINDINGS: Brain: No acute territorial infarction, hemorrhage, or new intracranial mass. Atrophy and moderate small vessel ischemic changes of the white matter. Calcified parafalcine mass anteriorly consistent with meningioma, this measures 2.2 cm. Second smaller posterior calcified mass near the torcula measuring 9 mm. No significant mass effect. Vascular: No hyperdense vessels.  Carotid vascular calcification. Skull: Normal. Negative for fracture or focal lesion. Sinuses/Orbits: Patchy mucosal thickening in the ethmoid sinuses Other: None IMPRESSION: 1. No CT evidence for acute intracranial abnormality. 2. Atrophy and mild small vessel ischemic changes of the white matter. Stable calcified anterior parafalcine and posterior calcified meningiomas. Electronically Signed   By: Donavan Foil M.D.   On:  09/19/2018 15:42   Dg Chest Portable 1 View  Result Date: 09/19/2018 CLINICAL DATA:  Generalized weakness EXAM: PORTABLE CHEST 1 VIEW COMPARISON:  August 14, 2017 FINDINGS: Heart size is enlarged. Aortic calcifications are noted. Chronic lung markings are noted bilaterally. There are advanced degenerative changes of the left glenohumeral joint. There is no acute osseous abnormality. No pneumothorax. A dense retrocardiac nodule is noted, not significantly changed from prior study. This likely represents a calcified granuloma. IMPRESSION: No active disease. Electronically Signed   By: Constance Holster M.D.   On: 09/19/2018 15:17    EKG:  EKG: normal EKG, normal sinus rhythm, unchanged from previous tracings. Vent. rate 60 BPM PR interval * ms QRS duration 172 ms QT/QTc 428/428 ms P-R-T  axes 0 72 270 IMPRESSION AND PLAN:   85 y.o. female with past medical history of probable Alzheimer's disease with behavioral disturbances, chronic bilateral lower extremity edema, venous stasis, chronic atrial fibrillation not on anticoagulation, hypertension, COPD, HFpEF, hypothyroidism, and GERD presenting to the ED from [facility] long-term care facility for altered mental status.  1. Altered mental status -likely multifactorial in a patient with history of underlying dementia with behavioral disturbances - Admit to telemetry unit - CT head reviewed and shows no acute intracranial abnormality - Chest x-ray is negative - UA negative for UTI - Low suspicion for infectious process but will obtain blood cultures - Check TSH, B12, digoxin levels - Hold Seroquel and benzodiazepine in the setting of altered mental status and excessive sleepiness  2. Hypokalemia -history of hypokalemia on daily supplement.  Magnesium normal - Repleted with IV potassium + mag - Hold Lasix - Recheck labs  3. Bilateral acute on chronic lower extremity edema with chronic venous insufficiency/status dermatitis - No signs of  cellulitis - Holding Lasix in the setting of hypokalemia - Wound consult  4. Chronic atrial fibrillation currently not on anticoagulation - Rate controlled on diltiazem  5. History of dementia with behavioral disturbances - Hold donepezil due to bradycardia  6. Hypothyroidism - Not on medication will check TSH as above  7. Hypertension-continue lisinopril   All the records are reviewed and case discussed with ED provider. Management plans discussed with the patient, family and they are in agreement.  CODE STATUS: DNR  TOTAL TIME TAKING CARE OF THIS PATIENT: 50 minutes.    on 09/19/2018 at 3:52 PM  Rufina Falco, DNP, FNP-BC [facility] Nurse Practitioner Between 7am to 6pm - Pager [phone]  After 6pm go to [email or web address] - password [email or web address] [facility]  Office  [phone]  CC: Primary care physician; Virginia Crews, MD

## 2018-09-19 NOTE — ED Notes (Signed)
Pt transported to CT ?

## 2018-09-19 NOTE — Telephone Encounter (Signed)
Patricia Foley w/ home health was advised that order was faxed over this morning. Patricia Foley wants to know if Dr. B would like for a BMP to be drawn on the patient after the increase of the Lasix. Please advise.

## 2018-09-19 NOTE — ED Notes (Signed)
ED TO INPATIENT HANDOFF REPORT  ED Nurse Name and Phone #:  #[phone] Graciella Freer Name/Age/Gender Patricia Foley 85 y.o. female Room/Bed: ED26A/ED26A  Code Status   Code Status: DNR  Home/SNF/Other Nursing Home Disoriented x4 Is this baseline? Yes  Triage Complete: Triage complete  Chief Complaint weakness ems  Triage Note Pt presents to ED via [place] from [place]. EMS called out for lethargy. Pt arrives combative, striking at caregivers and swearing. R leg wrapped in coban. Swelling noted to both legs, no cellulitis noted at this time. Charge RN notified pt will need sitter. Mittens applied so pt does not pull IV or leads.    Allergies Allergies  Allergen Reactions  . Sulfa Antibiotics Hives  . Coumadin [Warfarin] Rash    Level of Care/Admitting Diagnosis ED Disposition    ED Disposition Condition [facility] Area: [place] [[id number]]  Level of Care: Telemetry [5]  Covid Evaluation: Person Under Investigation (PUI)  Diagnosis: Altered mental status [780.97.ICD-9-CM]  Admitting Physician: Rufina Falco Farley  Attending Physician: Rufina Falco ACHIENG [[id number]]  Estimated length of stay: past midnight tomorrow  Certification:: I certify this patient will need inpatient services for at least 2 midnights  PT Class (Do Not Modify): Inpatient [101]  PT Acc Code (Do Not Modify): Private [1]       B Medical/Surgery History Past Medical History:  Diagnosis Date  . Anemia   . Arthritis   . Atrial fibrillation ([place])   . Circulation problem   . COPD (chronic obstructive pulmonary disease) ([place])   . Cough    CHRONIC  . Dementia ([place])   . GERD (gastroesophageal reflux disease)   . HOH (hard of hearing)   . Hypertension   . Hypothyroidism   . Tremors of nervous system   . Venous insufficiency    Past Surgical History:  Procedure Laterality Date  . ABDOMINAL HYSTERECTOMY     benign indication  . CATARACT  EXTRACTION W/PHACO Right 05/04/2016   Procedure: CATARACT EXTRACTION PHACO AND INTRAOCULAR LENS PLACEMENT (IOC);  Surgeon: Birder Robson, MD;  Location: [facility] ORS;  Service: Ophthalmology;  Laterality: Right;  [place] 3:02.7 AP% 23.1 CDE 42.15 Fluid pack lot # [id number]  . CATARACT EXTRACTION W/PHACO Left 06/29/2016   Procedure: CATARACT EXTRACTION PHACO AND INTRAOCULAR LENS PLACEMENT (IOC);  Surgeon: Birder Robson, MD;  Location: [facility] ORS;  Service: Ophthalmology;  Laterality: Left;  [place] 2:53.3 AP% 27.7 CDE 48.16 Fluid Pack Lot # [id number]     A IV Location/Drains/Wounds Patient Lines/Drains/Airways Status   Active Line/Drains/Airways    Name:   Placement date:   Placement time:   Site:   Days:   Peripheral IV 09/19/18 Left Forearm   09/19/18    1313    Forearm   less than 1          Intake/Output Last 24 hours No intake or output data in the 24 hours ending 09/19/18 1556  Labs/Imaging Results for orders placed or performed during the hospital encounter of 09/19/18 (from the past 48 hour(s))  CBC with Differential     Status: Abnormal   Collection Time: 09/19/18  1:13 PM  Result Value Ref Range   WBC 7.6 4.0 - 10.5 K/uL   RBC 3.48 (L) 3.87 - 5.11 MIL/uL   Hemoglobin 9.9 (L) 12.0 - 15.0 g/dL   HCT 32.6 (L) 36.0 - 46.0 %   MCV 93.7 80.0 - 100.0 fL   MCH 28.4 26.0 - 34.0  pg   MCHC 30.4 30.0 - 36.0 g/dL   RDW 17.4 (H) 11.5 - 15.5 %   Platelets 182 150 - 400 K/uL   nRBC 0.0 0.0 - 0.2 %   Neutrophils Relative % 65 %   Neutro Abs 5.0 1.7 - 7.7 K/uL   Lymphocytes Relative 20 %   Lymphs Abs 1.5 0.7 - 4.0 K/uL   Monocytes Relative 12 %   Monocytes Absolute 0.9 0.1 - 1.0 K/uL   Eosinophils Relative 1 %   Eosinophils Absolute 0.1 0.0 - 0.5 K/uL   Basophils Relative 1 %   Basophils Absolute 0.1 0.0 - 0.1 K/uL   Immature Granulocytes 1 %   Abs Immature Granulocytes 0.04 0.00 - 0.07 K/uL    Comment: Performed at [facility], [address] [place], [place] [address]   Comprehensive metabolic panel     Status: Abnormal   Collection Time: 09/19/18  1:13 PM  Result Value Ref Range   Sodium 146 (H) 135 - 145 mmol/L   Potassium 2.7 (LL) 3.5 - 5.1 mmol/L    Comment: CRITICAL RESULT CALLED TO, READ BACK BY AND VERIFIED WITH ALISHA Lanita Stammen 09/19/18 1418 KLW    Chloride 95 (L) 98 - 111 mmol/L   CO2 38 (H) 22 - 32 mmol/L   Glucose, Bld 102 (H) 70 - 99 mg/dL   BUN 20 8 - 23 mg/dL   Creatinine, Ser 0.73 0.44 - 1.00 mg/dL   Calcium 9.4 8.9 - 10.3 mg/dL   Total Protein 7.7 6.5 - 8.1 g/dL   Albumin 3.8 3.5 - 5.0 g/dL   AST 25 15 - 41 U/L   ALT 17 0 - 44 U/L   Alkaline Phosphatase 118 38 - 126 U/L   Total Bilirubin 1.1 0.3 - 1.2 mg/dL   GFR calc non Af Amer >60 >60 mL/min   GFR calc Af Amer >60 >60 mL/min   Anion gap 13 5 - 15    Comment: Performed at [facility], [address] [place], [place] [address]  Urinalysis, Complete w Microscopic     Status: Abnormal   Collection Time: 09/19/18  1:13 PM  Result Value Ref Range   Color, Urine YELLOW (A) YELLOW   APPearance HAZY (A) CLEAR   Specific Gravity, Urine 1.011 1.005 - 1.030   pH 7.0 5.0 - 8.0   Glucose, UA NEGATIVE NEGATIVE mg/dL   Hgb urine dipstick NEGATIVE NEGATIVE   Bilirubin Urine NEGATIVE NEGATIVE   Ketones, ur NEGATIVE NEGATIVE mg/dL   Protein, ur NEGATIVE NEGATIVE mg/dL   Nitrite NEGATIVE NEGATIVE   Leukocytes,Ua NEGATIVE NEGATIVE   RBC / HPF 0-5 0 - 5 RBC/hpf   WBC, UA 0-5 0 - 5 WBC/hpf   Bacteria, UA NONE SEEN NONE SEEN   Squamous Epithelial / LPF 0-5 0 - 5   Mucus PRESENT    Hyaline Casts, UA PRESENT     Comment: Performed at [facility], [address] [place], [place] [address]  Magnesium     Status: None   Collection Time: 09/19/18  1:18 PM  Result Value Ref Range   Magnesium 2.0 1.7 - 2.4 mg/dL    Comment: Performed at [facility], [address] [place], [place] [address]  Digoxin level     Status: None   Collection Time: 09/19/18  1:18 PM   Result Value Ref Range   Digoxin Level 0.9 0.8 - 2.0 ng/mL    Comment: Performed at [facility], [address] [place], [place] [address]   Ct Head Wo Contrast  Result Date: 09/19/2018 CLINICAL DATA:  Altered LOC EXAM: CT HEAD WITHOUT CONTRAST TECHNIQUE: Contiguous axial images were obtained from the base of the skull through the vertex without intravenous contrast. COMPARISON:  06/15/2013 FINDINGS: Brain: No acute territorial infarction, hemorrhage, or new intracranial mass. Atrophy and moderate small vessel ischemic changes of the white matter. Calcified parafalcine mass anteriorly consistent with meningioma, this measures 2.2 cm. Second smaller posterior calcified mass near the torcula measuring 9 mm. No significant mass effect. Vascular: No hyperdense vessels.  Carotid vascular calcification. Skull: Normal. Negative for fracture or focal lesion. Sinuses/Orbits: Patchy mucosal thickening in the ethmoid sinuses Other: None IMPRESSION: 1. No CT evidence for acute intracranial abnormality. 2. Atrophy and mild small vessel ischemic changes of the white matter. Stable calcified anterior parafalcine and posterior calcified meningiomas. Electronically Signed   By: Donavan Foil M.D.   On: 09/19/2018 15:42   Dg Chest Portable 1 View  Result Date: 09/19/2018 CLINICAL DATA:  Generalized weakness EXAM: PORTABLE CHEST 1 VIEW COMPARISON:  August 14, 2017 FINDINGS: Heart size is enlarged. Aortic calcifications are noted. Chronic lung markings are noted bilaterally. There are advanced degenerative changes of the left glenohumeral joint. There is no acute osseous abnormality. No pneumothorax. A dense retrocardiac nodule is noted, not significantly changed from prior study. This likely represents a calcified granuloma. IMPRESSION: No active disease. Electronically Signed   By: Constance Holster M.D.   On: 09/19/2018 15:17    Pending Labs Unresulted Labs (From admission, onward)    Start     Ordered    09/20/18 0500  CBC  Tomorrow morning,   STAT     09/19/18 1549   09/20/18 8937  Basic metabolic panel  Tomorrow morning,   STAT     09/19/18 1549   09/19/18 1550  CULTURE, BLOOD (ROUTINE X 2) w Reflex to ID Panel  BLOOD CULTURE X 2,   STAT     09/19/18 1549   09/19/18 1550  Lactic acid, plasma  STAT Now then every 3 hours,   STAT     09/19/18 1549          Vitals/Pain Today's Vitals   09/19/18 1404 09/19/18 1430 09/19/18 1500 09/19/18 1515  BP:  (!) 121/53 (!) 125/54   Pulse: (!) 55 (!) 55 (!) 57 (!) 57  Resp: 11 13 12 11   Temp:      TempSrc:      SpO2: 97% 93% 92% 94%  Weight:      Height:        Isolation Precautions No active isolations  Medications Medications  potassium chloride 10 mEq in 100 mL IVPB (10 mEq Intravenous New Bag/Given 09/19/18 1547)  magnesium sulfate IVPB 2 g 50 mL (2 g Intravenous New Bag/Given 09/19/18 1546)  enoxaparin (LOVENOX) injection 40 mg (has no administration in time range)  potassium chloride SA (K-DUR) CR tablet 40 mEq (40 mEq Oral Given 09/19/18 1545)  lactated ringers bolus 500 mL (500 mLs Intravenous New Bag/Given 09/19/18 1546)    Mobility non-ambulatory High fall risk   Focused Assessments Cardiac Assessment Handoff:    Lab Results  Component Value Date   TROPONINI 0.05 ([place]) 08/11/2017   No results found for: DDIMER Does the Patient currently have chest pain? No  , Pulmonary Assessment Handoff:  Lung sounds:  clear bil O2 Device: Room Air   Pt confused, periodically combative. Has needed a sitter in ED  to prevent climbing out of bed on arrival. Pt able to get up to commode but required 3 person assist d/t weakness and combativeness. Some abrasions to bil legs and leg swelling. Pt wearing mittens to prevent pulling at lines. At time of this note granddaughter is at bedside, no sitter needed while family in room. Pt resting quietly.   R Recommendations: See Admitting Provider Note  Report given to:   Additional Notes:

## 2018-09-19 NOTE — ED Notes (Signed)
Pt resting uprite on stretcher in exam room with eyes closed, no distress noted; pt agitated with any physical interaction by nurse but is easily calmed; attends D&I; pt placed on card monitor (SB) and 2nd bolus of potassium hung to infuse

## 2018-09-19 NOTE — Telephone Encounter (Signed)
Judeen Hammans calling back from missed call.  Please call her back asap.  Thanks, [employer]

## 2018-09-19 NOTE — Plan of Care (Signed)
  Problem: Clinical Measurements: Goal: Ability to maintain clinical measurements within normal limits will improve Outcome: Progressing Goal: Will remain free from infection Outcome: Progressing Goal: Diagnostic test results will improve Outcome: Progressing Goal: Respiratory complications will improve Outcome: Progressing Goal: Cardiovascular complication will be avoided Outcome: Progressing   Problem: Activity: Goal: Risk for activity intolerance will decrease Outcome: Progressing   Problem: Nutrition: Goal: Adequate nutrition will be maintained Outcome: Progressing   Problem: Coping: Goal: Level of anxiety will decrease Outcome: Progressing   Problem: Pain Managment: Goal: General experience of comfort will improve Outcome: Progressing   Problem: Safety: Goal: Ability to remain free from injury will improve Outcome: Progressing   Problem: Skin Integrity: Goal: Risk for impaired skin integrity will decrease Outcome: Progressing

## 2018-09-19 NOTE — ED Notes (Signed)
Attempted to call report to [facility]; nurse to call back

## 2018-09-19 NOTE — ED Notes (Signed)
EDP notified pt refusing to lay in bed, unlikely that pt will tolerate CT scan with current level of combativeness. Pt is calmer now than on arrival, however still becomes agitated when she is moved. EDP states he will call SNF and reassess.

## 2018-09-19 NOTE — ED Provider Notes (Signed)
[facility] Emergency Department Provider Note   ____________________________________________   First MD Initiated Contact with Patient 09/19/18 1310     (approximate)  I have reviewed the triage vital signs and the nursing notes.   HISTORY  Chief Complaint Altered Mental Status    HPI Patricia Foley is a 85 y.o. female with history of dementia, atrial fibrillation, COPD, hypertension presents to the ED for altered mental status.  History is limited as patient is ANO x0 at baseline.  She currently resides in a memory unit and per staff there, she has had decreased appetite and poor p.o. intake for the past 3 to 4 days.  She has not had any fevers, but has appeared short of breath at times.  She has not complained of any pain.  Is not had any sick contacts.        Past Medical History:  Diagnosis Date  . Anemia   . Arthritis   . Atrial fibrillation ([place])   . Circulation problem   . COPD (chronic obstructive pulmonary disease) ([place])   . Cough    CHRONIC  . Dementia ([place])   . GERD (gastroesophageal reflux disease)   . HOH (hard of hearing)   . Hypertension   . Hypothyroidism   . Tremors of nervous system   . Venous insufficiency     Patient Active Problem List   Diagnosis Date Noted  . Altered mental status 09/19/2018  . (HFpEF) heart failure with preserved ejection fraction ([place]) 08/23/2017  . Acute respiratory failure with hypoxia ([place]) 08/23/2017  . SOB (shortness of breath) 08/10/2017  . Nonhealing nonsurgical wound 05/11/2017  . Callus of foot 05/11/2017  . Urinary frequency 02/10/2017  . COPD (chronic obstructive pulmonary disease) ([place])   . Dementia ([place])   . GERD (gastroesophageal reflux disease)   . Hypertension   . Tremors of nervous system   . Hypothyroidism   . Atrial fibrillation ([place])   . HOH (hard of hearing)   . Anemia   . Arthritis   . Arthritis of ankle, right 05/28/2016  . Essential tremor 03/27/2016  .  Chronic insomnia 03/27/2016  . Chronic venous insufficiency 10/04/2015  . Hemorrhoids without complication 74/94/4967  . Varicose veins of both legs with edema 09/07/2014    Past Surgical History:  Procedure Laterality Date  . ABDOMINAL HYSTERECTOMY     benign indication  . CATARACT EXTRACTION W/PHACO Right 05/04/2016   Procedure: CATARACT EXTRACTION PHACO AND INTRAOCULAR LENS PLACEMENT (IOC);  Surgeon: Birder Robson, MD;  Location: [facility] ORS;  Service: Ophthalmology;  Laterality: Right;  [place] 3:02.7 AP% 23.1 CDE 42.15 Fluid pack lot # [id number]  . CATARACT EXTRACTION W/PHACO Left 06/29/2016   Procedure: CATARACT EXTRACTION PHACO AND INTRAOCULAR LENS PLACEMENT (IOC);  Surgeon: Birder Robson, MD;  Location: [facility] ORS;  Service: Ophthalmology;  Laterality: Left;  [place] 2:53.3 AP% 27.7 CDE 48.16 Fluid Pack Lot # [id number]    Prior to Admission medications   Medication Sig Start Date End Date Taking? Authorizing Provider  acetaminophen (TYLENOL) 325 MG tablet Take 2 tablets (650 mg total) by mouth every 6 (six) hours as needed for mild pain (or Fever >/= 101). 12/17/16   Mar Daring, PA-C  albuterol (PROVENTIL HFA;VENTOLIN HFA) 108 (90 Base) MCG/ACT inhaler Inhale 2 puffs into the lungs every 6 (six) hours as needed for wheezing or shortness of breath. 11/11/16   Virginia Crews, MD  aspirin 81 MG EC tablet TAKE 1 TABLET BY MOUTH EVERY  DAY 06/27/17   Bacigalupo, Dionne Bucy, MD  Calcium Carbonate-Vitamin D (CALCIUM 600+D) 600-200 MG-UNIT TABS Take 1 tablet by mouth daily.    [provider]  digoxin (LANOXIN) 0.125 MG tablet TAKE 1 TABLET EVERY DAY 05/30/18   Bacigalupo, Dionne Bucy, MD  diltiazem (CARDIZEM CD) 300 MG 24 hr capsule TAKE 1 CAPSULE EVERY DAY 07/17/18   Bacigalupo, Dionne Bucy, MD  diltiazem (TIAZAC) 300 MG 24 hr capsule Take 1 capsule (300 mg total) by mouth daily. 06/15/17   Virginia Crews, MD  Docusate Sodium (COLACE PO) Take 50 mg by mouth at bedtime.      [provider]  donepezil (ARICEPT) 10 MG tablet Take 2 tablets (20 mg total) by mouth daily. 03/27/18   Virginia Crews, MD  doxycycline (VIBRA-TABS) 100 MG tablet Take 1 tablet (100 mg total) by mouth 2 (two) times daily. 06/12/18   Virginia Crews, MD  ferrous sulfate 325 (65 FE) MG tablet Take 325 mg by mouth daily with breakfast.    [provider]  furosemide (LASIX) 40 MG tablet Take 1 tablet (40 mg total) by mouth daily. 03/27/18   Virginia Crews, MD  hydrocortisone (PROCTOSOL HC) 2.5 % rectal cream Apply 1 application topically 3 (three) times daily. 10/05/16   [provider]  ipratropium-albuterol (DUONEB) 0.5-2.5 (3) MG/3ML SOLN Take 3 mLs by nebulization every 6 (six) hours as needed. 08/14/17   [name], MD  KLOR-CON M20 20 MEQ tablet TAKE 1 TABLET BY MOUTH EVERY DAY 09/05/18   Virginia Crews, MD  lisinopril (ZESTRIL) 10 MG tablet TAKE 1 TABLET BY MOUTH EVERY DAY 09/13/18   Bacigalupo, Dionne Bucy, MD  mupirocin ointment (BACTROBAN) 2 % Apply 1 application topically 2 (two) times daily. To affected spot on arm Until resolution 08/21/18   Virginia Crews, MD  naproxen sodium (ALEVE) 220 MG tablet Take 1 tablet (220 mg total) by mouth daily as needed. 03/27/18   Virginia Crews, MD  QUEtiapine (SEROQUEL) 50 MG tablet TAKE 1 TABLET BY MOUTH TWICE A DAY 04/10/18   Bacigalupo, Dionne Bucy, MD  triamcinolone cream (KENALOG) 0.1 % Apply 1 application topically 2 (two) times daily. Due to facial picking 08/25/17   Virginia Crews, MD    Allergies Sulfa antibiotics and Coumadin [warfarin]  Family History  Problem Relation Age of Onset  . Heart disease Father   . Heart disease Mother   . Heart disease Sister   . Heart disease Brother   . Dementia Brother   . Heart attack Son 39  . Dementia Brother     Social History Social History   Tobacco Use  . Smoking status: Former Smoker    Packs/day: 1.00    Years: 50.00    Pack  years: 50.00    Types: Cigarettes    Quit date: 04/14/2016    Years since quitting: 2.4  . Smokeless tobacco: Never Used  Substance Use Topics  . Alcohol use: No  . Drug use: No    Review of Systems Unable to obtain secondary to patient's dementia  ____________________________________________   PHYSICAL EXAM:  VITAL SIGNS: ED Triage Vitals  Enc Vitals Group     BP      Pulse      Resp      Temp      Temp src      SpO2      Weight      Height  Head Circumference      Peak Flow      Pain Score      Pain Loc      Pain Edu?      Excl. in [place]?     Constitutional: Alert and intermittently combative Eyes: Conjunctivae are normal. Head: Atraumatic. Nose: No congestion/rhinnorhea. Mouth/Throat: Mucous membranes are moist. Neck: Normal ROM Cardiovascular: Normal rate, regular rhythm. Grossly normal heart sounds. Respiratory: Normal respiratory effort.  No retractions. Lungs CTAB. Gastrointestinal: Soft and nontender. No distention. Genitourinary: deferred Musculoskeletal: No lower extremity tenderness.  +1 lower extremity edema bilaterally with overlying venous stasis dermatitis, no evidence of infectious process. Neurologic:  Normal speech and language. No gross focal neurologic deficits are appreciated. Skin:  Skin is warm, dry and intact. No rash noted. Psychiatric: Mood and affect are normal. Speech and behavior are normal.  ____________________________________________   LABS (all labs ordered are listed, but only abnormal results are displayed)  Labs Reviewed  CBC WITH DIFFERENTIAL/PLATELET - Abnormal; Notable for the following components:      Result Value   RBC 3.48 (*)    Hemoglobin 9.9 (*)    HCT 32.6 (*)    RDW 17.4 (*)    All other components within normal limits  COMPREHENSIVE METABOLIC PANEL - Abnormal; Notable for the following components:   Sodium 146 (*)    Potassium 2.7 (*)    Chloride 95 (*)    CO2 38 (*)    Glucose, Bld 102 (*)    All  other components within normal limits  URINALYSIS, COMPLETE (UACMP) WITH MICROSCOPIC - Abnormal; Notable for the following components:   Color, Urine YELLOW (*)    APPearance HAZY (*)    All other components within normal limits  CULTURE, BLOOD (ROUTINE X 2)  CULTURE, BLOOD (ROUTINE X 2)  SARS CORONAVIRUS 2  MAGNESIUM  DIGOXIN LEVEL  LACTIC ACID, PLASMA  LACTIC ACID, PLASMA     PROCEDURES  Procedure(s) performed (including Critical Care):  Procedures   ____________________________________________   INITIAL IMPRESSION / ASSESSMENT AND PLAN / ED COURSE        85 year old female with history of dementia presented to the ED for decreased appetite and responsiveness over the past 3 to 4 days.  No focal deficits noted on neurologic exam, head CT negative for acute process.  Labs significant for hypokalemia, patient likely to have difficulty with p.o. repletion, will initiate IV repletion as well as repletion of magnesium.  No evidence of infectious process on chest x-ray or UA.  Case discussed with hospitalist, who accepts patient for admission.      ____________________________________________   FINAL CLINICAL IMPRESSION(S) / ED DIAGNOSES  Final diagnoses:  Hypokalemia  Altered mental status, unspecified altered mental status type     ED Discharge Orders    None       Note:  This document was prepared using Dragon voice recognition software and may include unintentional dictation errors.   Blake Divine, MD 09/19/18 1630

## 2018-09-19 NOTE — ED Notes (Signed)
Date and time results received: 09/19/18 1418  Test: potassium Critical Value: 2.7  Name of Provider Notified: Dr. Charna Archer  Orders Received? Or Actions Taken?: no new orders at this time

## 2018-09-19 NOTE — ED Notes (Signed)
ED TO INPATIENT HANDOFF REPORT  ED Nurse Name and Phone #: Helene Kelp #[phone]  S Name/Age/Gender Patricia Foley 85 y.o. female Room/Bed: ED33A/ED33A  Code Status   Code Status: DNR  Home/SNF/Other Nursing Home Patient oriented to: self Is this baseline? Yes   Triage Complete: Triage complete  Chief Complaint weakness ems  Triage Note Pt presents to ED via [place] from [place]. EMS called out for lethargy. Pt arrives combative, striking at caregivers and swearing. R leg wrapped in coban. Swelling noted to both legs, no cellulitis noted at this time. Charge RN notified pt will need sitter. Mittens applied so pt does not pull IV or leads.    Allergies Allergies  Allergen Reactions  . Sulfa Antibiotics Hives  . Coumadin [Warfarin] Rash    Level of Care/Admitting Diagnosis ED Disposition    ED Disposition Condition [facility] Area: [place] [[id number]]  Level of Care: Telemetry [5]  Covid Evaluation: Person Under Investigation (PUI)  Diagnosis: Altered mental status [780.97.ICD-9-CM]  Admitting Physician: Rufina Falco [place]  Attending Physician: Rufina Falco ACHIENG [[id number]]  Estimated length of stay: past midnight tomorrow  Certification:: I certify this patient will need inpatient services for at least 2 midnights  PT Class (Do Not Modify): Inpatient [101]  PT Acc Code (Do Not Modify): Private [1]       B Medical/Surgery History Past Medical History:  Diagnosis Date  . Anemia   . Arthritis   . Atrial fibrillation ([place])   . Circulation problem   . COPD (chronic obstructive pulmonary disease) ([place])   . Cough    CHRONIC  . Dementia ([place])   . GERD (gastroesophageal reflux disease)   . HOH (hard of hearing)   . Hypertension   . Hypothyroidism   . Tremors of nervous system   . Venous insufficiency    Past Surgical History:  Procedure Laterality Date  . ABDOMINAL HYSTERECTOMY     benign indication  .  CATARACT EXTRACTION W/PHACO Right 05/04/2016   Procedure: CATARACT EXTRACTION PHACO AND INTRAOCULAR LENS PLACEMENT (IOC);  Surgeon: Birder Robson, MD;  Location: [facility] ORS;  Service: Ophthalmology;  Laterality: Right;  [place] 3:02.7 AP% 23.1 CDE 42.15 Fluid pack lot # [id number]  . CATARACT EXTRACTION W/PHACO Left 06/29/2016   Procedure: CATARACT EXTRACTION PHACO AND INTRAOCULAR LENS PLACEMENT (IOC);  Surgeon: Birder Robson, MD;  Location: [facility] ORS;  Service: Ophthalmology;  Laterality: Left;  [place] 2:53.3 AP% 27.7 CDE 48.16 Fluid Pack Lot # [id number]     A IV Location/Drains/Wounds Patient Lines/Drains/Airways Status   Active Line/Drains/Airways    Name:   Placement date:   Placement time:   Site:   Days:   Peripheral IV 09/19/18 Left Forearm   09/19/18    1313    Forearm   less than 1          Intake/Output Last 24 hours  Intake/Output Summary (Last 24 hours) at 09/19/2018 1837 Last data filed at 09/19/2018 1737 Gross per 24 hour  Intake 650 ml  Output -  Net 650 ml    Labs/Imaging Results for orders placed or performed during the hospital encounter of 09/19/18 (from the past 48 hour(s))  CBC with Differential     Status: Abnormal   Collection Time: 09/19/18  1:13 PM  Result Value Ref Range   WBC 7.6 4.0 - 10.5 K/uL   RBC 3.48 (L) 3.87 - 5.11 MIL/uL   Hemoglobin 9.9 (L) 12.0 - 15.0 g/dL  HCT 32.6 (L) 36.0 - 46.0 %   MCV 93.7 80.0 - 100.0 fL   MCH 28.4 26.0 - 34.0 pg   MCHC 30.4 30.0 - 36.0 g/dL   RDW 17.4 (H) 11.5 - 15.5 %   Platelets 182 150 - 400 K/uL   nRBC 0.0 0.0 - 0.2 %   Neutrophils Relative % 65 %   Neutro Abs 5.0 1.7 - 7.7 K/uL   Lymphocytes Relative 20 %   Lymphs Abs 1.5 0.7 - 4.0 K/uL   Monocytes Relative 12 %   Monocytes Absolute 0.9 0.1 - 1.0 K/uL   Eosinophils Relative 1 %   Eosinophils Absolute 0.1 0.0 - 0.5 K/uL   Basophils Relative 1 %   Basophils Absolute 0.1 0.0 - 0.1 K/uL   Immature Granulocytes 1 %   Abs Immature Granulocytes 0.04 0.00 - 0.07  K/uL    Comment: Performed at [facility], [address] [place], [place] [address]  Comprehensive metabolic panel     Status: Abnormal   Collection Time: 09/19/18  1:13 PM  Result Value Ref Range   Sodium 146 (H) 135 - 145 mmol/L   Potassium 2.7 (LL) 3.5 - 5.1 mmol/L    Comment: CRITICAL RESULT CALLED TO, READ BACK BY AND VERIFIED WITH ALISHA GRANGER 09/19/18 1418 KLW    Chloride 95 (L) 98 - 111 mmol/L   CO2 38 (H) 22 - 32 mmol/L   Glucose, Bld 102 (H) 70 - 99 mg/dL   BUN 20 8 - 23 mg/dL   Creatinine, Ser 0.73 0.44 - 1.00 mg/dL   Calcium 9.4 8.9 - 10.3 mg/dL   Total Protein 7.7 6.5 - 8.1 g/dL   Albumin 3.8 3.5 - 5.0 g/dL   AST 25 15 - 41 U/L   ALT 17 0 - 44 U/L   Alkaline Phosphatase 118 38 - 126 U/L   Total Bilirubin 1.1 0.3 - 1.2 mg/dL   GFR calc non Af Amer >60 >60 mL/min   GFR calc Af Amer >60 >60 mL/min   Anion gap 13 5 - 15    Comment: Performed at [facility], [address] [place], [place] [address]  Urinalysis, Complete w Microscopic     Status: Abnormal   Collection Time: 09/19/18  1:13 PM  Result Value Ref Range   Color, Urine YELLOW (A) YELLOW   APPearance HAZY (A) CLEAR   Specific Gravity, Urine 1.011 1.005 - 1.030   pH 7.0 5.0 - 8.0   Glucose, UA NEGATIVE NEGATIVE mg/dL   Hgb urine dipstick NEGATIVE NEGATIVE   Bilirubin Urine NEGATIVE NEGATIVE   Ketones, ur NEGATIVE NEGATIVE mg/dL   Protein, ur NEGATIVE NEGATIVE mg/dL   Nitrite NEGATIVE NEGATIVE   Leukocytes,Ua NEGATIVE NEGATIVE   RBC / HPF 0-5 0 - 5 RBC/hpf   WBC, UA 0-5 0 - 5 WBC/hpf   Bacteria, UA NONE SEEN NONE SEEN   Squamous Epithelial / LPF 0-5 0 - 5   Mucus PRESENT    Hyaline Casts, UA PRESENT     Comment: Performed at [facility], [address] [place], [place] [address]  Magnesium     Status: None   Collection Time: 09/19/18  1:18 PM  Result Value Ref Range   Magnesium 2.0 1.7 - 2.4 mg/dL    Comment: Performed at [facility], [address] [place], [place] [address]  Digoxin level     Status: None   Collection Time: 09/19/18  1:18 PM  Result  Value Ref Range   Digoxin Level 0.9 0.8 - 2.0 ng/mL    Comment: Performed at [facility], [address] [place], [place] [address]  Lactic acid, plasma     Status: None   Collection Time: 09/19/18  3:50 PM  Result Value Ref Range   Lactic Acid, Venous 0.9 0.5 - 1.9 mmol/L    Comment: Performed at [facility], [address] [place], [place] [address]   Ct Head Wo Contrast  Result Date: 09/19/2018 CLINICAL DATA:  Altered LOC EXAM: CT HEAD WITHOUT CONTRAST TECHNIQUE: Contiguous axial images were obtained from the base of the skull through the vertex without intravenous contrast. COMPARISON:  06/15/2013 FINDINGS: Brain: No acute territorial infarction, hemorrhage, or new intracranial mass. Atrophy and moderate small vessel ischemic changes of the white matter. Calcified parafalcine mass anteriorly consistent with meningioma, this measures 2.2 cm. Second smaller posterior calcified mass near the torcula measuring 9 mm. No significant mass effect. Vascular: No hyperdense vessels.  Carotid vascular calcification. Skull: Normal. Negative for fracture or focal lesion. Sinuses/Orbits: Patchy mucosal thickening in the ethmoid sinuses Other: None IMPRESSION: 1. No CT evidence for acute intracranial abnormality. 2. Atrophy and mild small vessel ischemic changes of the white matter. Stable calcified anterior parafalcine and posterior calcified meningiomas. Electronically Signed   By: Donavan Foil M.D.   On: 09/19/2018 15:42   Dg Chest Portable 1 View  Result Date: 09/19/2018 CLINICAL DATA:  Generalized weakness EXAM: PORTABLE CHEST 1 VIEW COMPARISON:  August 14, 2017 FINDINGS: Heart size is enlarged. Aortic calcifications are noted. Chronic lung markings are noted bilaterally. There are advanced degenerative changes of the left glenohumeral joint. There is no acute osseous abnormality. No  pneumothorax. A dense retrocardiac nodule is noted, not significantly changed from prior study. This likely represents a calcified granuloma. IMPRESSION: No active disease. Electronically Signed   By: Constance Holster M.D.   On: 09/19/2018 15:17    Pending Labs Unresulted Labs (From admission, onward)    Start     Ordered   09/20/18 0500  CBC  Tomorrow morning,   STAT     09/19/18 1549   09/20/18 9937  Basic metabolic panel  Tomorrow morning,   STAT     09/19/18 1549   09/19/18 1629  SARS CORONAVIRUS 2 Nasal Swab Aptima Multi Swab  (Asymptomatic/Tier 2 Patients Labs)  ONCE - STAT,   STAT    Question Answer Comment  Is this test for diagnosis or screening Screening   Symptomatic for COVID-19 as defined by CDC No   Hospitalized for COVID-19 No   Admitted to ICU for COVID-19 No   Previously tested for COVID-19 No   Resident in a congregate (group) care setting Yes   Employed in healthcare setting No   Pregnant No      09/19/18 1628   09/19/18 1550  CULTURE, BLOOD (ROUTINE X 2) w Reflex to ID Panel  BLOOD CULTURE X 2,   STAT     09/19/18 1549   09/19/18 1550  Lactic acid, plasma  STAT Now then every 3 hours,   STAT     09/19/18 1549          Vitals/Pain Today's Vitals   09/19/18 1515 09/19/18 1557 09/19/18 1600 09/19/18 1630  BP:   (!) 137/45 (!) 131/52  Pulse: (!) 57 (!) 55 (!) 57 (!) 56  Resp: 11  17 16   Temp:      TempSrc:      SpO2:  94% 95% 97% 93%  Weight:      Height:        Isolation Precautions Droplet and Contact precautions  Medications Medications  potassium chloride 10 mEq in 100 mL IVPB (0 mEq Intravenous Stopped 09/19/18 1647)  enoxaparin (LOVENOX) injection 40 mg (has no administration in time range)  acetaminophen (TYLENOL) tablet 650 mg (has no administration in time range)  aspirin EC tablet 81 mg (has no administration in time range)  digoxin (LANOXIN) tablet 125 mcg (has no administration in time range)  diltiazem (CARDIZEM CD) 24 hr capsule 300  mg (has no administration in time range)  lisinopril (ZESTRIL) tablet 10 mg (has no administration in time range)  ferrous sulfate tablet 325 mg (has no administration in time range)  ipratropium-albuterol (DUONEB) 0.5-2.5 (3) MG/3ML nebulizer solution 3 mL (has no administration in time range)  hydrocortisone (ANUSOL-HC) 2.5 % rectal cream 1 application (has no administration in time range)  triamcinolone cream (KENALOG) 0.1 % 1 application (has no administration in time range)  potassium chloride SA (K-DUR) CR tablet 40 mEq (40 mEq Oral Given 09/19/18 1545)  lactated ringers bolus 500 mL (0 mLs Intravenous Stopped 09/19/18 1737)  magnesium sulfate IVPB 2 g 50 mL (0 g Intravenous Stopped 09/19/18 1646)    Mobility walks with device High fall risk   Focused Assessments see previous assessment   R Recommendations: See Admitting Provider Note  Report given to:   Additional Notes:

## 2018-09-19 NOTE — ED Notes (Signed)
Call grandson in chart with updates

## 2018-09-19 NOTE — Progress Notes (Signed)
Patient was transferred from the ER following AMS. Per report from ER nurse patient was agitated,combative and striking at caregivers. ON admission patient has a mitt on both hands and was agitated. Patient was confused and was unable to provide answer to admission questions. Patient was assisted to her bed and the mitt were removed from both hands, and patient eventually calmed down and rested for most of the night

## 2018-09-19 NOTE — ED Notes (Addendum)
Pt becomes agitated when aroused by staff so at family request not to give medication until pt arouses - 1700 medications held at this time

## 2018-09-19 NOTE — ED Triage Notes (Signed)
Pt presents to ED via [place] from [place]. EMS called out for lethargy. Pt arrives combative, striking at caregivers and swearing. R leg wrapped in coban. Swelling noted to both legs, no cellulitis noted at this time. Charge RN notified pt will need sitter. Mittens applied so pt does not pull IV or leads.

## 2018-09-20 LAB — CBC
HCT: 32.2 % — ABNORMAL LOW (ref 36.0–46.0)
Hemoglobin: 9.8 g/dL — ABNORMAL LOW (ref 12.0–15.0)
MCH: 28.6 pg (ref 26.0–34.0)
MCHC: 30.4 g/dL (ref 30.0–36.0)
MCV: 93.9 fL (ref 80.0–100.0)
Platelets: 180 10*3/uL (ref 150–400)
RBC: 3.43 MIL/uL — ABNORMAL LOW (ref 3.87–5.11)
RDW: 17.4 % — ABNORMAL HIGH (ref 11.5–15.5)
WBC: 5.5 10*3/uL (ref 4.0–10.5)
nRBC: 0 % (ref 0.0–0.2)

## 2018-09-20 LAB — BASIC METABOLIC PANEL
Anion gap: 11 (ref 5–15)
BUN: 19 mg/dL (ref 8–23)
CO2: 36 mmol/L — ABNORMAL HIGH (ref 22–32)
Calcium: 8.9 mg/dL (ref 8.9–10.3)
Chloride: 100 mmol/L (ref 98–111)
Creatinine, Ser: 0.67 mg/dL (ref 0.44–1.00)
GFR calc Af Amer: >60 mL/min (ref >60)
GFR calc non Af Amer: >60 mL/min (ref >60)
Glucose, Bld: 95 mg/dL (ref 70–99)
Potassium: 2.5 mmol/L — CL (ref 3.5–5.1)
Sodium: 147 mmol/L — ABNORMAL HIGH (ref 135–145)

## 2018-09-20 LAB — MRSA PCR SCREENING: MRSA by PCR: NEGATIVE

## 2018-09-20 LAB — MAGNESIUM
Magnesium: 2.2 mg/dL (ref 1.7–2.4)
Magnesium: 2.4 mg/dL (ref 1.7–2.4)

## 2018-09-20 MED ORDER — SODIUM CHLORIDE 0.9% FLUSH: 3.0000 mL | INTRAVENOUS | Status: DC | PRN

## 2018-09-20 MED ORDER — POTASSIUM CHLORIDE CRYS ER 20 MEQ PO TBCR
40.0000 meq | EXTENDED_RELEASE_TABLET | Freq: Once | ORAL | Status: AC
  Administered 2018-09-20: 40 meq via ORAL
  Filled 2018-09-20: qty 2

## 2018-09-20 MED ORDER — POTASSIUM CHLORIDE CRYS ER 20 MEQ PO TBCR
30.0000 meq | EXTENDED_RELEASE_TABLET | Freq: Two times a day (BID) | ORAL | Status: AC
  Administered 2018-09-20: 30 meq via ORAL
  Filled 2018-09-20 (×2): qty 1

## 2018-09-20 MED ORDER — QUETIAPINE FUMARATE 25 MG PO TABS
50.0000 mg | ORAL_TABLET | Freq: Two times a day (BID) | ORAL | Status: DC
  Administered 2018-09-20 – 2018-09-25 (×10): 50 mg via ORAL
  Filled 2018-09-20 (×12): qty 2

## 2018-09-20 MED ORDER — HALOPERIDOL LACTATE 5 MG/ML IJ SOLN
2.0000 mg | Freq: Four times a day (QID) | INTRAMUSCULAR | Status: DC | PRN
  Administered 2018-09-21 – 2018-09-22 (×2): 2 mg via INTRAVENOUS
  Filled 2018-09-20 (×2): qty 1

## 2018-09-20 MED ORDER — SODIUM CHLORIDE 0.9% FLUSH
3.0000 mL | Freq: Two times a day (BID) | INTRAVENOUS | Status: DC
  Administered 2018-09-20 – 2018-09-27 (×8): 3 mL via INTRAVENOUS

## 2018-09-20 NOTE — Telephone Encounter (Signed)
Sherry with [name] advised.

## 2018-09-20 NOTE — Progress Notes (Addendum)
[place] at [place] NAME: Patricia Foley    MR#:  [id number]  DATE OF BIRTH:  1931/05/19  SUBJECTIVE: Patient is admitted because of poor p.o. intake, increased confusion and found to have hypokalemia.  Patient is from dementia unit,  CHIEF COMPLAINT:   Chief Complaint  Patient presents with  . Altered Mental Status    REVIEW OF SYSTEMS:   Review of Systems  Unable to perform ROS: Dementia     DRUG ALLERGIES:   Allergies  Allergen Reactions  . Sulfa Antibiotics Hives  . Coumadin [Warfarin] Rash    VITALS:  Blood pressure (!) 157/76, pulse 67, temperature 98.6 F (37 C), temperature source Oral, resp. rate 19, height 5\' 2"  (1.575 m), weight 60.3 kg, SpO2 (!) 82 %.  PHYSICAL EXAMINATION:  GENERAL:  85 y.o.-year-old patient lying in the bed with no acute distress.  EYES: Pupils equal, round, reactive to light and accommodation. No scleral icterus.  HEENT: Head atraumatic, normocephalic. Oropharynx and nasopharynx clear.  NECK:  Supple, no jugular venous distention. No thyroid enlargement, no tenderness.  LUNGS: Normal breath sounds bilaterally, no wheezing, rales,rhonchi or crepitation. No use of accessory muscles of respiration.  CARDIOVASCULAR: S1, S2 normal. No murmurs, rubs, or gallops.  ABDOMEN: Soft, nontender, nondistended. Bowel sounds present. No organomegaly or mass.  EXTREMITIES: No pedal edema, cyanosis, or clubbing.  NEUROLOGIC: Unable to do full neuro exam because of dementia and not following commands. PSYCHIATRIC: The patient is alert and oriented x 3.  SKIN: Lower leg cellulitis with blisters, seen by wound care nurse, recommended Unna boots for chronic venous stasis.  Also recommended dressing changes weekly on Wednesday.  LABORATORY PANEL:   CBC Recent Labs  Lab 09/20/18 0458  WBC 5.5  HGB 9.8*  HCT 32.2*  PLT 180    ------------------------------------------------------------------------------------------------------------------  Chemistries  Recent Labs  Lab 09/19/18 1313  09/20/18 0458  NA 146*  --  147*  K 2.7*  --  2.5*  CL 95*  --  100  CO2 38*  --  36*  GLUCOSE 102*  --  95  BUN 20  --  19  CREATININE 0.73  --  0.67  CALCIUM 9.4  --  8.9  MG  --    < > 2.2  AST 25  --   --   ALT 17  --   --   ALKPHOS 118  --   --   BILITOT 1.1  --   --    < > = values in this interval not displayed.   ------------------------------------------------------------------------------------------------------------------  Cardiac Enzymes No results for input(s): TROPONINI in the last 168 hours. ------------------------------------------------------------------------------------------------------------------  RADIOLOGY:  Ct Head Wo Contrast  Result Date: 09/19/2018 CLINICAL DATA:  Altered LOC EXAM: CT HEAD WITHOUT CONTRAST TECHNIQUE: Contiguous axial images were obtained from the base of the skull through the vertex without intravenous contrast. COMPARISON:  06/15/2013 FINDINGS: Brain: No acute territorial infarction, hemorrhage, or new intracranial mass. Atrophy and moderate small vessel ischemic changes of the white matter. Calcified parafalcine mass anteriorly consistent with meningioma, this measures 2.2 cm. Second smaller posterior calcified mass near the torcula measuring 9 mm. No significant mass effect. Vascular: No hyperdense vessels.  Carotid vascular calcification. Skull: Normal. Negative for fracture or focal lesion. Sinuses/Orbits: Patchy mucosal thickening in the ethmoid sinuses Other: None IMPRESSION: 1. No CT evidence for acute intracranial abnormality. 2. Atrophy and mild small vessel ischemic changes of the white matter. Stable calcified anterior  parafalcine and posterior calcified meningiomas. Electronically Signed   By: Donavan Foil M.D.   On: 09/19/2018 15:42   Dg Chest Portable 1  View  Result Date: 09/19/2018 CLINICAL DATA:  Generalized weakness EXAM: PORTABLE CHEST 1 VIEW COMPARISON:  August 14, 2017 FINDINGS: Heart size is enlarged. Aortic calcifications are noted. Chronic lung markings are noted bilaterally. There are advanced degenerative changes of the left glenohumeral joint. There is no acute osseous abnormality. No pneumothorax. A dense retrocardiac nodule is noted, not significantly changed from prior study. This likely represents a calcified granuloma. IMPRESSION: No active disease. Electronically Signed   By: Constance Holster M.D.   On: 09/19/2018 15:17    EKG:   Orders placed or performed during the hospital encounter of 09/19/18  . EKG 12-Lead  . EKG 12-Lead    ASSESSMENT AND PLAN:   85 year old female patient from [facility] dementia unit comes because of increased agitation, combativeness and found to have severe hypokalemia. 1.  Severe r hypokalemia secondary to poor p.o. intake and also due to diuretics, diuretics are on hold.   continue to replace orally, IV as tolerated, she is on telemetry because of hypokalemia Severe Alzheimer's dementia is the main problem, patient may pull out IV access if we give IV potassium, continue oral supplements, continue to monitor on telemetry.  2.  Severe Alzheimer's dementia with agitation, combativeness episodes on and off, patient is on Aricept, CODE STATUS DNR.  Will consult psychiatry to see if there is any change in medicine indicated if not patient he is not a candidate for any further work-up a due to severe dementia with memory problems, hypokalemia can recur again.  Consult palliative care discuss goals with family to see if patient can go to hospice home.  2.  Altered mental status with hypokalemia, CT head unremarkable patient had believed to be secondary to underlying dementia.  UA is negative for UTI.  Low suspicion for infection, check a TSH, B12, digoxin level. #3 chronic atrial fibrillation, not on  anticoagulation secondary to risk of falls, continue Cardizem. #4 history of bradycardia in the ER but resolved now  . All the records are reviewed and case discussed with Care Management/Social Workerr. Management plans discussed with the patient, family and they are in agreement.  CODE STATUS: DNR  TOTAL TIME TAKING CARE OF THIS PATIENT: 35 minutes.   POSSIBLE D/C IN 1-2 DAYS, DEPENDING ON CLINICAL CONDITION.   Epifanio Lesches M.D on 09/20/2018 at 10:57 AM  Between 7am to 6pm - Pager - [phone]  After 6pm go to [email or web address] - password [email or web address]  Office  [phone]  CC: Primary care physician; Virginia Crews, MD   Note: This dictation was prepared with Dragon dictation along with smaller phrase technology. Any transcriptional errors that result from this process are unintentional.

## 2018-09-20 NOTE — Consult Note (Signed)
[place] Nurse wound consult note Reason for Consult: lower leg cellulitis with blisters, open and closed, and other open areas. Wound type: edema with LE partial thickness skin ulcerations consistent with venous stasis  Pressure Injury POA: NA Measurement: scattered areas over the bilateral LEs largest on the RLE laterally 2cm x 3cm x 0.1cm  Wound bed: all scabbed or crusted with fibrin  Drainage (amount, consistency, odor) mild weeping; serous, non purulent Periwound: edema bilaterally 1+ Dressing procedure/placement/frequency: Silicone foam to the RLE lateral area that is weeping today.  Unna's boots placed bilaterally from toes to patellar notch.   Change weekly on Wednesdays, resume changes in the SNF upon her return.  [place] Nurse team will follow with you for weekly assessments and Unna's boot change.   Please notify [place] nurses of any acute changes in the wounds or any new areas of concern [place] MSN, [place], [place], [place]

## 2018-09-20 NOTE — Progress Notes (Signed)
She took the potassium dissolved in water this morning.  She'll have nothing to do with it this afternoon,.

## 2018-09-20 NOTE — Telephone Encounter (Signed)
Appears she is hospitalized currently and BMP was obtained there.

## 2018-09-20 NOTE — Progress Notes (Signed)
Has been very lethargic all afternoon.   When prompted to take meds she wakes up enough either to take a small bit or to say no. Ate no lunch nor dinner.

## 2018-09-21 ENCOUNTER — Ambulatory Visit: Payer: Self-pay | Admitting: Family Medicine

## 2018-09-21 DIAGNOSIS — Z515 Encounter for palliative care: Secondary | ICD-10-CM

## 2018-09-21 DIAGNOSIS — Z7189 Other specified counseling: Secondary | ICD-10-CM

## 2018-09-21 DIAGNOSIS — R4182 Altered mental status, unspecified: Secondary | ICD-10-CM

## 2018-09-21 LAB — BASIC METABOLIC PANEL
Anion gap: 13 (ref 5–15)
BUN: 21 mg/dL (ref 8–23)
CO2: 33 mmol/L — ABNORMAL HIGH (ref 22–32)
Calcium: 9.3 mg/dL (ref 8.9–10.3)
Chloride: 102 mmol/L (ref 98–111)
Creatinine, Ser: 0.65 mg/dL (ref 0.44–1.00)
GFR calc Af Amer: >60 mL/min (ref >60)
GFR calc non Af Amer: >60 mL/min (ref >60)
Glucose, Bld: 139 mg/dL — ABNORMAL HIGH (ref 70–99)
Potassium: 2.9 mmol/L — ABNORMAL LOW (ref 3.5–5.1)
Sodium: 148 mmol/L — ABNORMAL HIGH (ref 135–145)

## 2018-09-21 LAB — POTASSIUM: Potassium: 3.6 mmol/L (ref 3.5–5.1)

## 2018-09-21 MED ORDER — SODIUM CHLORIDE 0.9 % IV SOLN
INTRAVENOUS | Status: DC | PRN
  Administered 2018-09-21: 250 mL via INTRAVENOUS

## 2018-09-21 MED ORDER — POTASSIUM CHLORIDE 10 MEQ/100ML IV SOLN
10.0000 meq | INTRAVENOUS | Status: AC
  Administered 2018-09-21 (×4): 10 meq via INTRAVENOUS
  Filled 2018-09-21 (×4): qty 100

## 2018-09-21 MED ORDER — POTASSIUM CHLORIDE CRYS ER 20 MEQ PO TBCR
20.0000 meq | EXTENDED_RELEASE_TABLET | Freq: Once | ORAL | Status: DC
  Filled 2018-09-21: qty 1

## 2018-09-21 NOTE — Clinical Social Work Note (Signed)
CSW spoke to [place] at [place] [phone], she said patient is from [facility] and they will not have to reassess her.  CSW requested PT to see patient.  CSW did not have time to complete assessment.  Assessment to be completed at a later time.  Jones Broom. Angie, MSW, [place]  09/21/2018 7:51 PM

## 2018-09-21 NOTE — Care Management Important Message (Signed)
Important Message  Patient Details  Name: Patricia Foley MRN: [id number] Date of Birth: 05-10-1931   Medicare Important Message Given:  Yes     Dannette Barbara 09/21/2018, 2:05 PM

## 2018-09-21 NOTE — Progress Notes (Signed)
Patricia Foley at [place] NAME: Patricia Foley    MR#:  [id number]  DATE OF BIRTH:  [birth year]  SUBJECTIVE: Patient is admitted because of poor p.o. intake, increased confusion and found to have hypokalemia.  Patient is from dementia unit,  CHIEF COMPLAINT:   Chief Complaint  Patient presents with  . Altered Mental Status  Hypokalemia slightly improved, has baseline confusion with underlying severe dementia.  REVIEW OF SYSTEMS:   Review of Systems  Unable to perform ROS: Dementia     DRUG ALLERGIES:   Allergies  Allergen Reactions  . Sulfa Antibiotics Hives  . Coumadin [Warfarin] Rash    VITALS:  Blood pressure (!) 168/60, pulse 62, temperature 97.7 F (36.5 C), resp. rate 18, height 5\' 2"  (1.575 m), weight 71.2 kg, SpO2 94 %.  PHYSICAL EXAMINATION:  GENERAL:  85 y.o.-year-old patient lying in the bed with no acute distress.  EYES: Pupils equal, round, reactive to light  No scleral icterus.  HEENT: Head atraumatic, normocephalic. Oropharynx and nasopharynx clear.  NECK:  Supple, no jugular venous distention. No thyroid enlargement, no tenderness.  LUNGS: Normal breath sounds bilaterally, no wheezing, rales,rhonchi or crepitation. No use of accessory muscles of respiration.  CARDIOVASCULAR: S1, S2 normal. No murmurs, rubs, or gallops.  ABDOMEN: Soft, nontender, nondistended. Bowel sounds present. No organomegaly or mass.  EXTREMITIES: No pedal edema, cyanosis, or clubbing.  NEUROLOGIC: Unable to do full neuro exam because of dementia and not following commands. PSYCHIATRIC: The patient is alert and oriented x 3.  SKIN: Lower leg cellulitis with blisters, seen by wound care nurse, recommended Unna boots for chronic venous stasis.  Also recommended dressing changes weekly on Wednesday.  LABORATORY PANEL:   CBC Recent Labs  Lab 09/20/18 0458  WBC 5.5  HGB 9.8*  HCT 32.2*  PLT 180    ------------------------------------------------------------------------------------------------------------------  Chemistries  Recent Labs  Lab 09/19/18 1313  09/20/18 1213 09/21/18 0531  NA 146*   < >  --  148*  K 2.7*   < >  --  2.9*  CL 95*   < >  --  102  CO2 38*   < >  --  33*  GLUCOSE 102*   < >  --  139*  BUN 20   < >  --  21  CREATININE 0.73   < >  --  0.65  CALCIUM 9.4   < >  --  9.3  MG  --    < > 2.4  --   AST 25  --   --   --   ALT 17  --   --   --   ALKPHOS 118  --   --   --   BILITOT 1.1  --   --   --    < > = values in this interval not displayed.   ------------------------------------------------------------------------------------------------------------------  Cardiac Enzymes No results for input(s): TROPONINI in the last 168 hours. ------------------------------------------------------------------------------------------------------------------  RADIOLOGY:  Ct Head Wo Contrast  Result Date: 09/19/2018 CLINICAL DATA:  Altered LOC EXAM: CT HEAD WITHOUT CONTRAST TECHNIQUE: Contiguous axial images were obtained from the base of the skull through the vertex without intravenous contrast. COMPARISON:  06/15/2013 FINDINGS: Brain: No acute territorial infarction, hemorrhage, or new intracranial mass. Atrophy and moderate small vessel ischemic changes of the white matter. Calcified parafalcine mass anteriorly consistent with meningioma, this measures 2.2 cm. Second smaller posterior calcified mass near the torcula measuring 9 mm. No  significant mass effect. Vascular: No hyperdense vessels.  Carotid vascular calcification. Skull: Normal. Negative for fracture or focal lesion. Sinuses/Orbits: Patchy mucosal thickening in the ethmoid sinuses Other: None IMPRESSION: 1. No CT evidence for acute intracranial abnormality. 2. Atrophy and mild small vessel ischemic changes of the white matter. Stable calcified anterior parafalcine and posterior calcified meningiomas.  Electronically Signed   By: Donavan Foil M.D.   On: 09/19/2018 15:42   Dg Chest Portable 1 View  Result Date: 09/19/2018 CLINICAL DATA:  Generalized weakness EXAM: PORTABLE CHEST 1 VIEW COMPARISON:  August 14, 2017 FINDINGS: Heart size is enlarged. Aortic calcifications are noted. Chronic lung markings are noted bilaterally. There are advanced degenerative changes of the left glenohumeral joint. There is no acute osseous abnormality. No pneumothorax. A dense retrocardiac nodule is noted, not significantly changed from prior study. This likely represents a calcified granuloma. IMPRESSION: No active disease. Electronically Signed   By: Constance Holster M.D.   On: 09/19/2018 15:17    EKG:   Orders placed or performed during the hospital encounter of 09/19/18  . EKG 12-Lead  . EKG 12-Lead    ASSESSMENT AND PLAN:   85 year old female patient from [facility] dementia unit comes because of increased agitation, combativeness and found to have severe hypokalemia. 1.  Severe  hypokalemia secondary to poor p.o. intake and also due to diuretics, diuretics are on hold.   continue to replace orally, IV as tolerated, she is on telemetry because of hypokalemia, hypokalemia improving, potassium 2.9 today.  Severe Alzheimer's dementia ; consult palliative care   2.  Severe Alzheimer's dementia with agitation, combativeness episodes on and off, patient is on Aricept, CODE STATUS DNR.  Physical therapy consult requested for final disposition.   3..  Altered mental status with hypokalemia, CT head unremarkable patient had believed to be secondary to underlying dementia.  UA is negative for UTI.  Low suspicion for infection,   #3 .chronic atrial fibrillation, not on anticoagulation secondary to risk of falls, continue Cardizem. #4 history of bradycardia in the ER but resolved now  . All the records are reviewed and case discussed with Care Management/Social Workerr. Management plans discussed with the  patient, family and they are in agreement.  CODE STATUS: DNR  TOTAL TIME TAKING CARE OF THIS PATIENT: 35 minutes.   POSSIBLE D/C IN 1-2 DAYS, DEPENDING ON CLINICAL CONDITION.   Epifanio Lesches M.D on 09/21/2018 at 2:16 PM  Between 7am to 6pm - Pager - [phone]  After 6pm go to [email or web address] - password [email or web address] [place] [facility]  Office  [phone]  CC: Primary care physician; Virginia Crews, MD   Note: This dictation was prepared with Dragon dictation along with smaller phrase technology. Any transcriptional errors that result from this process are unintentional.

## 2018-09-21 NOTE — Progress Notes (Signed)
Patient placed on low bed due to safety precaution per policy. Patient was very combative and confused through the night. MD notified and ordered Haldol which was ineffective. Patient continues in b/l hand mitten with bed alarm in place. Will continue to monitor and endorse.

## 2018-09-21 NOTE — Consult Note (Signed)
PHARMACY CONSULT NOTE - FOLLOW UP  Pharmacy Consult for Electrolyte Monitoring and Replacement   Recent Labs: Potassium (mmol/L)  Date Value  09/21/2018 2.9 (L)  01/08/2014 3.1 (L)   Magnesium (mg/dL)  Date Value  09/20/2018 2.4  05/22/2013 2.1   Calcium (mg/dL)  Date Value  09/21/2018 9.3   Calcium, Total (mg/dL)  Date Value  01/08/2014 8.7   Albumin (g/dL)  Date Value  09/19/2018 3.8  11/14/2017 4.4  05/21/2013 2.9 (L)   Sodium (mmol/L)  Date Value  09/21/2018 148 (H)  11/14/2017 140  01/08/2014 139  Corrected Ca: 9.46   Assessment: Patient presents with AMS and has hypokalemia. Will replace potassium.   K 2.5 >>  2.9  Scr 0.67 >> 0.65  Goal of Therapy:  Electrolytes WNL.   Plan:  Will order KCL IV 10 mEq x4 runs and KCL Po20 mEq x1 dose. Will recheck potassium at 2100.  Will follow electrolytes with AM labs.   Rowland Lathe ,PharmD Clinical Pharmacist 09/21/2018 3:13 PM

## 2018-09-21 NOTE — Plan of Care (Signed)
  Problem: Clinical Measurements: Goal: Respiratory complications will improve Outcome: Progressing   Problem: Education: Goal: Knowledge of General Education information will improve Description: Including pain rating scale, medication(s)/side effects and non-pharmacologic comfort measures Outcome: Not Progressing Note: Patient is confused, unable to form a comprehensible sentence.   Problem: Health Behavior/Discharge Planning: Goal: Ability to manage health-related needs will improve Outcome: Not Progressing   Problem: Pain Managment: Goal: General experience of comfort will improve Outcome: Completed/Met   Problem: Safety: Goal: Ability to remain free from injury will improve Outcome: Completed/Met

## 2018-09-21 NOTE — Consult Note (Signed)
PHARMACY CONSULT NOTE - FOLLOW UP  Pharmacy Consult for Electrolyte Monitoring and Replacement   Recent Labs: Potassium (mmol/L)  Date Value  09/21/2018 3.6  01/08/2014 3.1 (L)   Magnesium (mg/dL)  Date Value  09/20/2018 2.4  05/22/2013 2.1   Calcium (mg/dL)  Date Value  09/21/2018 9.3   Calcium, Total (mg/dL)  Date Value  01/08/2014 8.7   Albumin (g/dL)  Date Value  09/19/2018 3.8  11/14/2017 4.4  05/21/2013 2.9 (L)   Sodium (mmol/L)  Date Value  09/21/2018 148 (H)  11/14/2017 140  01/08/2014 139  Corrected Ca: 9.46   Assessment: Patient presents with AMS and has hypokalemia. Will replace potassium.   K 2.5 >>  2.9  K 0806 @ 2145 = 3.6 Scr 0.67 >> 0.65  Goal of Therapy:  Electrolytes WNL.   Plan:  0806pm: K within normal limits.  No replacement needed at this time.  Will follow up electrolytes with AM labs.   Ena Dawley ,PharmD Clinical Pharmacist 09/21/2018 11:36 PM

## 2018-09-21 NOTE — Plan of Care (Signed)
  Problem: Education: Goal: Knowledge of General Education information will improve Description: Including pain rating scale, medication(s)/side effects and non-pharmacologic comfort measures Outcome: Not Progressing Note: Patient confused and with incomprehensible speech. Restraint mitts remain on bilaterally. Remains in a low bed. Will continue to monitor neurological status. Wenda Low [facility]

## 2018-09-21 NOTE — Progress Notes (Signed)
PT Cancellation Note  Patient Details Name: Patricia Foley MRN: [id number] DOB: [birth year]   Cancelled Treatment:    Reason Eval/Treat Not Completed: Patient's level of consciousness Order received and chart reviewed. PT attempted 4801 today. Pt is constantly not responding with eyes closed to external stimulus including R UE AAROM, voices, and skin touching. Nurse communicated. Will re-attempt PT session at a later time/date.    Sherrilyn Rist, SPT 09/21/2018, 4:25 PM

## 2018-09-21 NOTE — Consult Note (Signed)
Consultation Note Date: 09/21/2018   Patient Name: Patricia Foley  DOB: October 24, 1931  MRN: [id number]  Age / Sex: 85 y.o., female  PCP: Virginia Crews, MD Referring Physician: Epifanio Lesches, MD  Reason for Consultation: Establishing goals of care  HPI/Patient Profile: 85 y.o. female  with past medical history of dementia, chronic venous stasis (with bilateral leg edema and skin breakdown/weeping), CHF, atrial fibrillation, HTN, COPD, hypothyroidism admitted on 09/19/2018 with altered mental status with hypokalemia after increase in Lasix.   Clinical Assessment and Goals of Care: I met today at Patricia Foley's bedside. She is hunched over in bed and drool from her mouth. She turns to look at me when I call her name and touch her arm, makes a noise, then she turns back and goes back to sleep.   I called and spoke with her grandson, Patricia Foley. Patricia Foley tells me that Patricia Foley at baseline feeds herself and walks around. "On a good day she can dance." He says that he had a window visit with her 2 weeks ago and she was walking then and appeared to be at baseline as she spoke with him but got frustrated that he would not come inside. He is unaware is there has been any significant changes in her status as he has been in communication with facility but has been unable to visit as he typically would d/t COVID restrictions.   We discuss GOC and he confirms DNR, NO feeding tube. She has been very clear with Patricia Foley that she values independence and would not want her life prolonged without quality. I was very frank with Patricia Foley that it is yet to be seen how she will improve from these state. I explained that she may not get much better and with her underlying dementia even a small illness can have drastic and lasting effects on QOL. He understands. We discussed visitation as I believe it is important that he sees her so he further  understands how ill she is and he would like to be able to visit with her - I cleared this with RN.   Will have to see if she is able to have any improvement in functional status over next few days. Her potassium is still being corrected at current time. May need to consider hospice to be added to her care at facility if she shows little or no improvement.   I will not be available for follow up until Monday.   Primary Decision Maker HCPOA grandson Patricia Foley    SUMMARY OF RECOMMENDATIONS   - DNR - Hopeful for improvement - QOL is important  Code Status/Advance Care Planning:  DNR   Symptom Management:   Per primary.   Palliative Prophylaxis:   Aspiration, Delirium Protocol, Frequent Pain Assessment, Palliative Wound Care and Turn Reposition  Psycho-social/Spiritual:   Desire for further Chaplaincy support:no  Additional Recommendations: Education on Hospice  Prognosis:   Dependent on progress of the next couple days (improvement in mental status? Able to eat/drink?)  Discharge Planning:  To Be Determined      Primary Diagnoses: Present on Admission:  Altered mental status   I have reviewed the medical record, interviewed the patient and family, and examined the patient. The following aspects are pertinent.  Past Medical History:  Diagnosis Date   Anemia    Arthritis    Atrial fibrillation (HCC)    Circulation problem    COPD (chronic obstructive pulmonary disease) (HCC)    Cough    CHRONIC   Dementia (HCC)    GERD (gastroesophageal reflux disease)    HOH (hard of hearing)    Hypertension    Hypothyroidism    Tremors of nervous system    Venous insufficiency    Social History   Socioeconomic History   Marital status: Widowed    Spouse name: Not on file   Number of children: Not on file   Years of education: Not on file   Highest education level: Not on file  Occupational History   Occupation: unemployed    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  Social Needs   Financial resource strain: Not on file   Food insecurity    Worry: Not on file    Inability: Not on file   Transportation needs    Medical: Not on file    Non-medical: Not on file  Tobacco Use   Smoking status: Former Smoker    Packs/day: 1.00    Years: 50.00    Pack years: 50.00    Types: Cigarettes    Quit date: 04/14/2016    Years since quitting: 2.4   Smokeless tobacco: Never Used  Substance and Sexual Activity   Alcohol use: No   Drug use: No   Sexual activity: Not on file  Lifestyle   Physical activity    Days per week: Not on file    Minutes per session: Not on file   Stress: Not on file  Relationships   Social connections    Talks on phone: Not on file    Gets together: Not on file    Attends religious service: Not on file    Active member of club or organization: Not on file    Attends meetings of clubs or organizations: Not on file    Relationship status: Not on file  Other Topics Concern   Not on file  Social History Narrative   Not on file   Family History  Problem Relation Age of Onset   Heart disease Father    Heart disease Mother    Heart disease Sister    Heart disease Brother    Dementia Brother    Heart attack Son 56   Dementia Brother    Scheduled Meds:  aspirin EC  81 mg Oral Daily   digoxin  125 mcg Oral Daily   diltiazem  300 mg Oral Daily   enoxaparin (LOVENOX) injection  40 mg Subcutaneous Q24H   ferrous sulfate  325 mg Oral Q breakfast   lisinopril  10 mg Oral Daily   potassium chloride  20 mEq Oral Once   QUEtiapine  50 mg Oral BID   sodium chloride flush  3 mL Intravenous Q12H   Continuous Infusions:  potassium chloride     PRN Meds:.acetaminophen, haloperidol lactate, hydrocortisone, ipratropium-albuterol, sodium chloride flush, triamcinolone cream Allergies  Allergen Reactions   Sulfa Antibiotics Hives   Coumadin [Warfarin] Rash   Review of Systems  Unable to  perform ROS: Dementia    Physical Exam Vitals signs  and nursing note reviewed.  Constitutional:      General: She is not in acute distress.    Appearance: She is ill-appearing.     Comments: Frail, elderly  Cardiovascular:     Rate and Rhythm: Normal rate. Rhythm irregularly irregular.  Pulmonary:     Effort: Pulmonary effort is normal. No tachypnea, accessory muscle usage or respiratory distress.     Comments: Drooling out side of mouth, unable to protect her airway Abdominal:     General: Abdomen is flat.     Palpations: Abdomen is soft.  Neurological:     Comments: Minimally responsive     Vital Signs: BP (!) 168/60 (BP Location: Left Arm)    Pulse 62    Temp 97.7 F (36.5 C)    Resp 18    Ht 5' 2" (1.575 m)    Wt 71.2 kg    SpO2 94%    BMI 28.72 kg/m  Pain Scale: PAINAD       SpO2: SpO2: 94 % O2 Device:SpO2: 94 % O2 Flow Rate: .O2 Flow Rate (L/min): 2 L/min  IO: Intake/output summary:   Intake/Output Summary (Last 24 hours) at 09/21/2018 1533 Last data filed at 09/21/2018 1411 Gross per 24 hour  Intake 0 ml  Output 300 ml  Net -300 ml    LBM: Last BM Date: 09/20/18 Baseline Weight: Weight: 68 kg Most recent weight: Weight: 71.2 kg     Palliative Assessment/Data: 20%     Time In: 1630 Time Out: 1720 Time Total: 50 min Greater than 50%  of this time was spent counseling and coordinating care related to the above assessment and plan.  Signed by: Vinie Sill, NP Palliative Medicine Team Pager # [phone] (M-F 8a-5p) Team Phone # [phone] (Nights/Weekends)

## 2018-09-22 DIAGNOSIS — Z7189 Other specified counseling: Secondary | ICD-10-CM

## 2018-09-22 DIAGNOSIS — Z515 Encounter for palliative care: Secondary | ICD-10-CM

## 2018-09-22 LAB — BASIC METABOLIC PANEL
Anion gap: 11 (ref 5–15)
BUN: 21 mg/dL (ref 8–23)
CO2: 36 mmol/L — ABNORMAL HIGH (ref 22–32)
Calcium: 9.1 mg/dL (ref 8.9–10.3)
Chloride: 100 mmol/L (ref 98–111)
Creatinine, Ser: 0.54 mg/dL (ref 0.44–1.00)
GFR calc Af Amer: >60 mL/min (ref >60)
GFR calc non Af Amer: >60 mL/min (ref >60)
Glucose, Bld: 107 mg/dL — ABNORMAL HIGH (ref 70–99)
Potassium: 3.3 mmol/L — ABNORMAL LOW (ref 3.5–5.1)
Sodium: 147 mmol/L — ABNORMAL HIGH (ref 135–145)

## 2018-09-22 LAB — PHOSPHORUS: Phosphorus: 3.4 mg/dL (ref 2.5–4.6)

## 2018-09-22 LAB — MAGNESIUM: Magnesium: 2 mg/dL (ref 1.7–2.4)

## 2018-09-22 MED ORDER — POTASSIUM CHLORIDE CRYS ER 20 MEQ PO TBCR
20.0000 meq | EXTENDED_RELEASE_TABLET | Freq: Once | ORAL | Status: DC
  Filled 2018-09-22: qty 1

## 2018-09-22 MED ORDER — FUROSEMIDE 10 MG/ML IJ SOLN
40.0000 mg | Freq: Once | INTRAMUSCULAR | Status: AC
  Administered 2018-09-22: 40 mg via INTRAVENOUS
  Filled 2018-09-22: qty 4

## 2018-09-22 NOTE — Consult Note (Signed)
PHARMACY CONSULT NOTE - FOLLOW UP  Pharmacy Consult for Electrolyte Monitoring and Replacement   Recent Labs: Potassium (mmol/L)  Date Value  09/22/2018 3.3 (L)  01/08/2014 3.1 (L)   Magnesium (mg/dL)  Date Value  09/22/2018 2.0  05/22/2013 2.1   Calcium (mg/dL)  Date Value  09/22/2018 9.1   Calcium, Total (mg/dL)  Date Value  01/08/2014 8.7   Albumin (g/dL)  Date Value  09/19/2018 3.8  11/14/2017 4.4  05/21/2013 2.9 (L)   Phosphorus (mg/dL)  Date Value  09/22/2018 3.4   Sodium (mmol/L)  Date Value  09/22/2018 147 (H)  11/14/2017 140  01/08/2014 139  Corrected Ca: 9.46   Assessment: Patient presents with AMS and has hypokalemia. Potassium 20 mEq x1 dose was not given yesterday - per [facility] patient refused.    K 2.5 >>  2.9  --> rechecked @ 2145 = 3.6 >> 3.3 Scr 0.67 >> 0.65 >> 0.54  Goal of Therapy:  Electrolytes WNL.   Plan:  Will order KCL 20 mEq x1 dose.   Will follow up electrolytes with AM labs.   Rowland Lathe ,PharmD Clinical Pharmacist 09/22/2018 7:56 AM

## 2018-09-22 NOTE — TOC Initial Note (Addendum)
Transition of Care [facility]) - Initial/Assessment Note    Patient Details  Name: Patricia Foley MRN: [id number] Date of Birth: 10/20/1931  [facility] CM/SW Contact:    Patricia Stack, RN Phone Number: 09/22/2018, 5:12 PM  Clinical Narrative:                Patient is resident of [facility] Unit.  Spoke with Patricia Foley from the facility and patient is currently open to [facility] RN and PT.  She does not have chronic oxygen. Patient has been able to ambulate independently without need of device or assistance but this has changed recently.  She has had some behavior disturbance.  Patricia Foley [phone])states that faciliy will need to reassess patient prior to retuning.  She thinks patient needs a higher level of care due to decline in functional status. Discussed that patient's advanced dementia may prevent her from improving in an unfamiliar environment in a skilled facility. There was discussion regarding patient returning with hospice. Patricia Foley stated that  return would have to be approved by facility [facility].  Physical therapy has consulted and recommending return to her memory care unit but doubt that therapist is aware of the reported previous level of functioning. Reached out to grandson Patricia Foley to discuss discussion with Patricia Foley and concern about her functional status and how it may affect her return to [facility].  Left voicemail.  FL2 completed for assisted living level of care.    Expected Discharge Plan: (Unsure) Barriers to Discharge: Continued Medical Work up   Patient Goals and CMS Choice Patient states their goals for this hospitalization and ongoing recovery are:: Unable to state CMS Medicare.gov Compare Post Acute Care list provided to:: Patient Represenative (must comment)    Expected Discharge Plan and Services Expected Discharge Plan: Patricia Foley)                                              Prior Living Arrangements/Services   Lives with::  Facility Resident Patient language and need for interpreter reviewed:: Yes        Need for Family Participation in Patient Care: No (Comment) Care giver support system in place?: Yes (comment) Current home services: Home PT, Home RN([facility]ru [facility]) Criminal Activity/Legal Involvement Pertinent to Current Situation/Hospitalization: No - Comment as needed  Activities of Daily Living      Permission Sought/Granted                  Emotional Assessment Appearance:: Appears older than stated age     Orientation: : (disorientated) Alcohol / Substance Use: Not Applicable Psych Involvement: No (comment)  Admission diagnosis:  Hypokalemia [E87.6] Altered mental status, unspecified altered mental status type [R41.82] Patient Active Problem List   Diagnosis Date Noted  . Goals of care, counseling/discussion   . Palliative care encounter   . Altered mental status 09/19/2018  . (HFpEF) heart failure with preserved ejection fraction ([place]) 08/23/2017  . Acute respiratory failure with hypoxia ([place]) 08/23/2017  . SOB (shortness of breath) 08/10/2017  . Nonhealing nonsurgical wound 05/11/2017  . Callus of foot 05/11/2017  . Urinary frequency 02/10/2017  . COPD (chronic obstructive pulmonary disease) ([place])   . Dementia ([place])   . GERD (gastroesophageal reflux disease)   . Hypertension   . Tremors of nervous system   . Hypothyroidism   .  Atrial fibrillation ([place])   . HOH (hard of hearing)   . Anemia   . Arthritis   . Arthritis of ankle, right 05/28/2016  . Essential tremor 03/27/2016  . Chronic insomnia 03/27/2016  . Chronic venous insufficiency 10/04/2015  . Hemorrhoids without complication 22/41/1464  . Varicose veins of both legs with edema 09/07/2014   PCP:  Patricia Crews, MD Pharmacy:   [facility], [place] [address] Phone: [phone] Fax: [facility], [place] [address] [place] [address] Phone: [phone] Fax: [phone]  [id number] [place], [place] [address] Phone: [phone] Fax: [phone]     Social Determinants of Health (SDOH) Interventions    Readmission Risk Interventions No flowsheet data found.

## 2018-09-22 NOTE — TOC Progression Note (Addendum)
Transition of Care [facility]) - Progression Note    Patient Details  Name: [name] MRN: [id number] Date of Birth: 08-02-1931  Transition of Care [facility]) CM/SW Contact  Katrina Stack, RN Phone Number: 09/22/2018, 7:15 PM  Clinical Narrative:   Spoke with tim stanfield patient"s grandson. Patient was ambulating last week. Has chronic leg swelling that is being treated with unna boots. CM relayed the discussion with Beth about whether patient could return.  Stated he has not been informed that was a possibility that patient may not be allowed to return. CM discussed skilled nursing option, need for insurance approval and if not approved, asked if there were finances to support a higher level of care. He will discuss this with patient's niece who manages patient's finances. Another option could be return to [place] with hired caregivers if there are finances. Tim verbalizes he has not been contacted by rounding physicians. Entered communication for attending to call Tim and instructed Tim to contact nursing unit if does not hear from attending. Acknowledges speaking with palliative. FL2 completed and received pasarr in the event patient does required SNF. 02 is acute   Expected Discharge Plan: (Unsure) Barriers to Discharge: Continued Medical Work up  Expected Discharge Plan and Services Expected Discharge Plan: (Unsure)       Living arrangements for the past 2 months: Assisted Living Facility                                       Social Determinants of Health (SDOH) Interventions    Readmission Risk Interventions No flowsheet data found.

## 2018-09-22 NOTE — Evaluation (Signed)
Physical Therapy Evaluation Patient Details Name: Patricia Foley MRN: [id number] DOB: [birth year] Today's Date: 09/22/2018   History of Present Illness  presented to ER secondary to AMS, lethargy and decreased responsiveness; admitted for management of AMS, dementia with behavioral disturbances  Clinical Impression  Patient awake upon arrival to room; generally restless, noted to be constantly fidgeting with lines, linens, hands.  Acknowledges therapist presence in room, but demonstrates limited ability to actively follow verbal commands.  Responds well to hand-over-hand guidance, completing bed mobility with mod/max assist; sit/stand, basic transfers and short-distance gait (3' forward, lateral, backward) with L HHA.  Poor standing balance with limited/no insight and awareness into safety needs and functional deficits.  Unsafe to attempt any mobility without +1 hands-on assist at all times. Would benefit from skilled PT to address above deficits and maintain mobility indep while hospitalized; recommend return to memory care with continued PT services as appropriate.  Anticipate optimal performance and participation in familiar environment with meaningful, automatic tasks.     Follow Up Recommendations (therapy services at memory care as appropriate)    Equipment Recommendations       Recommendations for Other Services       Precautions / Restrictions Precautions Precautions: Fall Restrictions Weight Bearing Restrictions: No      Mobility  Bed Mobility Overal bed mobility: Needs Assistance Bed Mobility: Supine to Sit;Sit to Supine     Supine to sit: Mod assist Sit to supine: Mod assist;Max assist   General bed mobility comments: hand-over-hand to initiate, guide movement  Transfers Overall transfer level: Needs assistance Equipment used: 1 person hand held assist Transfers: Sit to/from Stand Sit to Stand: Mod assist;Min assist         General transfer comment:  hand-over-hand for weight shift, sequencing and overall balance  Ambulation/Gait Ambulation/Gait assistance: Min assist;Mod assist Gait Distance (Feet): 3 Feet(3' x2, forward, laterally, backwards)         General Gait Details: shuffling steps, forward flexed posture; poor balance.  Requires UE support and constant +1 assist at all times.  Poor insight/awareness into deficits and need for assist.  Relies on manual faciltiation/guidance for task comprehension and direction.  Stairs            Wheelchair Mobility    Modified Rankin (Stroke Patients Only)       Balance Overall balance assessment: Needs assistance Sitting-balance support: No upper extremity supported;Feet supported Sitting balance-Leahy Scale: Fair     Standing balance support: Single extremity supported Standing balance-Leahy Scale: Zero                               Pertinent Vitals/Pain Pain Assessment: Faces Faces Pain Scale: No hurt    Home Living Family/patient expects to be discharged to:: [facility])                      Prior Function Level of Independence: Needs assistance         Comments: Patient minimally communicative, unable to relay accurate information.  Per caregiver at [facility], patient ambulatory with +1 assist for in-room distances (to/from bathroom).     Hand Dominance        Extremity/Trunk Assessment   Upper Extremity Assessment Upper Extremity Assessment: Generalized weakness    Lower Extremity Assessment Lower Extremity Assessment: Generalized weakness(grossly at least 3/5 throughout)       Communication   Communication: (speech garbled and incomprehensible)  Cognition Arousal/Alertness: Awake/alert Behavior During Therapy: Restless Overall Cognitive Status: No family/caregiver present to determine baseline cognitive functioning                                 General Comments: unable to participate with  orientation assessment. Unable to follow verbal commands, but does respond to and follow manual facilitation, overhand guidance      General Comments      Exercises Other Exercises Other Exercises: Rolling bilat, mod/max assist, for management of incontinent bladder episode; dep for hygiene, clothing and linen change.  Patient does respond well to, and attempts to participate with, hand-over-hand facilitation; generally very motor restless, constantly grabbing at lines, clothing, linen.   Assessment/Plan    PT Assessment Patient needs continued PT services  PT Problem List Decreased strength;Decreased range of motion;Decreased activity tolerance;Decreased balance;Decreased mobility;Decreased coordination;Decreased cognition;Decreased knowledge of use of DME;Decreased safety awareness       PT Treatment Interventions DME instruction;Gait training;Functional mobility training;Therapeutic activities;Therapeutic exercise;Patient/family education    PT Goals (Current goals can be found in the Care Plan section)  Acute Rehab PT Goals PT Goal Formulation: Patient unable to participate in goal setting Time For Goal Achievement: 10/06/18 Potential to Achieve Goals: Fair    Frequency Min 2X/week   Barriers to discharge        Co-evaluation               AM-PAC PT "6 Clicks" Mobility  Outcome Measure Help needed turning from your back to your side while in a flat bed without using bedrails?: A Lot Help needed moving from lying on your back to sitting on the side of a flat bed without using bedrails?: A Lot Help needed moving to and from a bed to a chair (including a wheelchair)?: A Lot Help needed standing up from a chair using your arms (e.g., wheelchair or bedside chair)?: A Little Help needed to walk in hospital room?: A Lot Help needed climbing 3-5 steps with a railing? : Total 6 Click Score: 12    End of Session Equipment Utilized During Treatment: Gait belt Activity  Tolerance: Patient tolerated treatment well Patient left: in bed;with call bell/phone within reach;with bed alarm set   PT Visit Diagnosis: Difficulty in walking, not elsewhere classified (R26.2);Muscle weakness (generalized) (M62.81)    Time: 7262-0355 PT Time Calculation (min) (ACUTE ONLY): 21 min   Charges:   PT Evaluation $PT Eval Moderate Complexity: 1 Mod PT Treatments $Therapeutic Activity: 8-22 mins        Azzie Thiem H. Owens Shark, PT, DPT, NCS 09/22/18, 2:35 PM [phone]

## 2018-09-22 NOTE — Progress Notes (Signed)
[place] at [place] NAME: Patricia Foley    MR#:  [id number]  DATE OF BIRTH:  [birth year]  SUBJECTIVE: Patient is admitted because of poor p.o. intake, increased confusion and found to have hypokalemia.  Patient is from dementia unit,  CHIEF COMPLAINT:   Chief Complaint  Patient presents with  . Altered Mental Status  Hypokalemia  improved, has baseline confusion with underlying severe dementia.  REVIEW OF SYSTEMS:   Review of Systems  Unable to perform ROS: Dementia     DRUG ALLERGIES:   Allergies  Allergen Reactions  . Sulfa Antibiotics Hives  . Coumadin [Warfarin] Rash    VITALS:  Blood pressure 101/90, pulse 75, temperature 97.7 F (36.5 C), temperature source Oral, resp. rate 18, height 5\' 2"  (1.575 m), weight 59.5 kg, SpO2 93 %.  PHYSICAL EXAMINATION:  GENERAL:  85 y.o.-year-old patient lying in the bed with no acute distress.  EYES: Pupils equal, round, reactive to light  No scleral icterus.  HEENT: Head atraumatic, normocephalic. Oropharynx and nasopharynx clear.  NECK:  Supple, no jugular venous distention. No thyroid enlargement, no tenderness.  LUNGS: Normal breath sounds bilaterally, no wheezing, rales,rhonchi or crepitation. No use of accessory muscles of respiration.  CARDIOVASCULAR: S1, S2 normal. No murmurs, rubs, or gallops.  ABDOMEN: Soft, nontender, nondistended. Bowel sounds present. No organomegaly or mass.  EXTREMITIES: No pedal edema, cyanosis, or clubbing.  NEUROLOGIC: Unable to do full neuro exam because of dementia and not following commands. PSYCHIATRIC: The patient is alert and oriented x 3.  SKIN: Lower leg cellulitis with blisters, seen by wound care nurse, recommended Unna boots for chronic venous stasis.  Also recommended dressing changes weekly on Wednesday.  LABORATORY PANEL:   CBC Recent Labs  Lab 09/20/18 0458  WBC 5.5  HGB 9.8*  HCT 32.2*  PLT 180    ------------------------------------------------------------------------------------------------------------------  Chemistries  Recent Labs  Lab 09/19/18 1313  09/22/18 0615  NA 146*   < > 147*  K 2.7*   < > 3.3*  CL 95*   < > 100  CO2 38*   < > 36*  GLUCOSE 102*   < > 107*  BUN 20   < > 21  CREATININE 0.73   < > 0.54  CALCIUM 9.4   < > 9.1  MG  --    < > 2.0  AST 25  --   --   ALT 17  --   --   ALKPHOS 118  --   --   BILITOT 1.1  --   --    < > = values in this interval not displayed.   ------------------------------------------------------------------------------------------------------------------  Cardiac Enzymes No results for input(s): TROPONINI in the last 168 hours. ------------------------------------------------------------------------------------------------------------------  RADIOLOGY:  No results found.  EKG:   Orders placed or performed during the hospital encounter of 09/19/18  . EKG 12-Lead  . EKG 12-Lead    ASSESSMENT AND PLAN:   85 year old female patient from [facility] dementia unit comes because of increased agitation, combativeness and found to have severe hypokalemia. 1.  Severe  hypokalemia secondary to poor p.o. intake and also due to diuretics, diuretics are on hold.   continue to replace orally, IV as tolerated, she is on telemetry because of hypokalemia, hypokalemia improving, potassium 3.3 today, pharmacy is consulted. Severe Alzheimer's dementia ; consulted palliative care, family is considering hospice.    2.  Severe Alzheimer's dementia with agitation, combativeness episodes on and off, patient is  on Aricept, CODE STATUS DNR.    3..  Altered mental status with hypokalemia, CT head unremarkable patient had believed to be secondary to underlying dementia.  UA is negative for UTI.  Low suspicion for infection,   #3 .chronic atrial fibrillation, not on anticoagulation secondary to risk of falls, continue Cardizem. #4 history of  bradycardia in the ER but resolved now Likely discharge to [facility] memory care unit/hospice home by Monday. . All the records are reviewed and case discussed with Care Management/Social Workerr. Management plans discussed with the patient, family and they are in agreement.  CODE STATUS: DNR  TOTAL TIME TAKING CARE OF THIS PATIENT: 35 minutes.   POSSIBLE D/C IN 1-2 DAYS, DEPENDING ON CLINICAL CONDITION.   Epifanio Lesches M.D on 09/22/2018 at 1:59 PM  Between 7am to 6pm - Pager - [phone]  After 6pm go to [email or web address] - password [email or web address] [place] [facility]  Office  [phone]  CC: Primary care physician; Virginia Crews, MD   Note: This dictation was prepared with Dragon dictation along with smaller phrase technology. Any transcriptional errors that result from this process are unintentional.

## 2018-09-22 NOTE — Progress Notes (Signed)
Patient became very short of breath and restless. Patient has crackles in all four lung regions. Administered a duoneb and 40mg  of lasix after notifying Dr.Diamond of current changes. Adjusted room temperature. Patient's blood pressure was higher at 0245.

## 2018-09-22 NOTE — NC FL2 (Addendum)
[place] LEVEL OF CARE SCREENING TOOL     IDENTIFICATION  Patient Name: Patricia Foley Birthdate: 1931/09/12 Sex: female Admission Date (Current Location): 09/19/2018  [place] and [place] Number:  Engineering geologist and Address:  [facility], [address] [place], [place] [address]      Provider Number: [id number]  Attending Physician Name and Address:  Epifanio Lesches, MD  Relative Name and Phone Number:  Sherin Quarry [phone]    Current Level of Care: Hospital Recommended Level of Care: Memory Care Prior Approval Number:    Date Approved/Denied:   PASRR Number: [id number]  Discharge Plan: (Hopefully back to Memory Care with home health)    Current Diagnoses: Patient Active Problem List   Diagnosis Date Noted  . Goals of care, counseling/discussion   . Palliative care encounter   . Altered mental status 09/19/2018  . (HFpEF) heart failure with preserved ejection fraction ([place]) 08/23/2017  . Acute respiratory failure with hypoxia ([place]) 08/23/2017  . SOB (shortness of breath) 08/10/2017  . Nonhealing nonsurgical wound 05/11/2017  . Callus of foot 05/11/2017  . Urinary frequency 02/10/2017  . COPD (chronic obstructive pulmonary disease) ([place])   . Dementia ([place])   . GERD (gastroesophageal reflux disease)   . Hypertension   . Tremors of nervous system   . Hypothyroidism   . Atrial fibrillation ([place])   . HOH (hard of hearing)   . Anemia   . Arthritis   . Arthritis of ankle, right 05/28/2016  . Essential tremor 03/27/2016  . Chronic insomnia 03/27/2016  . Chronic venous insufficiency 10/04/2015  . Hemorrhoids without complication 66/07/3014  . Varicose veins of both legs with edema 09/07/2014    Orientation RESPIRATION BLADDER Height & Weight     (NO)  Normal Incontinent Weight: 59.5 kg Height:  5\' 2"  (157.5 cm)  BEHAVIORAL SYMPTOMS/MOOD NEUROLOGICAL BOWEL NUTRITION STATUS      Incontinent Diet   AMBULATORY STATUS COMMUNICATION OF NEEDS Skin   Limited Assist Verbally Skin abrasions                       Personal Care Assistance Level of Assistance  Bathing, Dressing, Feeding Bathing Assistance: Limited assistance Feeding assistance: Limited assistance Dressing Assistance: Maximum assistance     Functional Limitations Info             SPECIAL CARE FACTORS FREQUENCY  PT (By licensed PT)     PT Frequency: 2-3 x week              Contractures Contractures Info: Not present    Additional Factors Info  Code Status Code Status Info: DNR             Current Medications (09/22/2018):  This is the current hospital active medication list Current Facility-Administered Medications  Medication Dose Route Frequency Provider Last Rate Last Dose  . 0.9 %  sodium chloride infusion   Intravenous PRN Epifanio Lesches, MD 10 mL/hr at 09/21/18 2010 250 mL at 09/21/18 2010  . acetaminophen (TYLENOL) tablet 650 mg  650 mg Oral Q6H PRN Lang Snow, NP      . aspirin EC tablet 81 mg  81 mg Oral Daily Lang Snow, NP   81 mg at 09/22/18 1004  . digoxin (LANOXIN) tablet 125 mcg  125 mcg Oral Daily Lang Snow, NP   125 mcg at 09/22/18 1004  . diltiazem (CARDIZEM CD) 24 hr capsule 300 mg  300 mg Oral Daily Lang Snow, NP   300 mg at 09/22/18 1004  . enoxaparin (LOVENOX) injection 40 mg  40 mg Subcutaneous Q24H Lang Snow, NP   40 mg at 09/21/18 2135  . ferrous sulfate tablet 325 mg  325 mg Oral Q breakfast Lang Snow, NP   325 mg at 09/22/18 1003  . haloperidol lactate (HALDOL) injection 2 mg  2 mg Intravenous Q6H PRN Lance Coon, MD   2 mg at 09/22/18 0208  . hydrocortisone (ANUSOL-HC) 2.5 % rectal cream 1 application  1 application Topical U9W PRN Ouma, Bing Neighbors, NP      . ipratropium-albuterol (DUONEB) 0.5-2.5 (3) MG/3ML nebulizer solution 3 mL  3 mL Nebulization Q6H PRN Lang Snow, NP   3 mL at 09/22/18 0232  . lisinopril (ZESTRIL) tablet 10 mg  10 mg Oral Daily Lang Snow, NP   10 mg at 09/22/18 1003  . potassium chloride SA (K-DUR) CR tablet 20 mEq  20 mEq Oral Once Rowland Lathe, RPH      . QUEtiapine (SEROQUEL) tablet 50 mg  50 mg Oral BID Epifanio Lesches, MD   50 mg at 09/22/18 1003  . sodium chloride flush (NS) 0.9 % injection 3 mL  3 mL Intravenous Q12H Epifanio Lesches, MD   3 mL at 09/21/18 2136  . sodium chloride flush (NS) 0.9 % injection 3 mL  3 mL Intravenous PRN Epifanio Lesches, MD      . triamcinolone cream (KENALOG) 0.1 % 1 application  1 application Topical BID PRN Lang Snow, NP         Discharge Medications: Please see discharge summary for a list of discharge medications.  Relevant Imaging Results:  Relevant Lab Results:   Additional Information    Katrina Stack, RN

## 2018-09-23 LAB — CBC
HCT: 33.3 % — ABNORMAL LOW (ref 36.0–46.0)
Hemoglobin: 10 g/dL — ABNORMAL LOW (ref 12.0–15.0)
MCH: 28.7 pg (ref 26.0–34.0)
MCHC: 30 g/dL (ref 30.0–36.0)
MCV: 95.7 fL (ref 80.0–100.0)
Platelets: 205 10*3/uL (ref 150–400)
RBC: 3.48 MIL/uL — ABNORMAL LOW (ref 3.87–5.11)
RDW: 17.6 % — ABNORMAL HIGH (ref 11.5–15.5)
WBC: 8.3 10*3/uL (ref 4.0–10.5)
nRBC: 0 % (ref 0.0–0.2)

## 2018-09-23 LAB — BASIC METABOLIC PANEL
Anion gap: 10 (ref 5–15)
BUN: 25 mg/dL — ABNORMAL HIGH (ref 8–23)
CO2: 34 mmol/L — ABNORMAL HIGH (ref 22–32)
Calcium: 8.9 mg/dL (ref 8.9–10.3)
Chloride: 104 mmol/L (ref 98–111)
Creatinine, Ser: 0.52 mg/dL (ref 0.44–1.00)
GFR calc Af Amer: >60 mL/min (ref >60)
GFR calc non Af Amer: >60 mL/min (ref >60)
Glucose, Bld: 87 mg/dL (ref 70–99)
Potassium: 3.4 mmol/L — ABNORMAL LOW (ref 3.5–5.1)
Sodium: 148 mmol/L — ABNORMAL HIGH (ref 135–145)

## 2018-09-23 LAB — MAGNESIUM: Magnesium: 2.1 mg/dL (ref 1.7–2.4)

## 2018-09-23 MED ORDER — POTASSIUM CL IN DEXTROSE 5% 20 MEQ/L IV SOLN
20.0000 meq | INTRAVENOUS | Status: DC
  Administered 2018-09-23 – 2018-09-26 (×6): 20 meq via INTRAVENOUS
  Filled 2018-09-23 (×9): qty 1000

## 2018-09-23 MED ORDER — POTASSIUM CHLORIDE 20 MEQ PO PACK
20.0000 meq | PACK | Freq: Once | ORAL | Status: AC
  Administered 2018-09-23: 20 meq via ORAL
  Filled 2018-09-23: qty 1

## 2018-09-23 NOTE — Consult Note (Signed)
PHARMACY CONSULT NOTE - FOLLOW UP  Pharmacy Consult for Electrolyte Monitoring and Replacement   Recent Labs: Potassium (mmol/L)  Date Value  09/23/2018 3.4 (L)  01/08/2014 3.1 (L)   Magnesium (mg/dL)  Date Value  09/23/2018 2.1  05/22/2013 2.1   Calcium (mg/dL)  Date Value  09/23/2018 8.9   Calcium, Total (mg/dL)  Date Value  01/08/2014 8.7   Albumin (g/dL)  Date Value  09/19/2018 3.8  11/14/2017 4.4  05/21/2013 2.9 (L)   Phosphorus (mg/dL)  Date Value  09/22/2018 3.4   Sodium (mmol/L)  Date Value  09/23/2018 148 (H)  11/14/2017 140  01/08/2014 139  Corrected Ca: 9.46   Assessment: Patient presents with AMS and has hypokalemia. Potassium 20 mEq x1 dose was not given yesterday - per [facility] patient refused.    K 2.5 >>  2.9  --> rechecked @ 2145 = 3.6 >> 3.3 Scr 0.67 >> 0.65 >> 0.54  Goal of Therapy:  Electrolytes WNL.   Plan:  8/8 AM: K: 3.4. KCl 55mEq dose never given on 8/7.  Will order KCL 20 mEq x1 dose again today.   Will follow up electrolytes with AM labs.   Pernell Dupre, PharmD, BCPS Clinical Pharmacist 09/23/2018 6:49 AM

## 2018-09-23 NOTE — Plan of Care (Signed)
  Problem: Education: Goal: Knowledge of General Education information will improve Description: Including pain rating scale, medication(s)/side effects and non-pharmacologic comfort measures Outcome: Not Progressing Note: Patient lethargic and disoriented today. Will continue to monitor neurological status for the remainder of the shift. Wenda Low [facility]

## 2018-09-23 NOTE — Progress Notes (Signed)
[place] at [place] NAME: Posey Jasmin    MR#:  [id number]  DATE OF BIRTH:  January 09, 1932  SUBJECTIVE:   Patient presented to the hospital due to worsening mental status and noted to be severely hypokalemic.  Potassium level has improved.  Still remains altered and lethargic with poor p.o. intake.    Patient's grandson/POA is at bedside.  REVIEW OF SYSTEMS:    Review of Systems  Unable to perform ROS: Dementia    Nutrition: [place] Healthy/Carb modified Tolerating Diet: very little Tolerating PT:  bedbound at baseline.   DRUG ALLERGIES:   Allergies  Allergen Reactions  . Sulfa Antibiotics Hives  . Coumadin [Warfarin] Rash    VITALS:  Blood pressure (!) 142/53, pulse 66, temperature 98.6 F (37 C), temperature source Oral, resp. rate 18, height 5\' 2"  (1.575 m), weight 59.5 kg, SpO2 96 %.  PHYSICAL EXAMINATION:   Physical Exam  GENERAL:  85 y.o.-year-old demented, thin patient lying in bed in no acute distress.  EYES: Pupils equal, round, reactive to light and accommodation. No scleral icterus. Extraocular muscles intact.  HEENT: Head atraumatic, normocephalic. Dry oral Mucosa  NECK:  Supple, no jugular venous distention. No thyroid enlargement, no tenderness.  LUNGS: Poor REsp. effort, no wheezing, rales, rhonchi. No use of accessory muscles of respiration.  CARDIOVASCULAR: S1, S2 normal. No murmurs, rubs, or gallops.  ABDOMEN: Soft, nontender, nondistended. Bowel sounds present. No organomegaly or mass.  EXTREMITIES: No cyanosis, clubbing or edema b/l.    NEUROLOGIC: Cranial nerves II through XII are intact. No focal Motor or sensory deficits b/l. Globally weak.    PSYCHIATRIC: The patient is alert and oriented x 1.  SKIN: No obvious rash, lesion, or ulcer.    LABORATORY PANEL:   CBC Recent Labs  Lab 09/23/18 0546  WBC 8.3  HGB 10.0*  HCT 33.3*  PLT 205    ------------------------------------------------------------------------------------------------------------------  Chemistries  Recent Labs  Lab 09/19/18 1313  09/23/18 0546  NA 146*   < > 148*  K 2.7*   < > 3.4*  CL 95*   < > 104  CO2 38*   < > 34*  GLUCOSE 102*   < > 87  BUN 20   < > 25*  CREATININE 0.73   < > 0.52  CALCIUM 9.4   < > 8.9  MG  --    < > 2.1  AST 25  --   --   ALT 17  --   --   ALKPHOS 118  --   --   BILITOT 1.1  --   --    < > = values in this interval not displayed.   ------------------------------------------------------------------------------------------------------------------  Cardiac Enzymes No results for input(s): TROPONINI in the last 168 hours. ------------------------------------------------------------------------------------------------------------------  RADIOLOGY:  No results found.   ASSESSMENT AND PLAN:   85 year old female with past medical history of dementia, hypertension, hypothyroidism, COPD, atrial fibrillation who presented to the hospital due to altered mental status and also noted to be hypokalemic.  1. Hypokalemia/Hypernatremia - due to dehydration and poor PO intake.  -Improving with IV fluid hydration and supplementation. -We will continue to monitor.  2.  Altered mental status/dementia-patient continues to have episodes of sundowning and agitation overnight.  Remains somewhat subdued today. -Continue Aricept, prognosis is poor to guarded given patient's dementia and poor p.o. intake. -Continue to encourage p.o. intake and grandson will attempt to feed patient. -Continue IV fluids.  3.  History of chronic atrial fibrillation- rate controlled.  Continue Cardizem, digoxin.  4.  COPD-no acute exacerbation-continue duo nebs as needed.  5.  Essential hypertension-continue lisinopril.  Given patient's advanced dementia and poor p.o. intake her prognosis is poor.  Patient is a DNR.  Discussed with grandson about possibility  of hospice services/palliative care to follow at the memory care.  Patient's grandson to further discuss with family members and decide.  All the records are reviewed and case discussed with Care Management/Social Worker. Management plans discussed with the patient, family and they are in agreement.  CODE STATUS: DNR  DVT Prophylaxis: Lovenox  TOTAL TIME TAKING CARE OF THIS PATIENT: 30 minutes.   POSSIBLE D/C IN 2-3 DAYS, DEPENDING ON CLINICAL CONDITION.   [name] M.D on 09/23/2018 at 3:49 PM  Between 7am to 6pm - Pager - [phone]  After 6pm go to [email or web address] - Proofreader  [facility] [place] [facility]  Office  [phone]  CC: Primary care physician; Virginia Crews, MD

## 2018-09-23 NOTE — Progress Notes (Signed)
Spoke to Dr. Marcille Blanco concerning SBP 171 and was instructed to give morning dose of cardizem now.

## 2018-09-24 LAB — CULTURE, BLOOD (ROUTINE X 2)
Culture: NO GROWTH
Culture: NO GROWTH
Special Requests: ADEQUATE
Special Requests: ADEQUATE

## 2018-09-24 LAB — BASIC METABOLIC PANEL
Anion gap: 8 (ref 5–15)
BUN: 23 mg/dL (ref 8–23)
CO2: 32 mmol/L (ref 22–32)
Calcium: 9 mg/dL (ref 8.9–10.3)
Chloride: 105 mmol/L (ref 98–111)
Creatinine, Ser: 0.58 mg/dL (ref 0.44–1.00)
GFR calc Af Amer: >60 mL/min (ref >60)
GFR calc non Af Amer: >60 mL/min (ref >60)
Glucose, Bld: 103 mg/dL — ABNORMAL HIGH (ref 70–99)
Potassium: 3.7 mmol/L (ref 3.5–5.1)
Sodium: 145 mmol/L (ref 135–145)

## 2018-09-24 MED ORDER — DOCUSATE SODIUM 100 MG PO CAPS
100.0000 mg | ORAL_CAPSULE | Freq: Two times a day (BID) | ORAL | Status: DC
  Administered 2018-09-24 – 2018-09-25 (×3): 100 mg via ORAL
  Filled 2018-09-24 (×3): qty 1

## 2018-09-24 NOTE — Progress Notes (Addendum)
Patient became combative when the NT was taking her blood pressure cuff off.  Resulting in two skin tears over large bruise on her left forearm.Cleansed covered with Mediptel, transparent film. Then foam to protect it. One tear measures 5 x 1.5 cm, the other is 3 x 0.5 cm.  Widths are measured at the widest point.

## 2018-09-24 NOTE — Progress Notes (Signed)
Patricia Foley was here for several hours this afternoon.  He says he's talk with other family members and the general consensus is she should go to hospice.  He says she never wanted invasive or extraordinary measures to extend her life

## 2018-09-24 NOTE — Consult Note (Signed)
PHARMACY CONSULT NOTE - FOLLOW UP  Pharmacy Consult for Electrolyte Monitoring and Replacement   Recent Labs: Potassium (mmol/L)  Date Value  09/24/2018 3.7  01/08/2014 3.1 (L)   Magnesium (mg/dL)  Date Value  09/23/2018 2.1  05/22/2013 2.1   Calcium (mg/dL)  Date Value  09/24/2018 9.0   Calcium, Total (mg/dL)  Date Value  01/08/2014 8.7   Albumin (g/dL)  Date Value  09/19/2018 3.8  11/14/2017 4.4  05/21/2013 2.9 (L)   Phosphorus (mg/dL)  Date Value  09/22/2018 3.4   Sodium (mmol/L)  Date Value  09/24/2018 145  11/14/2017 140  01/08/2014 139  Corrected Ca: 9.46   Assessment: Patient presents with AMS and has hypokalemia. Potassium 20 mEq x1 dose was not given yesterday - per [facility] patient refused.     Goal of Therapy:  Electrolytes WNL.   Plan:  8/9 AM: K: 3.7  Scr 0.58 Patient on D5 w/ 20 meq KCL/L at 75 ml/hr. No additional replacement at this time  Will follow up electrolytes with AM labs.   Noralee Space, PharmD, BCPS Clinical Pharmacist 09/24/2018 9:03 AM

## 2018-09-24 NOTE — Progress Notes (Signed)
[place] at [place] NAME: Patricia Foley    MR#:  [id number]  DATE OF BIRTH:  [birth year]  SUBJECTIVE:   Patient remains confused and altered, no acute events overnight.  P.o. intake remains poor.  REVIEW OF SYSTEMS:    Review of Systems  Unable to perform ROS: Dementia    Nutrition: Hearth Healthy/Carb modified Tolerating Diet: very little Tolerating PT:  bedbound at baseline.   DRUG ALLERGIES:   Allergies  Allergen Reactions  . Sulfa Antibiotics Hives  . Coumadin [Warfarin] Rash    VITALS:  Blood pressure (!) 169/80, pulse 73, temperature (!) 97.4 F (36.3 C), temperature source Oral, resp. rate 16, height 5\' 2"  (1.575 m), weight 59.5 kg, SpO2 91 %.  PHYSICAL EXAMINATION:   Physical Exam  GENERAL:  85 y.o.-year-old demented, thin patient lying in bed in no acute distress.  EYES: Pupils equal, round, reactive to light. No scleral icterus. Extraocular muscles intact.  HEENT: Head atraumatic, normocephalic. Dry oral Mucosa  NECK:  Supple, no jugular venous distention. No thyroid enlargement, no tenderness.  LUNGS: Poor REsp. effort, no wheezing, rales, rhonchi. No use of accessory muscles of respiration.  CARDIOVASCULAR: S1, S2 normal. No murmurs, rubs, or gallops.  ABDOMEN: Soft, nontender, nondistended. Bowel sounds present. No organomegaly or mass.  EXTREMITIES: No cyanosis, clubbing or edema b/l.    NEUROLOGIC: Cranial nerves II through XII are intact. No focal Motor or sensory deficits b/l. Globally weak.    PSYCHIATRIC: The patient is alert and oriented x 1.  SKIN: No obvious rash, lesion, or ulcer.    LABORATORY PANEL:   CBC Recent Labs  Lab 09/23/18 0546  WBC 8.3  HGB 10.0*  HCT 33.3*  PLT 205   ------------------------------------------------------------------------------------------------------------------  Chemistries  Recent Labs  Lab 09/19/18 1313  09/23/18 0546 09/24/18 0547  NA 146*   < >  148* 145  K 2.7*   < > 3.4* 3.7  CL 95*   < > 104 105  CO2 38*   < > 34* 32  GLUCOSE 102*   < > 87 103*  BUN 20   < > 25* 23  CREATININE 0.73   < > 0.52 0.58  CALCIUM 9.4   < > 8.9 9.0  MG  --    < > 2.1  --   AST 25  --   --   --   ALT 17  --   --   --   ALKPHOS 118  --   --   --   BILITOT 1.1  --   --   --    < > = values in this interval not displayed.   ------------------------------------------------------------------------------------------------------------------  Cardiac Enzymes No results for input(s): TROPONINI in the last 168 hours. ------------------------------------------------------------------------------------------------------------------  RADIOLOGY:  No results found.   ASSESSMENT AND PLAN:   85 year old female with past medical history of dementia, hypertension, hypothyroidism, COPD, atrial fibrillation who presented to the hospital due to altered mental status and also noted to be hypokalemic.  1. Hypokalemia/Hypernatremia - due to dehydration and poor PO intake.  -Improved and resolved with supplementation.   2.  Altered mental status/dementia-patient continues to have episodes of sundowning and agitation overnight.  -Continue Aricept, prognosis is poor to guarded given patient's dementia and poor p.o. intake. - cont. IV fluids.   3.  History of chronic atrial fibrillation- rate controlled.  Continue Cardizem, digoxin.  4.  COPD-no acute exacerbation-continue duo nebs as needed.  5.  Essential hypertension-continue lisinopril.  Given patient's advanced dementia and poor p.o. intake her prognosis is poor.  Patient is a DNR.  Discussed with grandson yesterday about possibility of hospice services/palliative care to follow at the memory care.  Patient's grandson to further discuss with family members and decide.  All the records are reviewed and case discussed with Care Management/Social Worker. Management plans discussed with the patient, family and  they are in agreement.  CODE STATUS: DNR  DVT Prophylaxis: Lovenox  TOTAL TIME TAKING CARE OF THIS PATIENT: 25 minutes.   POSSIBLE D/C IN 2-3 DAYS, DEPENDING ON CLINICAL CONDITION.   [name] M.D on 09/24/2018 at 2:55 PM  Between 7am to 6pm - Pager - [phone]  After 6pm go to [email or web address] - Proofreader  [facility] [place] [facility]  Office  [phone]  CC: Primary care physician; Virginia Crews, MD

## 2018-09-25 LAB — BASIC METABOLIC PANEL
Anion gap: 7 (ref 5–15)
BUN: 17 mg/dL (ref 8–23)
CO2: 31 mmol/L (ref 22–32)
Calcium: 9.1 mg/dL (ref 8.9–10.3)
Chloride: 102 mmol/L (ref 98–111)
Creatinine, Ser: 0.52 mg/dL (ref 0.44–1.00)
GFR calc Af Amer: >60 mL/min (ref >60)
GFR calc non Af Amer: >60 mL/min (ref >60)
Glucose, Bld: 119 mg/dL — ABNORMAL HIGH (ref 70–99)
Potassium: 4 mmol/L (ref 3.5–5.1)
Sodium: 140 mmol/L (ref 135–145)

## 2018-09-25 MED ORDER — LORAZEPAM 2 MG/ML IJ SOLN
0.5000 mg | INTRAMUSCULAR | Status: DC | PRN
  Administered 2018-09-26 – 2018-09-27 (×2): 0.5 mg via INTRAVENOUS
  Filled 2018-09-25 (×2): qty 1

## 2018-09-25 MED ORDER — BISACODYL 10 MG RE SUPP: 10.0000 mg | Freq: Every day | RECTAL | Status: DC | PRN

## 2018-09-25 MED ORDER — MORPHINE SULFATE (CONCENTRATE) 10 MG/0.5ML PO SOLN: 5.0000 mg | ORAL | Status: DC | PRN

## 2018-09-25 NOTE — TOC Progression Note (Signed)
Transition of Care [facility]) - Progression Note    Patient Details  Name: Patricia Foley MRN: [id number] Date of Birth: [birth year]  Transition of Care [facility]) CM/SW Contact  Ross Ludwig, [place] Phone Number: 09/25/2018, 10:58 AM  Clinical Narrative:     CSW spoke with patient's grandson Octavia Bruckner [phone] and provided choice of hospice facilities, he chose [facility] in [place].  CSW was informed by patient's grandson, that he is the [facility] and patient's niece [name] [phone] is the financial power of attorney.  CSW gave referral to nurse liasion Santiago Glad, and she stated there are no beds available today.  CSW to continue to follow patient's progress throughout discharge planning.   Expected Discharge Plan: (Unsure) Barriers to Discharge: Continued Medical Work up  Expected Discharge Plan and Services Expected Discharge Plan: (Unsure)       Living arrangements for the past 2 months: Assisted Living Facility                                       Social Determinants of Health (SDOH) Interventions    Readmission Risk Interventions No flowsheet data found.

## 2018-09-25 NOTE — Progress Notes (Signed)
Palliative:  HPI: 85 y.o. female  with past medical history of dementia, chronic venous stasis (with bilateral leg edema and skin breakdown/weeping), CHF, atrial fibrillation, HTN, COPD, hypothyroidism admitted on 09/19/2018 with altered mental status with hypokalemia after increase in Lasix. Electrolytes have been corrected but patient remains IVF dependent.   I followed up with Ms. Mohiuddin. She arouses more easily but is confused, agitated, and difficult to understand with no appropriate verbal response. Still not eating or drinking. Sleeping all the time.   I called and spoke with her HCPOA, grandson Tim. I followed up on our conversation and Octavia Bruckner understands that his grandmother is not improving. We discuss hospice and comfort care. He knows that she does not desire for her life to be prolonged without quality and she would not want to continue in this state. We discussed even IVF as a life prolonging measure in her state. He agrees. He does not desire that she return to SNF but would like to pursue placement in hospice facility. He understands that care at hospice will be for comfort and IVF will be stopped. We discussed that her time may be very limited and expected prognosis < 2 weeks. All questions/concerns addressed. Emotional support provided.   Exam: Elderly, frail. No distress. Hunched over in bed. Confused and agitated at times. Mittens on hands. Breathing regular, unlabored. Generalized weakness. Bilateral legs wrapped with unna boot.   Plan: - Pursue hospice placement - Comfort care - Continue IVF while hospitalized for now  54 min  Vinie Sill, NP Palliative Medicine Team Pager [phone] (Please see [email or web address] for schedule) Team Phone [phone]    Greater than 50%  of this time was spent counseling and coordinating care related to the above assessment and plan   The above conversation was completed via telephone due to the visitor restrictions during the COVID-19 pandemic.  Thorough chart review and discussion with necessary members of the care team was completed as part of assessment.

## 2018-09-25 NOTE — Care Management Important Message (Signed)
Important Message  Patient Details  Name: Patricia Foley MRN: [id number] Date of Birth: Dec 12, 1931   Medicare Important Message Given:  Yes     Juliann Pulse A Tracy Kinner 09/25/2018, 2:43 PM

## 2018-09-25 NOTE — Consult Note (Signed)
PHARMACY CONSULT NOTE - FOLLOW UP  Pharmacy Consult for Electrolyte Monitoring and Replacement   Recent Labs: Potassium (mmol/L)  Date Value  09/25/2018 4.0  01/08/2014 3.1 (L)   Magnesium (mg/dL)  Date Value  09/23/2018 2.1  05/22/2013 2.1   Calcium (mg/dL)  Date Value  09/25/2018 9.1   Calcium, Total (mg/dL)  Date Value  01/08/2014 8.7   Albumin (g/dL)  Date Value  09/19/2018 3.8  11/14/2017 4.4  05/21/2013 2.9 (L)   Phosphorus (mg/dL)  Date Value  09/22/2018 3.4   Sodium (mmol/L)  Date Value  09/25/2018 140  11/14/2017 140  01/08/2014 139  Corrected Ca: 9.26   Assessment: Patient presents with AMS and has hypokalemia. Potassium 20 mEq x1 dose was not given yesterday - per [facility] patient refused.     Goal of Therapy:  Electrolytes WNL.   Plan:  8/9 AM: K: 3.7  Scr 0.58 Patient on D5 w/ 20 meq KCL/L at 75 ml/hr. No additional replacement at this time  8/10 AM: K 4.0 Scr 0.52 Patient on D5 w/ 20 meq KCL/L at 75 ml/hr. No additional replacement at this time Will follow up electrolytes with AM labs.   Rowland Lathe, PharmD Clinical Pharmacist 09/25/2018 7:19 AM

## 2018-09-25 NOTE — Progress Notes (Signed)
[place] at [place] NAME: Sevanna Ballengee    MR#:  [id number]  DATE OF BIRTH:  01-04-1932  SUBJECTIVE:   No acute events overnight, encephalopathic/lethargic with poor p.o. intake.  REVIEW OF SYSTEMS:    Review of Systems  Unable to perform ROS: Dementia    Nutrition: Hearth Healthy/Carb modified Tolerating Diet: very little Tolerating PT:  bedbound at baseline.   DRUG ALLERGIES:   Allergies  Allergen Reactions  . Sulfa Antibiotics Hives  . Coumadin [Warfarin] Rash    VITALS:  Blood pressure (!) 160/80, pulse 67, temperature 97.6 F (36.4 C), resp. rate 20, height 5\' 2"  (1.575 m), weight 59.5 kg, SpO2 99 %.  PHYSICAL EXAMINATION:   Physical Exam  GENERAL:  85 y.o.-year-old demented, thin patient lying in bed in no acute distress.  EYES: Pupils equal, round, reactive to light. No scleral icterus. Extraocular muscles intact.  HEENT: Head atraumatic, normocephalic. Dry oral Mucosa  NECK:  Supple, no jugular venous distention. No thyroid enlargement, no tenderness.  LUNGS: Poor REsp. effort, no wheezing, rales, rhonchi. No use of accessory muscles of respiration.  CARDIOVASCULAR: S1, S2 normal. No murmurs, rubs, or gallops.  ABDOMEN: Soft, nontender, nondistended. Bowel sounds present. No organomegaly or mass.  EXTREMITIES: No cyanosis, clubbing or edema b/l.    NEUROLOGIC: Cranial nerves II through XII are intact. No focal Motor or sensory deficits b/l. Globally weak.    PSYCHIATRIC: The patient is alert and oriented x 1.  SKIN: No obvious rash, lesion, or ulcer.    LABORATORY PANEL:   CBC Recent Labs  Lab 09/23/18 0546  WBC 8.3  HGB 10.0*  HCT 33.3*  PLT 205   ------------------------------------------------------------------------------------------------------------------  Chemistries  Recent Labs  Lab 09/19/18 1313  09/23/18 0546  09/25/18 0410  NA 146*   < > 148*   < > 140  K 2.7*   < > 3.4*   < >  4.0  CL 95*   < > 104   < > 102  CO2 38*   < > 34*   < > 31  GLUCOSE 102*   < > 87   < > 119*  BUN 20   < > 25*   < > 17  CREATININE 0.73   < > 0.52   < > 0.52  CALCIUM 9.4   < > 8.9   < > 9.1  MG  --    < > 2.1  --   --   AST 25  --   --   --   --   ALT 17  --   --   --   --   ALKPHOS 118  --   --   --   --   BILITOT 1.1  --   --   --   --    < > = values in this interval not displayed.   ------------------------------------------------------------------------------------------------------------------  Cardiac Enzymes No results for input(s): TROPONINI in the last 168 hours. ------------------------------------------------------------------------------------------------------------------  RADIOLOGY:  No results found.   ASSESSMENT AND PLAN:   85 year old female with past medical history of dementia, hypertension, hypothyroidism, COPD, atrial fibrillation who presented to the hospital due to altered mental status and also noted to be hypokalemic.  1. Hypokalemia/Hypernatremia - due to dehydration and poor PO intake.  -Improved and resolved with supplementation.   2.  Altered mental status/dementia-patient continues to have episodes of sundowning and agitation overnight.  -Continue Aricept, prognosis is  poor to guarded given patient's dementia and poor p.o. intake.  3.  History of chronic atrial fibrillation- rate controlled.  Continue Cardizem, digoxin.  4.  COPD-no acute exacerbation-continue duo nebs as needed.  5.  Essential hypertension-continue lisinopril.  Patient has advanced dementia with failure to thrive and severe malnutrition.  Palliative care is following the patient.  They had a discussion with the patient's grandson who is the POA.  They are leaning towards possible comfort care/hospice home.  Social work has been notified.  Continue supportive care for now.  All the records are reviewed and case discussed with Care Management/Social Worker. Management plans  discussed with the patient, family and they are in agreement.  CODE STATUS: DNR  DVT Prophylaxis: Lovenox  TOTAL TIME TAKING CARE OF THIS PATIENT: 25 minutes.   POSSIBLE D/C IN 1-2 DAYS to Hospice Home depending on bed availability.    [name] M.D on 09/25/2018 at 3:49 PM  Between 7am to 6pm - Pager - [phone]  After 6pm go to [email or web address] - Proofreader  [facility] [place] [facility]  Office  [phone]  CC: Primary care physician; Virginia Crews, MD

## 2018-09-25 NOTE — Plan of Care (Signed)
  Problem: Education: Goal: Knowledge of General Education information will improve Description: Including pain rating scale, medication(s)/side effects and non-pharmacologic comfort measures Outcome: Not Progressing Note: Patient is now comfort care only / hospice bound. Will continue to monitor overall d/c progression for the remainder of the shift. Wenda Low [facility]

## 2018-09-25 NOTE — Progress Notes (Signed)
New hospice home referral received from [facility]. Randall Hiss informed that there is currently no bed available for transport today. Patient information faxed to referral. Writer to contact family to provide education regarding hospice home services.  Flo Shanks BSN, RN, [facility] hospice [phone]

## 2018-09-25 NOTE — Progress Notes (Signed)
PT Cancellation Note  Patient Details Name: Patricia Foley MRN: [id number] DOB: [birth year]   Cancelled Treatment:    Reason Eval/Treat Not Completed: Other (comment).  PT order cancelled by Palliative Care NP Vinie Sill.  Per chart, pt now comfort care and pursing hospice placement.    Leitha Bleak, PT 09/25/18, 12:20 PM [phone]

## 2018-09-26 MED ORDER — GLYCOPYRROLATE 0.2 MG/ML IJ SOLN
0.4000 mg | Freq: Three times a day (TID) | INTRAMUSCULAR | Status: DC
  Administered 2018-09-26 (×2): 0.4 mg via INTRAVENOUS
  Filled 2018-09-26 (×4): qty 2

## 2018-09-26 MED ORDER — POTASSIUM CL IN DEXTROSE 5% 20 MEQ/L IV SOLN
20.0000 meq | INTRAVENOUS | Status: DC
  Filled 2018-09-26: qty 1000

## 2018-09-26 NOTE — Progress Notes (Signed)
[place] at [place] NAME: Patricia Foley    MR#:  [id number]  DATE OF BIRTH:  March 04, 1931  SUBJECTIVE:   She transitioned to comfort care only yesterday.  Appears comfortable, no other acute events overnight.  REVIEW OF SYSTEMS:    Review of Systems  Unable to perform ROS: Dementia   DRUG ALLERGIES:   Allergies  Allergen Reactions  . Sulfa Antibiotics Hives  . Coumadin [Warfarin] Rash    VITALS:  Blood pressure (!) 162/62, pulse 70, temperature 100 F (37.8 C), temperature source Oral, resp. rate 16, height 5\' 2"  (1.575 m), weight 59.5 kg, SpO2 98 %.  PHYSICAL EXAMINATION:   Physical Exam  GENERAL:  85 y.o.-year-old demented, thin patient lying in bed lethargic/sedated EYES:  No scleral icterus.  HEENT: Head atraumatic, normocephalic. Dry oral Mucosa  NECK:  Supple, no jugular venous distention. No thyroid enlargement, no tenderness.  LUNGS: Poor REsp. effort, no wheezing, rales, rhonchi. No use of accessory muscles of respiration.  CARDIOVASCULAR: S1, S2 normal. No murmurs, rubs, or gallops.  ABDOMEN: Soft, nontender, nondistended. Bowel sounds present. No organomegaly or mass.  EXTREMITIES: No cyanosis, clubbing or edema b/l.    NEUROLOGIC: Lethargic/Encephalopathic PSYCHIATRIC: Lethargic/Encephalopathic.  SKIN: No obvious rash, lesion, or ulcer.    LABORATORY PANEL:   CBC Recent Labs  Lab 09/23/18 0546  WBC 8.3  HGB 10.0*  HCT 33.3*  PLT 205   ------------------------------------------------------------------------------------------------------------------  Chemistries  Recent Labs  Lab 09/23/18 0546  09/25/18 0410  NA 148*   < > 140  K 3.4*   < > 4.0  CL 104   < > 102  CO2 34*   < > 31  GLUCOSE 87   < > 119*  BUN 25*   < > 17  CREATININE 0.52   < > 0.52  CALCIUM 8.9   < > 9.1  MG 2.1  --   --    < > = values in this interval not displayed.    ------------------------------------------------------------------------------------------------------------------  Cardiac Enzymes No results for input(s): TROPONINI in the last 168 hours. ------------------------------------------------------------------------------------------------------------------  RADIOLOGY:  No results found.   ASSESSMENT AND PLAN:   85 year old female with past medical history of dementia, hypertension, hypothyroidism, COPD, atrial fibrillation who presented to the hospital due to altered mental status and also noted to be hypokalemic.  1. Hypokalemia/Hypernatremia - due to dehydration and poor PO intake.  -Improved and resolved with supplementation.   2.  Altered mental status/dementia--prognosis is poor to guarded given patient's dementia and poor p.o. intake.  3.  History of chronic atrial fibrillation  4.  COPD  5.  Essential hypertension  Patient has advanced dementia with failure to thrive and severe malnutrition.  Palliative care consult discussed with family and patient was transitioned to comfort care only yesterday. -Continue Ativan, morphine as needed for comfort.  Continue glycopyrrolate. -Plan to discharge to hospice home tomorrow.  All the records are reviewed and case discussed with Care Management/Social Worker. Management plans discussed with the patient, family and they are in agreement.  CODE STATUS: DNR  DVT Prophylaxis: Lovenox  TOTAL TIME TAKING CARE OF THIS PATIENT: 25 minutes.   POSSIBLE D/C IN 1-2 DAYS to Hospice Home depending on bed availability.    [name] M.D on 09/26/2018 at 3:09 PM  Between 7am to 6pm - Pager - [phone]  After 6pm go to [email or web address] - [employer] [facility]  Office  [phone]  CC: Primary care physician; Virginia Crews, MD

## 2018-09-26 NOTE — Progress Notes (Signed)
Visit made to new hospice home referral. Patient seen lying in bed, alert, speech difficult to understand. Face washed. Patient appeared to be sleeping after this.Per chart note review patient continues with only bites and sips. Trace edema noted to hands, IV fluids continue. Telephone calls to patient's grandson Octavia Bruckner and niece [name] last evening to initiate education regarding hospice services, philosophy, team approach to care and current visitation policy. Both were also made aware that at this time to to [place] [facility], hospice is unable to offer  in person volunteer visits are not available and that televisits are available on request. Both voiced understanding. Phone call made to [facility] today to notify that a bed will be available tomorrow. Hospital care team updated as well. Plan is for transport via EMS tomorrow to the hospice home with signed DNR in place. Will continue to follow through discharge. Flo Shanks BSN, RN, [facility] [phone]

## 2018-09-26 NOTE — TOC Progression Note (Signed)
Transition of Care [facility]) - Progression Note    Patient Details  Name: Patricia Foley MRN: [id number] Date of Birth: 14-Oct-1931  Transition of Care [facility]) CM/SW Contact  Shade Flood, LCSW Phone Number: 09/26/2018, 12:00 PM  Clinical Narrative:     TOC following. Discussed pt with Santiago Glad from [facility] this AM. Per Santiago Glad, they are expecting to admit pt to the Hospice Residence tomorrow, 8/12. Santiago Glad updated MD and pt's RN as well as family. TOC will follow up tomorrow to assist as needed with pt's dc to hospice.  Expected Discharge Plan: (Unsure) Barriers to Discharge: Continued Medical Work up  Expected Discharge Plan and Services Expected Discharge Plan: (Unsure)       Living arrangements for the past 2 months: Assisted Living Facility                                       Social Determinants of Health (SDOH) Interventions    Readmission Risk Interventions No flowsheet data found.

## 2018-09-26 NOTE — Progress Notes (Signed)
Patient transferred to room 112. Report given to [facility] RN on 1C. Patient's belongings gathered and taken with her. Bed alarm on staff notified of patient's arrival.

## 2018-09-26 NOTE — Progress Notes (Signed)
Continuing comfort care measures. Discontinued oxygen per order, IVF still infusing. Patient asleep most of the morning taking sips of water and tea. Will continue to monitor patient.

## 2018-09-26 NOTE — Progress Notes (Signed)
Palliative:  HPI:85 y.o.femalewith past medical history of dementia, chronic venous stasis (with bilateral leg edema and skin breakdown/weeping), CHF, atrial fibrillation, HTN, COPD, hypothyroidismadmitted on 8/4/2020with altered mental status with hypokalemia after increase in Lasix.Electrolytes have been corrected but patient remains IVF dependent. IVF stopped today with HCPOA permission.   Patricia Foley continues in hospital awaiting hospice bed. She appears comfortable but continues with IVF and becoming a little wet. I called and spoke with grandson/HCPOA, Patricia Foley. Patricia Foley understands her progression. She continues not eating or drinking. We discussed comfort care and I recommended to stop IVF and Patricia Foley agrees. He agrees with full comfort care and reaffirms that she would not want this QOL and would not want to live this way. He understands that transition to hospice should be tomorrow. He has no further questions/concerns. Emotional support provided.   Exam: Unresponsive. Breathing regular, unlabored but wet/congested. Drool from mouth. Abd soft. Unna boot to bilat lower extremities. Warm to touch.   Plan: - To hospice tomorrow.  - D/C IVF. Robinul added 0.4 mg IV TID.  - PRN medications in place to ensure comfort.  - Full comfort care. Prognosis < 2 weeks.   [place], NP Palliative Medicine Team Pager [phone] (Please see [email or web address] for schedule) Team Phone [phone]    Greater than 50%  of this time was spent counseling and coordinating care related to the above assessment and plan   The above conversation was completed via telephone due to the visitor restrictions during the COVID-19 pandemic. Thorough chart review and discussion with necessary members of the care team was completed as part of assessment.

## 2018-09-26 NOTE — Plan of Care (Signed)
  Problem: Education: Goal: Knowledge of General Education information will improve Description: Including pain rating scale, medication(s)/side effects and non-pharmacologic comfort measures Outcome: Progressing   Problem: Health Behavior/Discharge Planning: Goal: Ability to manage health-related needs will improve Outcome: Progressing   Problem: Clinical Measurements: Goal: Ability to maintain clinical measurements within normal limits will improve Outcome: Progressing Goal: Will remain free from infection Outcome: Progressing Goal: Diagnostic test results will improve Outcome: Progressing Goal: Respiratory complications will improve Outcome: Progressing Goal: Cardiovascular complication will be avoided Outcome: Progressing   Problem: Activity: Goal: Risk for activity intolerance will decrease Outcome: Progressing   Problem: Nutrition: Goal: Adequate nutrition will be maintained Outcome: Progressing   Problem: Coping: Goal: Level of anxiety will decrease Outcome: Progressing   Problem: Skin Integrity: Goal: Risk for impaired skin integrity will decrease Outcome: Progressing   

## 2018-09-27 DIAGNOSIS — F0390 Unspecified dementia without behavioral disturbance: Secondary | ICD-10-CM

## 2018-09-27 MED ORDER — MORPHINE SULFATE (CONCENTRATE) 10 MG/0.5ML PO SOLN: 5.0000 mg | ORAL | Status: AC | PRN

## 2018-09-27 MED ORDER — LORAZEPAM 2 MG/ML IJ SOLN: 0.5000 mg | INTRAMUSCULAR | 0 refills | Status: AC | PRN

## 2018-09-27 MED ORDER — GLYCOPYRROLATE 0.2 MG/ML IJ SOLN
0.2000 mg | Freq: Three times a day (TID) | INTRAMUSCULAR | Status: DC
  Administered 2018-09-27: 0.2 mg via INTRAVENOUS
  Filled 2018-09-27 (×3): qty 1

## 2018-09-27 MED ORDER — GLYCOPYRROLATE 0.2 MG/ML IJ SOLN: 0.2000 mg | Freq: Three times a day (TID) | INTRAMUSCULAR | Status: AC

## 2018-09-27 NOTE — Progress Notes (Signed)
Follow up visit made to new [facility]. Patient seen lying in bed, eyes closed, breathing regular. She received a PRN dose of IV lorazepam at 4:30 this am per chart note review for anxiety. Plan is for transfer to the hospice home today via EMS. Signed out of facility DNR in place in discharge packet. Hospital care team updated. EMS notified for transport. Discharge summary faxed to referral. Flo Shanks BSN, RN, [facility] [phone]

## 2018-09-27 NOTE — Discharge Summary (Signed)
[facility] [facility] NAME: Patricia Foley    MR#:  [id number]  DATE OF BIRTH:  Jun 13, 1931  DATE OF ADMISSION:  09/19/2018 ADMITTING PHYSICIAN: Lang Snow, NP  DATE OF DISCHARGE: 09/27/2018  PRIMARY CARE PHYSICIAN: Virginia Crews, MD    ADMISSION DIAGNOSIS:  Hypokalemia [E87.6] Altered mental status, unspecified altered mental status type [R41.82]  DISCHARGE DIAGNOSIS:  Active Problems:   Altered mental status   Goals of care, counseling/discussion   Palliative care encounter   SECONDARY DIAGNOSIS:   Past Medical History:  Diagnosis Date  . Anemia   . Arthritis   . Atrial fibrillation ([place])   . Circulation problem   . COPD (chronic obstructive pulmonary disease) ([place])   . Cough    CHRONIC  . Dementia ([place])   . GERD (gastroesophageal reflux disease)   . HOH (hard of hearing)   . Hypertension   . Hypothyroidism   . Tremors of nervous system   . Venous insufficiency     HOSPITAL COURSE:   85 year old female with past medical history of dementia, hypertension, hypothyroidism, COPD, atrial fibrillation who presented to the hospital due to altered mental status and also noted to be hypokalemic.  1. Hypokalemia/Hypernatremia - due to dehydration and poor PO intake.  - this improved and resolved with supplementation.   2.  Altered mental status - due to advanced dementia 3.  History of chronic atrial fibrillation 4. COPD 5.  Essential hypertension 6. Adult Failure to Thrive.   Was admitted to the hospital and treated for her metabolic encephalopathy and her potassium level was replaced.  Her mental status continued to wax and wane and she had very poor p.o. intake.  Given her advanced dementia and failure to thrive palliative care consult was obtained to discuss goals of care.  After discussions of palliative care with the patient's POA who is the grandson they agreed to make the patient comfort care  only. -Patient is currently on morphine, Ativan and glycopyrrolate under comfort care only.  She is now being discharged to hospice home for further care.    DISCHARGE CONDITIONS:   Fair  CONSULTS OBTAINED:    DRUG ALLERGIES:   Allergies  Allergen Reactions  . Sulfa Antibiotics Hives  . Coumadin [Warfarin] Rash    DISCHARGE MEDICATIONS:   Allergies as of 09/27/2018      Reactions   Sulfa Antibiotics Hives   Coumadin [warfarin] Rash      Medication List    STOP taking these medications   acetaminophen 325 MG tablet Commonly known as: TYLENOL   albuterol 108 (90 Base) MCG/ACT inhaler Commonly known as: VENTOLIN HFA   aspirin 81 MG EC tablet   digoxin 0.125 MG tablet Commonly known as: LANOXIN   diltiazem 300 MG 24 hr capsule Commonly known as: CARDIZEM CD   donepezil 10 MG tablet Commonly known as: ARICEPT   ferrous sulfate 325 (65 FE) MG tablet   furosemide 20 MG tablet Commonly known as: LASIX   furosemide 40 MG tablet Commonly known as: LASIX   ipratropium-albuterol 0.5-2.5 (3) MG/3ML Soln Commonly known as: DUONEB   Klor-Con M20 20 MEQ tablet Generic drug: potassium chloride SA   lisinopril 10 MG tablet Commonly known as: ZESTRIL   naproxen sodium 220 MG tablet Commonly known as: ALEVE   Proctosol HC 2.5 % rectal cream Generic drug: hydrocortisone   QUEtiapine 50 MG tablet Commonly known as: SEROQUEL   triamcinolone cream 0.1 % Commonly  known as: KENALOG     TAKE these medications   glycopyrrolate 0.2 MG/ML injection Commonly known as: ROBINUL Inject 1 mL (0.2 mg total) into the vein 3 (three) times daily.   LORazepam 2 MG/ML injection Commonly known as: ATIVAN Inject 0.25 mLs (0.5 mg total) into the vein every 4 (four) hours as needed for anxiety.   morphine CONCENTRATE 10 MG/0.5ML Soln concentrated solution Place 0.25 mLs (5 mg total) under the tongue every 2 (two) hours as needed for severe pain or shortness of breath.          DISCHARGE INSTRUCTIONS:   DIET:  NPO  DISCHARGE CONDITION:  Fair  ACTIVITY:  Activity as tolerated  OXYGEN:  Home Oxygen: No.   Oxygen Delivery: room air  DISCHARGE LOCATION:  Hospice Home   If you experience worsening of your admission symptoms, develop shortness of breath, life threatening emergency, suicidal or homicidal thoughts you must seek medical attention immediately by calling 911 or calling your MD immediately  if symptoms less severe.  You Must read complete instructions/literature along with all the possible adverse reactions/side effects for all the Medicines you take and that have been prescribed to you. Take any new Medicines after you have completely understood and accpet all the possible adverse reactions/side effects.   Please note  You were cared for by a hospitalist during your hospital stay. If you have any questions about your discharge medications or the care you received while you were in the hospital after you are discharged, you can call the unit and asked to speak with the hospitalist on call if the hospitalist that took care of you is not available. Once you are discharged, your primary care physician will handle any further medical issues. Please note that NO REFILLS for any discharge medications will be authorized once you are discharged, as it is imperative that you return to your primary care physician (or establish a relationship with a primary care physician if you do not have one) for your aftercare needs so that they can reassess your need for medications and monitor your lab values.     Today   No acute events overnight, patient remains sedated/encephalopathic/lethargic.  VITAL SIGNS:  Blood pressure (!) 195/75, pulse 91, temperature (!) 97.4 F (36.3 C), temperature source Axillary, resp. rate 17, height 5\' 2"  (1.575 m), weight 70.3 kg, SpO2 (!) 82 %.  I/O:    Intake/Output Summary (Last 24 hours) at 09/27/2018 1012 Last data  filed at 09/26/2018 1625 Gross per 24 hour  Intake 553.41 ml  Output 50 ml  Net 503.41 ml    PHYSICAL EXAMINATION:   GENERAL:  85 y.o.-year-old demented, thin patient lying in bed lethargic/sedated EYES:  No scleral icterus.  HEENT: Head atraumatic, normocephalic. Dry oral Mucosa  NECK:  Supple, no jugular venous distention. No thyroid enlargement, no tenderness.  LUNGS: Poor REsp. effort, no wheezing, rales, rhonchi. No use of accessory muscles of respiration.  CARDIOVASCULAR: S1, S2 normal. No murmurs, rubs, or gallops.  ABDOMEN: Soft, nontender, nondistended. Bowel sounds present. No organomegaly or mass.  EXTREMITIES: No cyanosis, clubbing or edema b/l.    Lower extremities wrapped in Unna boots.  NEUROLOGIC: Lethargic/Encephalopathic PSYCHIATRIC: Lethargic/Encephalopathic.  SKIN: No obvious rash, lesion, or ulcer.   DATA REVIEW:   CBC Recent Labs  Lab 09/23/18 0546  WBC 8.3  HGB 10.0*  HCT 33.3*  PLT 205    Chemistries  Recent Labs  Lab 09/23/18 0546  09/25/18 0410  NA 148*   < >  140  K 3.4*   < > 4.0  CL 104   < > 102  CO2 34*   < > 31  GLUCOSE 87   < > 119*  BUN 25*   < > 17  CREATININE 0.52   < > 0.52  CALCIUM 8.9   < > 9.1  MG 2.1  --   --    < > = values in this interval not displayed.    Cardiac Enzymes No results for input(s): TROPONINI in the last 168 hours.  Microbiology Results  Results for orders placed or performed during the hospital encounter of 09/19/18  CULTURE, BLOOD (ROUTINE X 2) w Reflex to ID Panel     Status: None   Collection Time: 09/19/18  3:50 PM   Specimen: BLOOD  Result Value Ref Range Status   Specimen Description BLOOD BLOOD LEFT FOREARM  Final   Special Requests   Final    BOTTLES DRAWN AEROBIC AND ANAEROBIC Blood Culture adequate volume   Culture   Final    NO GROWTH 5 DAYS Performed at [facility], [address] [place], [place] [address]    Report Status 09/24/2018 FINAL  Final  CULTURE, BLOOD  (ROUTINE X 2) w Reflex to ID Panel     Status: None   Collection Time: 09/19/18  3:55 PM   Specimen: BLOOD  Result Value Ref Range Status   Specimen Description BLOOD RIGHT ANTECUBITAL  Final   Special Requests   Final    BOTTLES DRAWN AEROBIC AND ANAEROBIC Blood Culture adequate volume   Culture   Final    NO GROWTH 5 DAYS Performed at [facility], [address] [place], [place] [address]    Report Status 09/24/2018 FINAL  Final  SARS CORONAVIRUS 2 Nasal Swab Aptima Multi Swab     Status: None   Collection Time: 09/19/18  4:29 PM   Specimen: Aptima Multi Swab; Nasal Swab  Result Value Ref Range Status   SARS Coronavirus 2 NEGATIVE NEGATIVE Final    Comment: (NOTE) SARS-CoV-2 target nucleic acids are NOT DETECTED. The SARS-CoV-2 RNA is generally detectable in upper and lower respiratory specimens during the acute phase of infection. Negative results do not preclude SARS-CoV-2 infection, do not rule out co-infections with other pathogens, and should not be used as the sole basis for treatment or other patient management decisions. Negative results must be combined with clinical observations, patient history, and epidemiological information. The expected result is Negative. Fact Sheet for Patients: SugarRoll.be Fact Sheet for Healthcare Providers: https://www.woods-mathews.com/ This test is not yet approved or cleared by the [place] FDA and  has been authorized for detection and/or diagnosis of SARS-CoV-2 by FDA under an Emergency Use Authorization (EUA). This EUA will remain  in effect (meaning this test can be used) for the duration of the COVID-19 declaration under Section 56 4(b)(1) of the Act, 21 U.S.C. section 360bbb-3(b)(1), unless the authorization is terminated or revoked sooner. Performed at [facility], [place] [address] [place], [place] [address]   MRSA PCR Screening     Status: None   Collection  Time: 09/20/18 12:53 PM   Specimen: Nasal Mucosa; Nasopharyngeal  Result Value Ref Range Status   MRSA by PCR NEGATIVE NEGATIVE Final    Comment:        The GeneXpert MRSA Assay (FDA approved for NASAL specimens only), is one component of a comprehensive MRSA colonization surveillance program. It is not intended to diagnose MRSA  infection nor to guide or monitor treatment for MRSA infections. Performed at [facility], [address] [place], [place] [address]     RADIOLOGY:  No results found.    Management plans discussed with the patient, family and they are in agreement.  CODE STATUS:     Code Status Orders  (From admission, onward)         Start     Ordered   09/19/18 1549  Do not attempt resuscitation (DNR)  Continuous    Question Answer Comment  In the event of cardiac or respiratory ARREST Do not call a "code blue"   In the event of cardiac or respiratory ARREST Do not perform Intubation, CPR, defibrillation or ACLS   In the event of cardiac or respiratory ARREST Use medication by any route, position, wound care, and other measures to relive pain and suffering. May use oxygen, suction and manual treatment of airway obstruction as needed for comfort.      09/19/18 1549          TOTAL TIME TAKING CARE OF THIS PATIENT: 40 minutes.    [name] M.D on 09/27/2018 at 10:12 AM  Between 7am to 6pm - Pager - [phone]  After 6pm go to [email or web address] - Proofreader  [facility]  Office  [phone]  CC: Primary care physician; Virginia Crews, MD

## 2018-09-27 NOTE — Care Management Important Message (Signed)
Important Message  Patient Details  Name: Patricia Foley MRN: [id number] Date of Birth: 1931/03/02   Medicare Important Message Given:  Yes     [name] 09/27/2018, 11:17 AM

## 2018-09-27 NOTE — Plan of Care (Signed)
  Problem: Education: Goal: Knowledge of General Education information will improve Description: Including pain rating scale, medication(s)/side effects and non-pharmacologic comfort measures Outcome: Adequate for Discharge   Problem: Health Behavior/Discharge Planning: Goal: Ability to manage health-related needs will improve Outcome: Adequate for Discharge   Problem: Clinical Measurements: Goal: Ability to maintain clinical measurements within normal limits will improve Outcome: Adequate for Discharge Goal: Will remain free from infection Outcome: Adequate for Discharge Goal: Diagnostic test results will improve Outcome: Adequate for Discharge Goal: Respiratory complications will improve Outcome: Adequate for Discharge Goal: Cardiovascular complication will be avoided Outcome: Adequate for Discharge   Problem: Activity: Goal: Risk for activity intolerance will decrease Outcome: Adequate for Discharge   Problem: Nutrition: Goal: Adequate nutrition will be maintained Outcome: Adequate for Discharge   Problem: Coping: Goal: Level of anxiety will decrease Outcome: Adequate for Discharge   Problem: Skin Integrity: Goal: Risk for impaired skin integrity will decrease Outcome: Adequate for Discharge

## 2018-09-27 NOTE — Progress Notes (Signed)
Palliative:  HPI: 85 y.o.femalewith past medical history of dementia, chronic venous stasis (with bilateral leg edema and skin breakdown/weeping), CHF, atrial fibrillation, HTN, COPD, hypothyroidismadmitted on 8/4/2020with altered mental status with hypokalemia after increase in Lasix. Electrolytes have been corrected but patient remains IVF dependent. IVF stopped and pursue full comfort care.   I met again with Patricia Foley. She appears comfortable this morning. No secretions and breathing is regular and unlabored. No apnea noted. Extremities are warm to touch with no coolness or mottling (lower legs wrapped with unna boot). Confirmed full comfort care with grandson yesterday. She appears stable for transition to hospice facility today.   Exam: Unresponsive. Breathing regular, unlabored, dry. No apnea. Abd soft. Warm to touch. No mottling.   Plan: - To hospice today.  - Full comfort care.   15 min  Vinie Sill, NP Palliative Medicine Team Pager [phone] (Please see [email or web address] for schedule) Team Phone [phone]    Greater than 50%  of this time was spent counseling and coordinating care related to the above assessment and plan

## 2018-09-27 NOTE — TOC Transition Note (Signed)
Transition of Care [facility]) - CM/SW Discharge Note   Patient Details  Name: CINTHYA BORS MRN: [id number] Date of Birth: March 26, 1931  [facility] CM/SW Contact:  Shelbie Hutching, RN Phone Number: 09/27/2018, 10:00 AM   Clinical Narrative:    Patient will discharge to the hospice home in [place] today.     Final next level of care: [place] Barriers to Discharge: Barriers Resolved   Patient Goals and CMS Choice Patient states their goals for this hospitalization and ongoing recovery are:: Unable to state CMS Medicare.gov Compare Post Acute Care list provided to:: Patient Represenative (must comment) Choice offered to / list presented to : (Already Followed by [facility])  Discharge Placement              Patient chooses bed at: Other - please specify in the comment section below:([facility]) Patient to be transferred to facility by: [facility] Name of family member notified: Octavia Bruckner and Crystal Lawns Patient and family notified of of transfer: 09/27/18  Discharge Plan and Services                                     Social Determinants of Health (SDOH) Interventions     Readmission Risk Interventions No flowsheet data found.

## 2018-10-13 ENCOUNTER — Other Ambulatory Visit: Payer: Self-pay | Admitting: Family Medicine

## 2018-10-17 DEATH — deceased

## 2018-12-23 IMAGING — CR DG CHEST 2V
2 series · 2 of 2 positions shown · non-contrast
Comparison: 07/20/2014

CLINICAL DATA: Nonproductive cough x3 days. Hx of A-fib, COPD, HTN.
Former smoker.

EXAM:
CHEST - 2 VIEW

[chest lat]
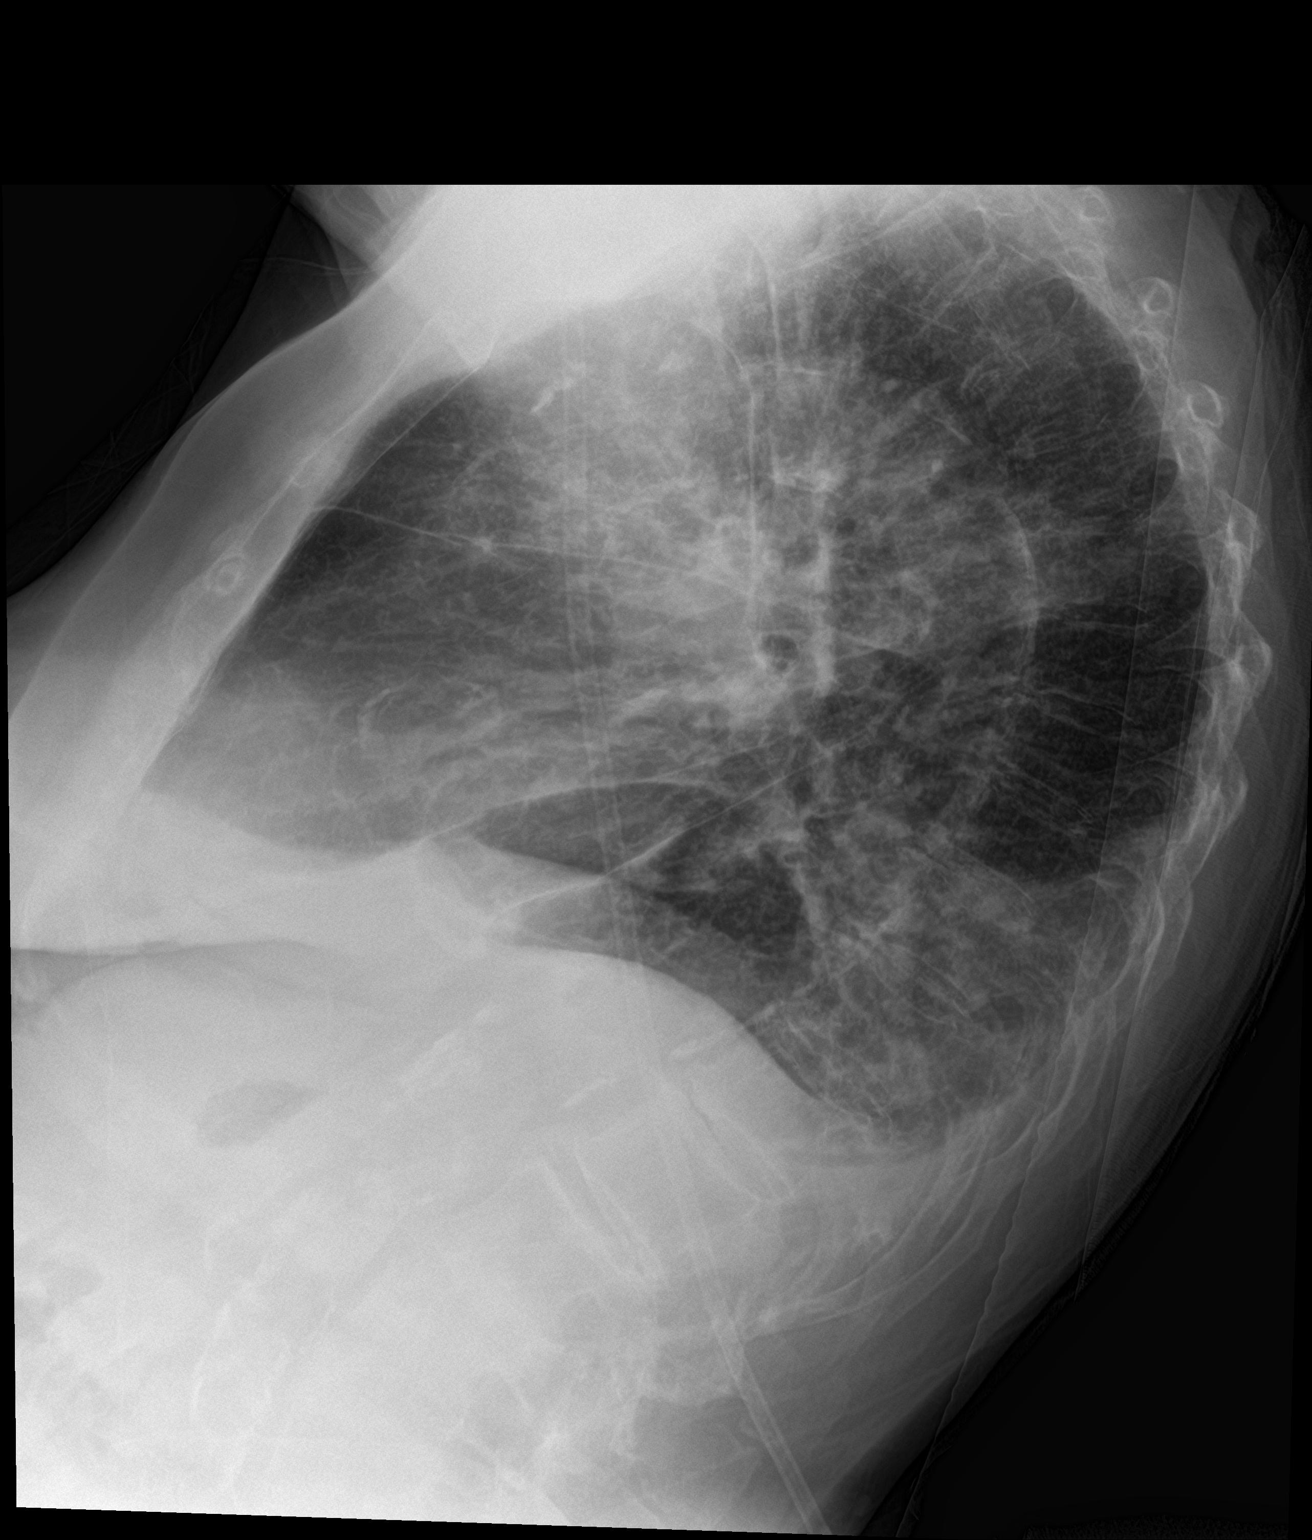

[chest ap]
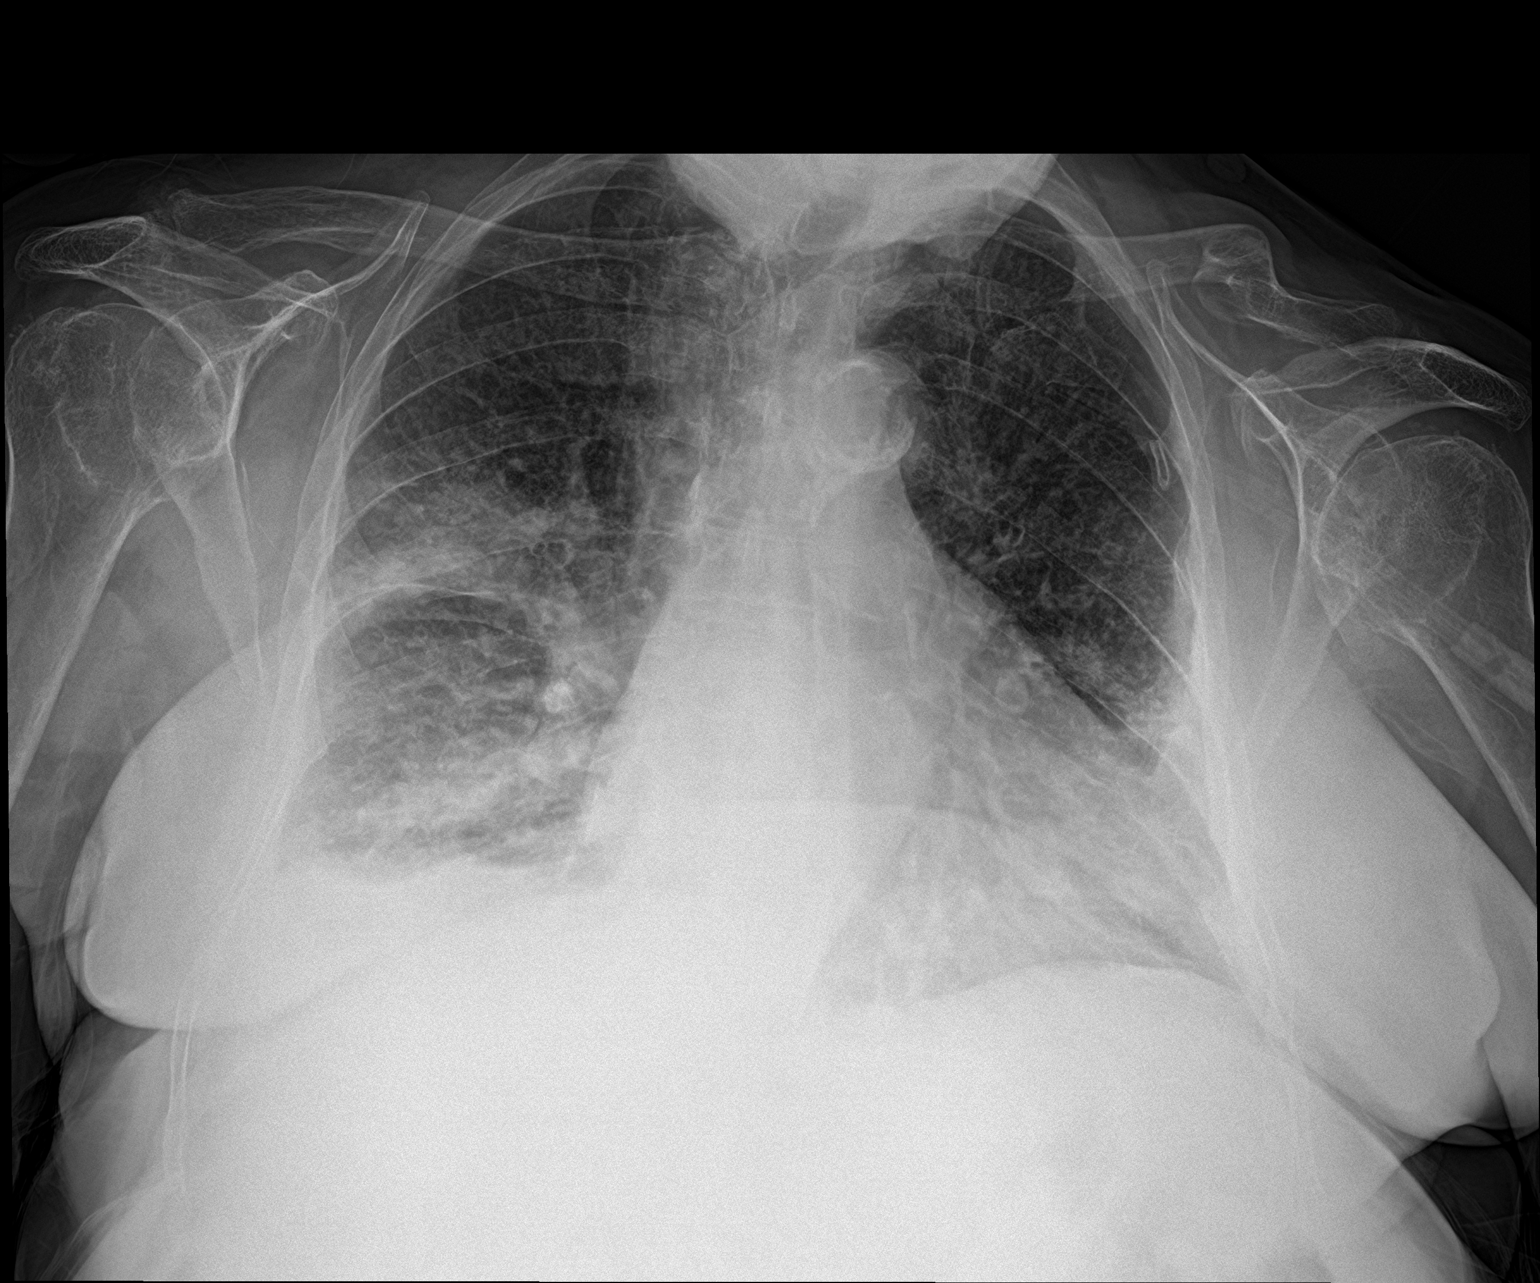

[2 of 2 positions shown; findings below may reference images not displayed]

FINDINGS: The heart is enlarged. There are patchy infiltrates in the lung
bases bilaterally. Small bilateral pleural effusions are present.
There is mild pulmonary vascular congestion. Remote fracture of the
LEFT clavicle. There is dense atherosclerotic calcification of the
aorta.
IMPRESSION: Cardiomegaly and bilateral airspace filling opacities/bilateral
pleural effusions.

Findings are consistent with pulmonary edema. Infectious process
could have a similar appearance.

Aortic atherosclerosis.  ([id number])

## 2018-12-25 IMAGING — CR DG CHEST 2V
2 series · 2 of 2 positions shown · non-contrast
Comparison: 08/10/2017 and earlier

CLINICAL DATA: Hypoxia today. Hx of A-fib, COPD, HTN.

EXAM:
CHEST - 2 VIEW

[chest lat]
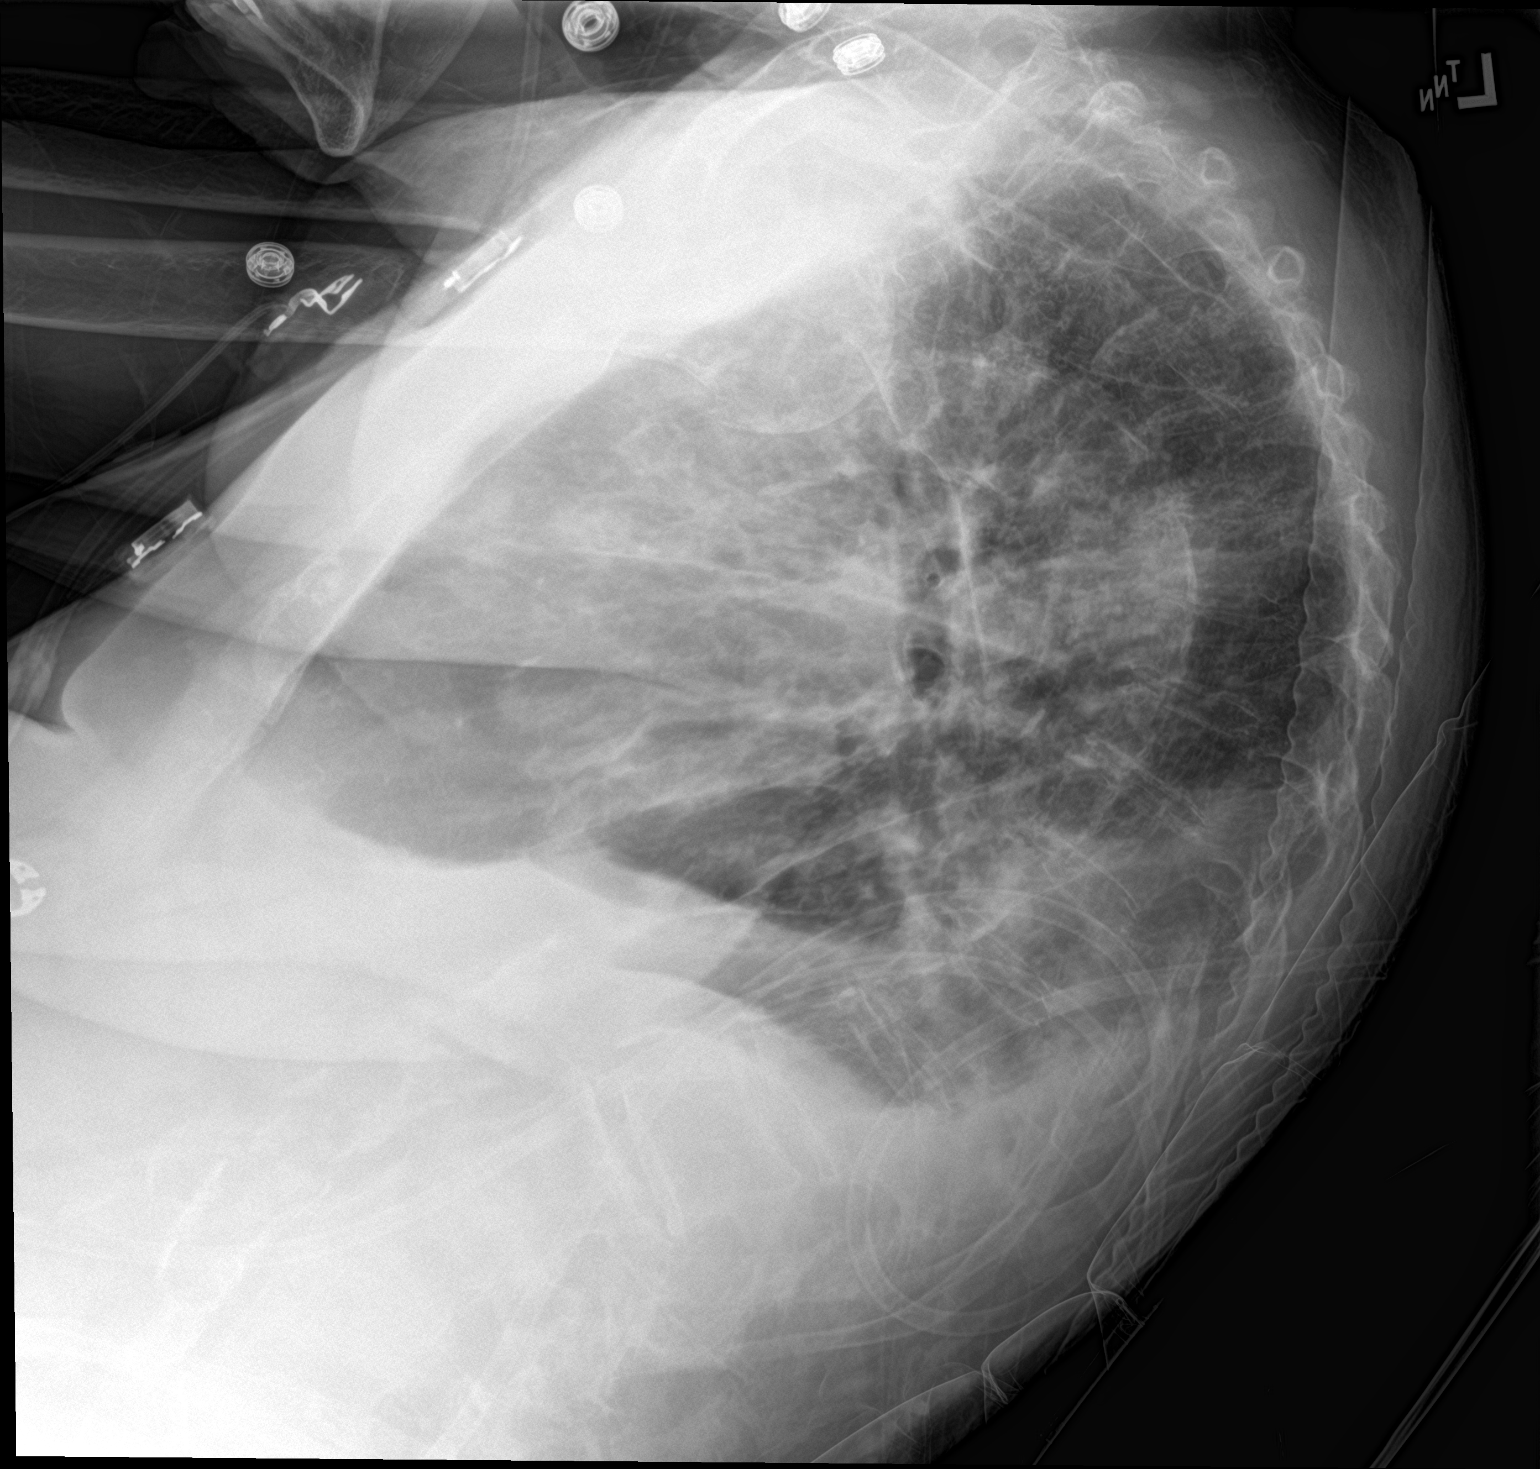

[chest ap]
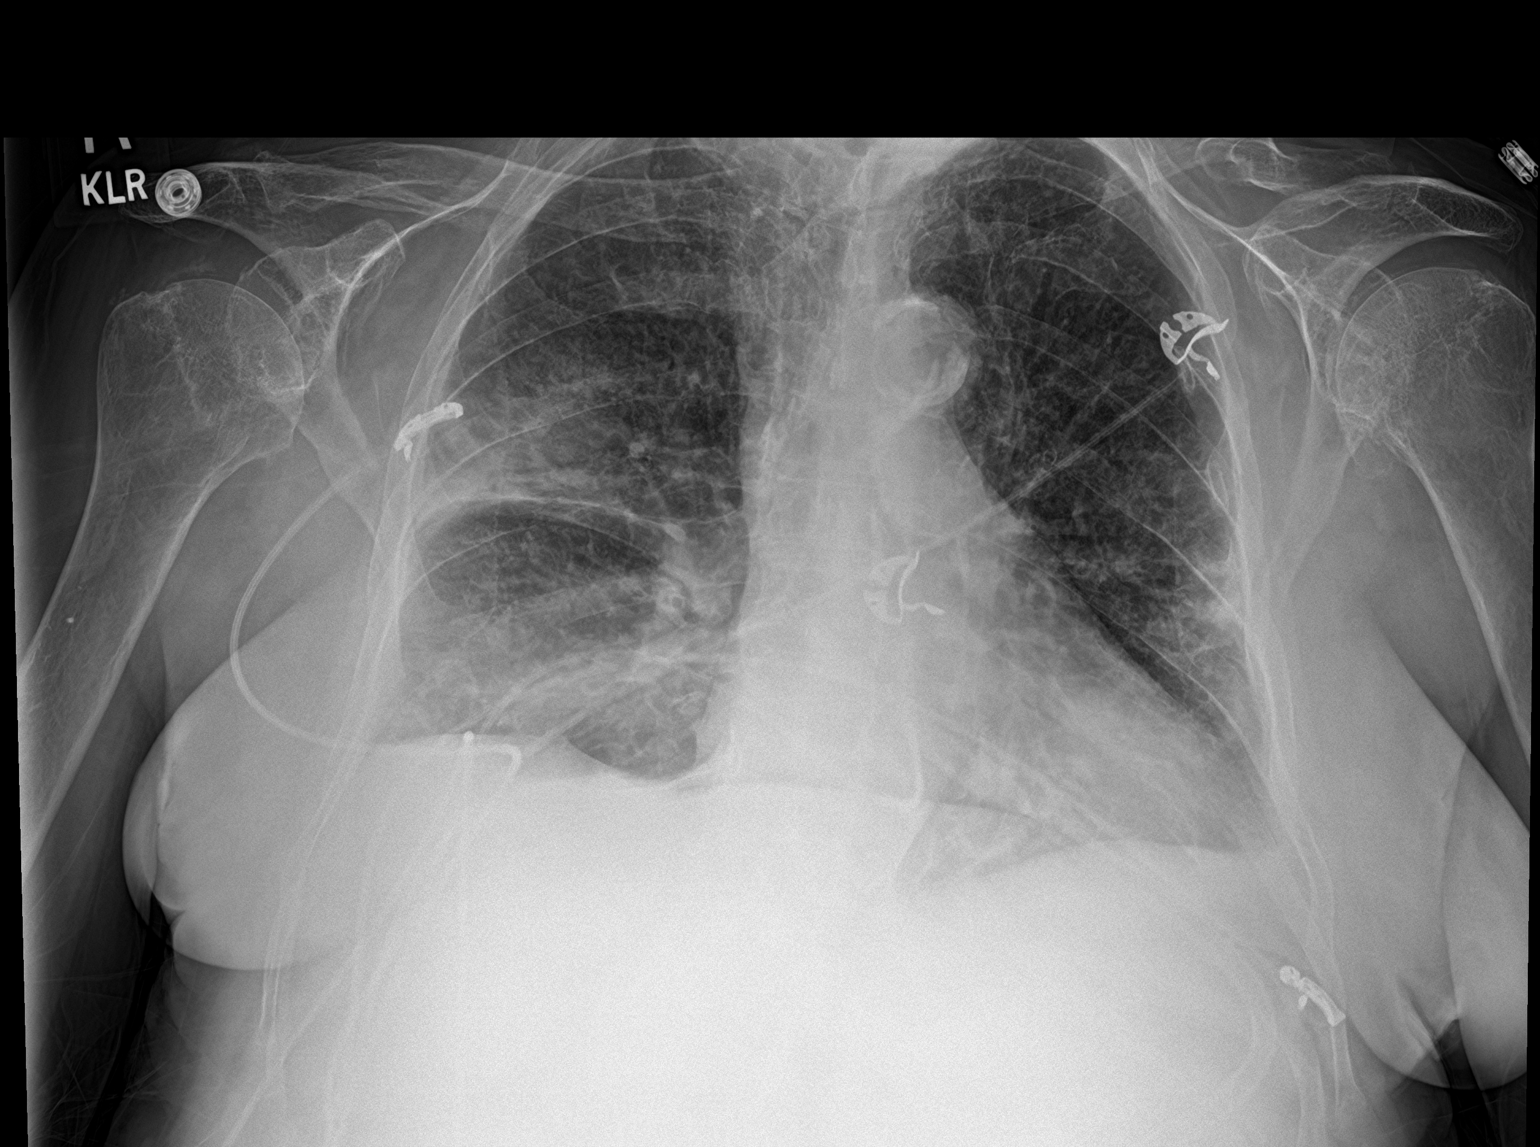

[2 of 2 positions shown; findings below may reference images not displayed]

FINDINGS: The heart is mildly enlarged. There are patchy opacities in the
RIGHT UPPER lobe and RIGHT LOWER lobe and to lesser degree the LEFT
LOWER lobe. There are small bilateral pleural effusions. The
thoracic aorta is densely calcified. Remote LEFT clavicle fracture.
IMPRESSION: Persistent bilateral infiltrates consistent with edema and/or
infectious process.
